# Patient Record
Sex: Male | Born: 1945 | Race: Black or African American | Hispanic: No | Smoking: Former smoker
Health system: Southern US, Community
[De-identification: ages and names within clinical notes are randomized; demographics above are authoritative.]

## PROBLEM LIST (undated history)

## (undated) DIAGNOSIS — K219 Gastro-esophageal reflux disease without esophagitis: Secondary | ICD-10-CM

## (undated) DIAGNOSIS — I10 Essential (primary) hypertension: Secondary | ICD-10-CM

## (undated) DIAGNOSIS — E7800 Pure hypercholesterolemia, unspecified: Secondary | ICD-10-CM

## (undated) DIAGNOSIS — I639 Cerebral infarction, unspecified: Secondary | ICD-10-CM

## (undated) DIAGNOSIS — N400 Enlarged prostate without lower urinary tract symptoms: Secondary | ICD-10-CM

## (undated) HISTORY — PX: NO PAST SURGERIES: SHX2092

---

## 2005-02-18 ENCOUNTER — Encounter: Admission: RE | Admit: 2005-02-18 | Discharge: 2005-02-18 | Payer: Self-pay | Admitting: Internal Medicine

## 2013-09-27 ENCOUNTER — Ambulatory Visit: Payer: Self-pay | Admitting: Gastroenterology

## 2016-06-06 ENCOUNTER — Encounter (HOSPITAL_COMMUNITY): Payer: Self-pay

## 2016-06-06 ENCOUNTER — Emergency Department (HOSPITAL_COMMUNITY)
Admission: EM | Admit: 2016-06-06 | Discharge: 2016-06-06 | Disposition: A | Discharge status: Home / Self Care | Payer: Medicare HMO | Attending: Emergency Medicine | Admitting: Emergency Medicine

## 2016-06-06 DIAGNOSIS — R112 Nausea with vomiting, unspecified: Secondary | ICD-10-CM | POA: Diagnosis present

## 2016-06-06 DIAGNOSIS — Z79899 Other long term (current) drug therapy: Secondary | ICD-10-CM | POA: Diagnosis not present

## 2016-06-06 DIAGNOSIS — I10 Essential (primary) hypertension: Secondary | ICD-10-CM | POA: Insufficient documentation

## 2016-06-06 HISTORY — DX: Essential (primary) hypertension: I10

## 2016-06-06 LAB — CBC
HCT: 45.8 % (ref 39.0–52.0)
Hemoglobin: 14.9 g/dL (ref 13.0–17.0)
MCH: 29.4 pg (ref 26.0–34.0)
MCHC: 32.5 g/dL (ref 30.0–36.0)
MCV: 90.3 fL (ref 78.0–100.0)
Platelets: 292 10*3/uL (ref 150–400)
RBC: 5.07 MIL/uL (ref 4.22–5.81)
RDW: 13.9 % (ref 11.5–15.5)
WBC: 9 10*3/uL (ref 4.0–10.5)

## 2016-06-06 LAB — COMPREHENSIVE METABOLIC PANEL
ALT: 29 U/L (ref 17–63)
AST: 27 U/L (ref 15–41)
Albumin: 3.8 g/dL (ref 3.5–5.0)
Alkaline Phosphatase: 60 U/L (ref 38–126)
Anion gap: 11 (ref 5–15)
BUN: 10 mg/dL (ref 6–20)
CHLORIDE: 104 mmol/L (ref 101–111)
CO2: 25 mmol/L (ref 22–32)
Calcium: 9.8 mg/dL (ref 8.9–10.3)
Creatinine, Ser: 1.27 mg/dL — ABNORMAL HIGH (ref 0.61–1.24)
GFR calc Af Amer: >60 mL/min (ref >60)
GFR, EST NON AFRICAN AMERICAN: 56 mL/min — AB (ref >60)
Glucose, Bld: 112 mg/dL — ABNORMAL HIGH (ref 65–99)
Potassium: 3.9 mmol/L (ref 3.5–5.1)
SODIUM: 140 mmol/L (ref 135–145)
Total Bilirubin: 0.7 mg/dL (ref 0.3–1.2)
Total Protein: 7.5 g/dL (ref 6.5–8.1)

## 2016-06-06 LAB — URINALYSIS, ROUTINE W REFLEX MICROSCOPIC
Bilirubin Urine: NEGATIVE
GLUCOSE, UA: NEGATIVE mg/dL
HGB URINE DIPSTICK: NEGATIVE
KETONES UR: NEGATIVE mg/dL
Nitrite: NEGATIVE
PH: 7 (ref 5.0–8.0)
Protein, ur: NEGATIVE mg/dL
Specific Gravity, Urine: 1.015 (ref 1.005–1.030)

## 2016-06-06 LAB — URINALYSIS, MICROSCOPIC (REFLEX): RBC / HPF: NONE SEEN RBC/hpf (ref 0–5)

## 2016-06-06 LAB — LIPASE, BLOOD: Lipase: 37 U/L (ref 11–51)

## 2016-06-06 MED ORDER — ONDANSETRON 4 MG PO TBDP
ORAL_TABLET | ORAL | Status: AC
  Filled 2016-06-06: qty 1

## 2016-06-06 MED ORDER — ACETAMINOPHEN 325 MG PO TABS
650.0000 mg | ORAL_TABLET | Freq: Once | ORAL | Status: AC
  Administered 2016-06-06: 650 mg via ORAL
  Filled 2016-06-06: qty 2

## 2016-06-06 MED ORDER — ONDANSETRON 4 MG PO TBDP
4.0000 mg | ORAL_TABLET | Freq: Once | ORAL | Status: AC | PRN
  Administered 2016-06-06: 4 mg via ORAL

## 2016-06-06 MED ORDER — ONDANSETRON 4 MG PO TBDP: 4.0000 mg | ORAL_TABLET | Freq: Three times a day (TID) | ORAL | 0 refills | Status: DC | PRN

## 2016-06-06 NOTE — Discharge Instructions (Signed)
Drink plenty of fluids (clear liquids) then start a bland diet later this morning such as toast, crackers, jello, [employer] chicken noodle soup. Use the zofran for nausea or vomiting.  Recheck if you get worse again. ° °

## 2016-06-06 NOTE — ED Provider Notes (Signed)
[facility]-EMERGENCY DEPT Provider Note   CSN: [id number] Arrival date & time: 06/06/16  0011  Time seen 04:20 AM   History   Chief Complaint Chief Complaint  Patient presents with  . Emesis    HPI Jordan [name] is a 71 y.o. male.  HPI  patient states he was fine until about 2 PM. He states he started having nausea and has had about 8-10 episodes of vomiting. He denies abdominal pain, diarrhea, cough, rhinorrhea, sore throat. He denies any change in his vision, numbness or tingling of his extremities. He denies lightheaded, dizzy, weak. He does complain of a headache behind his eyes. Patient was given a Zofran in triage and he states now his nausea is gone. He denies any known sick contacts. He did not eat anything different that could've made him ill.  PCP Dr Ann Lions in [place]  Past Medical History:  Diagnosis Date  . Hypertension     There are no active problems to display for this patient.   History reviewed. No pertinent surgical history.     Home Medications    Lisinopril crestor  Family History History reviewed. No pertinent family history.  Social History Social History  Substance Use Topics  . Smoking status: Never Smoker  . Smokeless tobacco: Never Used  . 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   Review of Systems Review of Systems  All other systems reviewed and are negative.    Physical Exam Updated Vital Signs BP 162/83   Pulse 79   Temp 99 F (37.2 C) (Oral)   Resp 18   SpO2 95%   Vital signs normal except hypertension   Physical Exam  Constitutional: He is oriented to person, place, and time. He appears well-developed and well-nourished.  Non-toxic appearance. He does not appear ill. No distress.  HENT:  Head: Normocephalic and atraumatic.  Right Ear: External ear normal.  Left Ear: External ear normal.  Nose: Nose normal. No mucosal edema or rhinorrhea.    Mouth/Throat: Oropharynx is clear and moist and mucous membranes are normal. No dental abscesses or uvula swelling.  Eyes: Conjunctivae and EOM are normal. Pupils are equal, round, and reactive to light.  Neck: Normal range of motion and full passive range of motion without pain. Neck supple.  Cardiovascular: Normal rate, regular rhythm and normal heart sounds.  Exam reveals no gallop and no friction rub.   No murmur heard. Pulmonary/Chest: Effort normal and breath sounds normal. No respiratory distress. He has no wheezes. He has no rhonchi. He has no rales. He exhibits no tenderness and no crepitus.  Abdominal: Soft. Normal appearance and bowel sounds are normal. He exhibits no distension. There is no tenderness. There is no rebound and no guarding.  Musculoskeletal: Normal range of motion. He exhibits no edema or tenderness.  Moves all extremities well.   Neurological: He is alert and oriented to person, place, and time. He has normal strength. No cranial nerve deficit.  Skin: Skin is warm, dry and intact. No rash noted. No erythema. No pallor.  Psychiatric: He has a normal mood and affect. His speech is normal and behavior is normal. His mood appears not anxious.  Nursing note and vitals reviewed.    ED Treatments / Results  Labs (all labs ordered are listed, but only abnormal results are displayed) Results for orders placed or performed during the hospital encounter of 06/06/16  Lipase, blood  Result Value Ref Range   Lipase 37 11 - 51 U/L  Comprehensive metabolic panel  Result Value Ref Range   Sodium 140 135 - 145 mmol/L   Potassium 3.9 3.5 - 5.1 mmol/L   Chloride 104 101 - 111 mmol/L   CO2 25 22 - 32 mmol/L   Glucose, Bld 112 (H) 65 - 99 mg/dL   BUN 10 6 - 20 mg/dL   Creatinine, Ser 1.91 (H) 0.61 - 1.24 mg/dL   Calcium 9.8 8.9 - 47.8 mg/dL   Total Protein 7.5 6.5 - 8.1 g/dL   Albumin 3.8 3.5 - 5.0 g/dL   AST 27 15 - 41 U/L   ALT 29 17 - 63 U/L   Alkaline Phosphatase 60  38 - 126 U/L   Total Bilirubin 0.7 0.3 - 1.2 mg/dL   GFR calc non Af Amer 56 (L) >60 mL/min   GFR calc Af Amer >60 >60 mL/min   Anion gap 11 5 - 15  CBC  Result Value Ref Range   WBC 9.0 4.0 - 10.5 K/uL   RBC 5.07 4.22 - 5.81 MIL/uL   Hemoglobin 14.9 13.0 - 17.0 g/dL   HCT 29.5 62.1 - 30.8 %   MCV 90.3 78.0 - 100.0 fL   MCH 29.4 26.0 - 34.0 pg   MCHC 32.5 30.0 - 36.0 g/dL   RDW 65.7 84.6 - 96.2 %   Platelets 292 150 - 400 K/uL  Urinalysis, Routine w reflex microscopic  Result Value Ref Range   Color, Urine YELLOW YELLOW   APPearance CLOUDY (A) CLEAR   Specific Gravity, Urine 1.015 1.005 - 1.030   pH 7.0 5.0 - 8.0   Glucose, UA NEGATIVE NEGATIVE mg/dL   Hgb urine dipstick NEGATIVE NEGATIVE   Bilirubin Urine NEGATIVE NEGATIVE   Ketones, ur NEGATIVE NEGATIVE mg/dL   Protein, ur NEGATIVE NEGATIVE mg/dL   Nitrite NEGATIVE NEGATIVE   Leukocytes, UA TRACE (A) NEGATIVE  Urinalysis, Microscopic (reflex)  Result Value Ref Range   RBC / HPF NONE SEEN 0 - 5 RBC/hpf   WBC, UA 0-5 0 - 5 WBC/hpf   Bacteria, UA RARE (A) NONE SEEN   Squamous Epithelial / LPF 0-5 (A) NONE SEEN   Mucous PRESENT    Hyaline Casts, UA PRESENT    Amorphous Crystal PRESENT    Laboratory interpretation all normal     EKG  EKG Interpretation None       Radiology No results found.  Procedures Procedures (including critical care time)  Medications Ordered in ED Medications  ondansetron (ZOFRAN-ODT) disintegrating tablet 4 mg (4 mg Oral Given 06/06/16 0022)  acetaminophen (TYLENOL) tablet 650 mg (650 mg Oral Given 06/06/16 0520)     Initial Impression / Assessment and Plan / ED Course  I have reviewed the triage vital signs and the nursing notes.  Pertinent labs & imaging results that were available during my care of the patient were reviewed by me and considered in my medical decision making (see chart for details).  Patient had been given Zofran in the lobby, by the time he came back to the ED  room he's feeling improved. He felt able to try oral fluids.  At time of discharge patient was able to drink fluids without any more nausea or vomiting. He was discharged home with a prescription for Zofran in case his nausea returns.  Final Clinical Impressions(s) / ED Diagnoses   Final diagnoses:  Non-intractable vomiting with nausea, unspecified vomiting type    New  Prescriptions New Prescriptions   ONDANSETRON (ZOFRAN ODT) 4 MG DISINTEGRATING TABLET    Take 1 tablet (4 mg total) by mouth every 8 (eight) hours as needed for nausea or vomiting.    Plan discharge  Devoria AlbeIva Marjory Meints, MD, Concha PyoFACEP    Sharhonda Atwood, MD 06/06/16 0600

## 2016-06-06 NOTE — ED Notes (Signed)
Pt departed in NAD, refused use of wheelchair.  

## 2016-06-06 NOTE — ED Triage Notes (Signed)
Pt complaining of nausea/vomiting x 3 today. Pt denies any abdominal pain. Pt denies any diarrhea. Pt complaining of general headache at triage. Pt denies any cough.

## 2017-10-23 ENCOUNTER — Encounter (HOSPITAL_COMMUNITY): Payer: Self-pay | Admitting: Emergency Medicine

## 2017-10-23 ENCOUNTER — Other Ambulatory Visit: Payer: Self-pay

## 2017-10-23 ENCOUNTER — Emergency Department (HOSPITAL_COMMUNITY): Payer: Medicare HMO

## 2017-10-23 ENCOUNTER — Inpatient Hospital Stay (HOSPITAL_COMMUNITY): Payer: Medicare HMO

## 2017-10-23 ENCOUNTER — Inpatient Hospital Stay (HOSPITAL_COMMUNITY)
Admission: EM | Admit: 2017-10-23 | Discharge: 2017-10-28 | DRG: 041 | Disposition: A | Discharge status: Home / Self Care | Payer: Medicare HMO | Attending: Internal Medicine | Admitting: Internal Medicine

## 2017-10-23 DIAGNOSIS — E669 Obesity, unspecified: Secondary | ICD-10-CM | POA: Diagnosis present

## 2017-10-23 DIAGNOSIS — R630 Anorexia: Secondary | ICD-10-CM | POA: Diagnosis present

## 2017-10-23 DIAGNOSIS — F339 Major depressive disorder, recurrent, unspecified: Secondary | ICD-10-CM | POA: Diagnosis not present

## 2017-10-23 DIAGNOSIS — Z7902 Long term (current) use of antithrombotics/antiplatelets: Secondary | ICD-10-CM | POA: Diagnosis not present

## 2017-10-23 DIAGNOSIS — I6389 Other cerebral infarction: Secondary | ICD-10-CM | POA: Diagnosis present

## 2017-10-23 DIAGNOSIS — H53462 Homonymous bilateral field defects, left side: Secondary | ICD-10-CM | POA: Diagnosis present

## 2017-10-23 DIAGNOSIS — I6522 Occlusion and stenosis of left carotid artery: Secondary | ICD-10-CM | POA: Diagnosis present

## 2017-10-23 DIAGNOSIS — Z8673 Personal history of transient ischemic attack (TIA), and cerebral infarction without residual deficits: Secondary | ICD-10-CM | POA: Diagnosis not present

## 2017-10-23 DIAGNOSIS — R278 Other lack of coordination: Secondary | ICD-10-CM | POA: Diagnosis present

## 2017-10-23 DIAGNOSIS — Z8249 Family history of ischemic heart disease and other diseases of the circulatory system: Secondary | ICD-10-CM

## 2017-10-23 DIAGNOSIS — I639 Cerebral infarction, unspecified: Secondary | ICD-10-CM | POA: Diagnosis present

## 2017-10-23 DIAGNOSIS — R7303 Prediabetes: Secondary | ICD-10-CM | POA: Diagnosis not present

## 2017-10-23 DIAGNOSIS — Z79899 Other long term (current) drug therapy: Secondary | ICD-10-CM | POA: Diagnosis not present

## 2017-10-23 DIAGNOSIS — I63511 Cerebral infarction due to unspecified occlusion or stenosis of right middle cerebral artery: Secondary | ICD-10-CM | POA: Diagnosis not present

## 2017-10-23 DIAGNOSIS — R270 Ataxia, unspecified: Secondary | ICD-10-CM | POA: Diagnosis present

## 2017-10-23 DIAGNOSIS — I739 Peripheral vascular disease, unspecified: Secondary | ICD-10-CM | POA: Diagnosis present

## 2017-10-23 DIAGNOSIS — Z6826 Body mass index (BMI) 26.0-26.9, adult: Secondary | ICD-10-CM | POA: Diagnosis not present

## 2017-10-23 DIAGNOSIS — R51 Headache: Secondary | ICD-10-CM | POA: Diagnosis present

## 2017-10-23 DIAGNOSIS — E7800 Pure hypercholesterolemia, unspecified: Secondary | ICD-10-CM | POA: Diagnosis present

## 2017-10-23 DIAGNOSIS — N400 Enlarged prostate without lower urinary tract symptoms: Secondary | ICD-10-CM | POA: Diagnosis present

## 2017-10-23 DIAGNOSIS — I63411 Cerebral infarction due to embolism of right middle cerebral artery: Secondary | ICD-10-CM | POA: Diagnosis not present

## 2017-10-23 DIAGNOSIS — I10 Essential (primary) hypertension: Secondary | ICD-10-CM | POA: Diagnosis present

## 2017-10-23 DIAGNOSIS — K219 Gastro-esophageal reflux disease without esophagitis: Secondary | ICD-10-CM | POA: Diagnosis present

## 2017-10-23 DIAGNOSIS — F329 Major depressive disorder, single episode, unspecified: Secondary | ICD-10-CM | POA: Diagnosis present

## 2017-10-23 DIAGNOSIS — I6300 Cerebral infarction due to thrombosis of unspecified precerebral artery: Secondary | ICD-10-CM | POA: Diagnosis not present

## 2017-10-23 DIAGNOSIS — H538 Other visual disturbances: Secondary | ICD-10-CM | POA: Diagnosis present

## 2017-10-23 DIAGNOSIS — I5030 Unspecified diastolic (congestive) heart failure: Secondary | ICD-10-CM | POA: Diagnosis not present

## 2017-10-23 DIAGNOSIS — Z7982 Long term (current) use of aspirin: Secondary | ICD-10-CM | POA: Diagnosis not present

## 2017-10-23 DIAGNOSIS — I700 Atherosclerosis of aorta: Secondary | ICD-10-CM | POA: Diagnosis present

## 2017-10-23 DIAGNOSIS — G4730 Sleep apnea, unspecified: Secondary | ICD-10-CM | POA: Diagnosis present

## 2017-10-23 DIAGNOSIS — I340 Nonrheumatic mitral (valve) insufficiency: Secondary | ICD-10-CM | POA: Diagnosis not present

## 2017-10-23 DIAGNOSIS — F321 Major depressive disorder, single episode, moderate: Secondary | ICD-10-CM

## 2017-10-23 DIAGNOSIS — Z87891 Personal history of nicotine dependence: Secondary | ICD-10-CM

## 2017-10-23 DIAGNOSIS — H5347 Heteronymous bilateral field defects: Secondary | ICD-10-CM | POA: Diagnosis not present

## 2017-10-23 DIAGNOSIS — Z823 Family history of stroke: Secondary | ICD-10-CM

## 2017-10-23 DIAGNOSIS — E785 Hyperlipidemia, unspecified: Secondary | ICD-10-CM | POA: Diagnosis present

## 2017-10-23 DIAGNOSIS — R29702 NIHSS score 2: Secondary | ICD-10-CM | POA: Diagnosis present

## 2017-10-23 DIAGNOSIS — Q2546 Tortuous aortic arch: Secondary | ICD-10-CM | POA: Diagnosis not present

## 2017-10-23 HISTORY — DX: Pure hypercholesterolemia, unspecified: E78.00

## 2017-10-23 HISTORY — DX: Cerebral infarction, unspecified: I63.9

## 2017-10-23 HISTORY — DX: Gastro-esophageal reflux disease without esophagitis: K21.9

## 2017-10-23 LAB — URINALYSIS, ROUTINE W REFLEX MICROSCOPIC
BILIRUBIN URINE: NEGATIVE
GLUCOSE, UA: NEGATIVE mg/dL
HGB URINE DIPSTICK: NEGATIVE
KETONES UR: NEGATIVE mg/dL
Leukocytes, UA: NEGATIVE
Nitrite: NEGATIVE
PROTEIN: NEGATIVE mg/dL
Specific Gravity, Urine: 1.02 (ref 1.005–1.030)
pH: 5 (ref 5.0–8.0)

## 2017-10-23 LAB — COMPREHENSIVE METABOLIC PANEL
ALK PHOS: 58 U/L (ref 38–126)
ALT: 16 U/L (ref 0–44)
AST: 19 U/L (ref 15–41)
Albumin: 3.5 g/dL (ref 3.5–5.0)
Anion gap: 12 (ref 5–15)
BUN: 9 mg/dL (ref 8–23)
CALCIUM: 9.4 mg/dL (ref 8.9–10.3)
CO2: 25 mmol/L (ref 22–32)
CREATININE: 1.26 mg/dL — AB (ref 0.61–1.24)
Chloride: 100 mmol/L (ref 98–111)
GFR, EST NON AFRICAN AMERICAN: 56 mL/min — AB (ref >60)
Glucose, Bld: 127 mg/dL — ABNORMAL HIGH (ref 70–99)
Potassium: 4.2 mmol/L (ref 3.5–5.1)
Sodium: 137 mmol/L (ref 135–145)
TOTAL PROTEIN: 7.2 g/dL (ref 6.5–8.1)
Total Bilirubin: 0.7 mg/dL (ref 0.3–1.2)

## 2017-10-23 LAB — CBC
HCT: 45.7 % (ref 39.0–52.0)
HEMOGLOBIN: 14.5 g/dL (ref 13.0–17.0)
MCH: 28.8 pg (ref 26.0–34.0)
MCHC: 31.7 g/dL (ref 30.0–36.0)
MCV: 90.7 fL (ref 78.0–100.0)
Platelets: ADEQUATE 10*3/uL (ref 150–400)
RBC: 5.04 MIL/uL (ref 4.22–5.81)
RDW: 13.2 % (ref 11.5–15.5)
WBC: 8.1 10*3/uL (ref 4.0–10.5)

## 2017-10-23 LAB — I-STAT CHEM 8, ED
BUN: 11 mg/dL (ref 8–23)
CREATININE: 1.1 mg/dL (ref 0.61–1.24)
Calcium, Ion: 1.15 mmol/L (ref 1.15–1.40)
Chloride: 99 mmol/L (ref 98–111)
GLUCOSE: 123 mg/dL — AB (ref 70–99)
HCT: 47 % (ref 39.0–52.0)
HEMOGLOBIN: 16 g/dL (ref 13.0–17.0)
Potassium: 4.1 mmol/L (ref 3.5–5.1)
Sodium: 135 mmol/L (ref 135–145)
TCO2: 26 mmol/L (ref 22–32)

## 2017-10-23 LAB — CBG MONITORING, ED
GLUCOSE-CAPILLARY: 118 mg/dL — AB (ref 70–99)
Glucose-Capillary: 123 mg/dL — ABNORMAL HIGH (ref 70–99)

## 2017-10-23 LAB — DIFFERENTIAL
Abs Immature Granulocytes: 0 10*3/uL (ref 0.0–0.1)
Basophils Absolute: 0 10*3/uL (ref 0.0–0.1)
Basophils Relative: 0 %
EOS PCT: 0 %
Eosinophils Absolute: 0 10*3/uL (ref 0.0–0.7)
Immature Granulocytes: 0 %
LYMPHS PCT: 20 %
Lymphs Abs: 1.6 10*3/uL (ref 0.7–4.0)
MONO ABS: 0.7 10*3/uL (ref 0.1–1.0)
MONOS PCT: 8 %
NEUTROS ABS: 5.8 10*3/uL (ref 1.7–7.7)
NEUTROS PCT: 72 %

## 2017-10-23 LAB — I-STAT TROPONIN, ED: TROPONIN I, POC: 0.01 ng/mL (ref 0.00–0.08)

## 2017-10-23 LAB — APTT: aPTT: 21 seconds — ABNORMAL LOW (ref 24–36)

## 2017-10-23 LAB — PROTIME-INR
INR: 0.98
Prothrombin Time: 12.9 seconds (ref 11.4–15.2)

## 2017-10-23 MED ORDER — STROKE: EARLY STAGES OF RECOVERY BOOK: Freq: Once | Status: DC

## 2017-10-23 MED ORDER — ACETAMINOPHEN 650 MG RE SUPP: 650.0000 mg | RECTAL | Status: DC | PRN

## 2017-10-23 MED ORDER — ASPIRIN 325 MG PO TABS
325.0000 mg | ORAL_TABLET | Freq: Every day | ORAL | Status: DC
  Administered 2017-10-23: 325 mg via ORAL
  Filled 2017-10-23: qty 1

## 2017-10-23 MED ORDER — SODIUM CHLORIDE 0.9 % IV SOLN
INTRAVENOUS | Status: AC
  Administered 2017-10-23: 21:00:00 via INTRAVENOUS

## 2017-10-23 MED ORDER — ROSUVASTATIN CALCIUM 20 MG PO TABS
20.0000 mg | ORAL_TABLET | Freq: Every day | ORAL | Status: DC
  Administered 2017-10-24 – 2017-10-27 (×4): 20 mg via ORAL
  Filled 2017-10-23 (×4): qty 1

## 2017-10-23 MED ORDER — ACETAMINOPHEN 325 MG PO TABS
650.0000 mg | ORAL_TABLET | ORAL | Status: DC | PRN
  Administered 2017-10-27 – 2017-10-28 (×2): 650 mg via ORAL
  Filled 2017-10-23 (×2): qty 2

## 2017-10-23 MED ORDER — ACETAMINOPHEN 160 MG/5ML PO SOLN: 650.0000 mg | ORAL | Status: DC | PRN

## 2017-10-23 MED ORDER — SENNOSIDES-DOCUSATE SODIUM 8.6-50 MG PO TABS: 1.0000 | ORAL_TABLET | Freq: Every evening | ORAL | Status: DC | PRN

## 2017-10-23 MED ORDER — TAMSULOSIN HCL 0.4 MG PO CAPS
0.4000 mg | ORAL_CAPSULE | Freq: Every day | ORAL | Status: DC
  Administered 2017-10-24 – 2017-10-27 (×4): 0.4 mg via ORAL
  Filled 2017-10-23 (×4): qty 1

## 2017-10-23 MED ORDER — ENOXAPARIN SODIUM 40 MG/0.4ML ~~LOC~~ SOLN
40.0000 mg | SUBCUTANEOUS | Status: DC
  Administered 2017-10-24 – 2017-10-26 (×3): 40 mg via SUBCUTANEOUS
  Filled 2017-10-23 (×3): qty 0.4
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
## 2017-10-23 NOTE — ED Notes (Signed)
Attempted to call report
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
## 2017-10-23 NOTE — ED Triage Notes (Signed)
Pt here for confusion. Pt complains of feeling dizzy and headache, starting Monday. Pt alert and oriented x3. Pt intialy answered 2018 for the year and the corrected himself. Pt's niece reports he showed up to work 2 hours early without knowing it. Pt is apparently dressed neat, pt's shirt buttons were uneven.

## 2017-10-23 NOTE — ED Notes (Signed)
CBG 118 
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
## 2017-10-23 NOTE — Progress Notes (Signed)
Pt arrived on the unit safely.

## 2017-10-24 ENCOUNTER — Inpatient Hospital Stay (HOSPITAL_COMMUNITY): Payer: Medicare HMO

## 2017-10-24 DIAGNOSIS — I5030 Unspecified diastolic (congestive) heart failure: Secondary | ICD-10-CM

## 2017-10-24 DIAGNOSIS — E785 Hyperlipidemia, unspecified: Secondary | ICD-10-CM

## 2017-10-24 DIAGNOSIS — Z7982 Long term (current) use of aspirin: Secondary | ICD-10-CM

## 2017-10-24 DIAGNOSIS — I63511 Cerebral infarction due to unspecified occlusion or stenosis of right middle cerebral artery: Secondary | ICD-10-CM

## 2017-10-24 DIAGNOSIS — I10 Essential (primary) hypertension: Secondary | ICD-10-CM

## 2017-10-24 DIAGNOSIS — Z7902 Long term (current) use of antithrombotics/antiplatelets: Secondary | ICD-10-CM

## 2017-10-24 DIAGNOSIS — Z79899 Other long term (current) drug therapy: Secondary | ICD-10-CM

## 2017-10-24 DIAGNOSIS — I639 Cerebral infarction, unspecified: Secondary | ICD-10-CM

## 2017-10-24 DIAGNOSIS — R7303 Prediabetes: Secondary | ICD-10-CM

## 2017-10-24 LAB — LIPID PANEL
Cholesterol: 123 mg/dL (ref 0–200)
HDL: 32 mg/dL — ABNORMAL LOW (ref >40)
LDL Cholesterol: 75 mg/dL (ref 0–99)
Total CHOL/HDL Ratio: 3.8 RATIO
Triglycerides: 80 mg/dL (ref <150)
VLDL: 16 mg/dL (ref 0–40)

## 2017-10-24 LAB — HEMOGLOBIN A1C
HEMOGLOBIN A1C: 6.4 % — AB (ref 4.8–5.6)
MEAN PLASMA GLUCOSE: 136.98 mg/dL

## 2017-10-24 LAB — BASIC METABOLIC PANEL
Anion gap: 6 (ref 5–15)
BUN: 9 mg/dL (ref 8–23)
CO2: 29 mmol/L (ref 22–32)
CREATININE: 1.18 mg/dL (ref 0.61–1.24)
Calcium: 9 mg/dL (ref 8.9–10.3)
Chloride: 101 mmol/L (ref 98–111)
GFR calc Af Amer: >60 mL/min (ref >60)
Glucose, Bld: 127 mg/dL — ABNORMAL HIGH (ref 70–99)
Potassium: 4.2 mmol/L (ref 3.5–5.1)
Sodium: 136 mmol/L (ref 135–145)

## 2017-10-24 LAB — RAPID URINE DRUG SCREEN, HOSP PERFORMED
AMPHETAMINES: NOT DETECTED
BENZODIAZEPINES: NOT DETECTED
Cocaine: NOT DETECTED
OPIATES: NOT DETECTED
TETRAHYDROCANNABINOL: NOT DETECTED

## 2017-10-24 LAB — ECHOCARDIOGRAM COMPLETE

## 2017-10-24 MED ORDER — ASPIRIN EC 81 MG PO TBEC
81.0000 mg | DELAYED_RELEASE_TABLET | Freq: Every day | ORAL | Status: DC
  Administered 2017-10-24 – 2017-10-28 (×5): 81 mg via ORAL
  Filled 2017-10-24 (×5): qty 1

## 2017-10-24 MED ORDER — IOPAMIDOL (ISOVUE-370) INJECTION 76%
50.0000 mL | Freq: Once | INTRAVENOUS | Status: AC | PRN
  Administered 2017-10-24: 50 mL via INTRAVENOUS

## 2017-10-24 MED ORDER — CLOPIDOGREL BISULFATE 75 MG PO TABS
75.0000 mg | ORAL_TABLET | Freq: Every day | ORAL | Status: DC
  Administered 2017-10-24 – 2017-10-28 (×5): 75 mg via ORAL
  Filled 2017-10-24 (×5): qty 1

## 2017-10-24 MED ORDER — PERFLUTREN LIPID MICROSPHERE
1.0000 mL | INTRAVENOUS | Status: AC | PRN
  Administered 2017-10-24: 2 mL via INTRAVENOUS
  Filled 2017-10-24: qty 10

## 2017-10-24 NOTE — Progress Notes (Signed)
    [facility] has been requested to perform a transesophageal echocardiogram on 10/27/17 for the evaluation of an embolic stroke.  After careful review of history and examination, the risks and benefits of transesophageal echocardiogram have been explained including risks of esophageal damage, perforation (1:10,000 risk), bleeding, pharyngeal hematoma as well as other potential complications associated with conscious sedation including aspiration, arrhythmia, respiratory failure and death. Alternatives to treatment were discussed, questions were answered. Patient is willing to proceed.   TEE scheduled for 10/27/17 at 8:00am with Dr. Leitha Schulleross Krista Kroeger, PA-C 10/24/2017 4:05 PM

## 2017-10-24 NOTE — Progress Notes (Signed)
Paged by nurse for hypertension, BP 188/171, MAP 178. On evaluation patient denied any chest pain, SOB, confusion, or vision changes. He did endorse a headache but states that this is not new or worsening. His lungs were CTA with normal effort. Given his recent CVA will allow for permissive hypertension and continue to monitor vitals.   Claudean SeveranceMarissa M Krienke, M.D. PGY1 Pager [phone] 10/24/2017 2:54 AM
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
## 2017-10-24 NOTE — Evaluation (Signed)
Physical Therapy Evaluation Patient Details Name: Jordan [name] MRN: [id number] DOB: 1945/05/05 Today's Date: 10/24/2017   History of Present Illness  Jordan [name] is an 72 y.o. male who presented to the ED with a 4 day history of headache, confusion, and feeling dizzy. MRI/CT head revealed a subacute right parietal lobe ischemic infarction. PMHx of HTN, HLD, and BPH.  Clinical Impression  Jordan [name] is a very pleasant 72 y.o male admitted with the above listed diagnosis. Prior to admission, patient reports he lived alone and worked full time as a Electrical engineer. Patient today requiring supervision to min guard for all mobility for general safety and stability. Did demonstrate 2 instances of instability, but did not require physical assist for steadying. When asking to weave through objects, difficulty navigating towards L side requiring verbal cueing and demonstration to perform correctly. PT recommending outpatient neuro PT at this time to progress functional mobility and balance.      Follow Up Recommendations Outpatient PT;Supervision - Intermittent(Neuro PT)    Equipment Recommendations  None recommended by PT    Recommendations for Other Services       Precautions / Restrictions Precautions Precautions: Fall Restrictions Weight Bearing Restrictions: No      Mobility  Bed Mobility Overal bed mobility: Modified Independent                Transfers Overall transfer level: Needs assistance Equipment used: None Transfers: Sit to/from Stand Sit to Stand: Min guard;Supervision         General transfer comment: for general safety and immediate standing balance  Ambulation/Gait Ambulation/Gait assistance: Min guard;Supervision Gait Distance (Feet): 150 Feet Assistive device: None Gait Pattern/deviations: Step-through pattern;Decreased stride length;Narrow base of support Gait velocity: decreased   General Gait Details: slow pace of movement, prefers a downward gaze;  MIn guard to supervision for safety, no LOB, but slightly unsteady  Stairs Stairs: Yes Stairs assistance: Min guard;Supervision Stair Management: Two rails;Alternating pattern;Forwards Number of Stairs: 2    Wheelchair Mobility    Modified Rankin (Stroke Patients Only) Modified Rankin (Stroke Patients Only) Pre-Morbid Rankin Score: No symptoms Modified Rankin: Moderately severe disability     Balance Overall balance assessment: Needs assistance Sitting-balance support: No upper extremity supported;Feet supported Sitting balance-Leahy Scale: Good     Standing balance support: No upper extremity supported;During functional activity Standing balance-Leahy Scale: Fair                   Standardized Balance Assessment Standardized Balance Assessment : Dynamic Gait Index   Dynamic Gait Index Level Surface: Mild Impairment Change in Gait Speed: Mild Impairment Gait with Horizontal Head Turns: Mild Impairment Gait with Vertical Head Turns: Mild Impairment Gait and Pivot Turn: Mild Impairment Step Over Obstacle: Normal Step Around Obstacles: Mild Impairment Steps: Mild Impairment Total Score: 17       Pertinent Vitals/Pain Pain Assessment: No/denies pain    Home Living Family/patient expects to be discharged to:: Private residence Living Arrangements: Alone   Type of Home: Apartment Home Access: Level entry     Home Layout: One level Home Equipment: None      Prior Function Level of Independence: Independent         Comments: drives; 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)     Higher education careers adviser   Dominant Hand: Right    Extremity/Trunk Assessment   Upper Extremity Assessment Upper Extremity Assessment: Defer to OT evaluation    Lower Extremity Assessment Lower Extremity Assessment: Generalized weakness  Cervical / Trunk Assessment Cervical / Trunk Assessment: Normal  Communication   Communication: No difficulties  Cognition  Arousal/Alertness: Awake/alert Behavior During Therapy: WFL for tasks assessed/performed Overall Cognitive Status: Within Functional Limits for tasks assessed                                        General Comments      Exercises     Assessment/Plan    PT Assessment Patient needs continued PT services  PT Problem List Decreased strength;Decreased activity tolerance;Decreased balance;Decreased mobility;Decreased coordination;Decreased cognition;Decreased knowledge of use of DME;Decreased safety awareness;Decreased knowledge of precautions       PT Treatment Interventions DME instruction;Stair training;Gait training;Functional mobility training;Therapeutic activities;Therapeutic exercise;Balance training;Cognitive remediation;Neuromuscular re-education;Patient/family education    PT Goals (Current goals can be found in the Care Plan section)  Acute Rehab PT Goals Patient Stated Goal: none stated PT Goal Formulation: With patient Time For Goal Achievement: 11/07/17 Potential to Achieve Goals: Good    Frequency Min 4X/week   Barriers to discharge        Co-evaluation               AM-PAC PT "6 Clicks" Daily Activity  Outcome Measure Difficulty turning over in bed (including adjusting bedclothes, sheets and blankets)?: A Little Difficulty moving from lying on back to sitting on the side of the bed? : A Little Difficulty sitting down on and standing up from a chair with arms (e.g., wheelchair, bedside commode, etc,.)?: Unable Help needed moving to and from a bed to chair (including a wheelchair)?: A Little Help needed walking in hospital room?: A Little Help needed climbing 3-5 steps with a railing? : A Little 6 Click Score: 16    End of Session Equipment Utilized During Treatment: Gait belt Activity Tolerance: Patient tolerated treatment well Patient left: in bed;with call bell/phone within reach Nurse Communication: Mobility status PT Visit  Diagnosis: Unsteadiness on feet (R26.81);Other abnormalities of gait and mobility (R26.89);Muscle weakness (generalized) (M62.81)    Time: [id number] PT Time Calculation (min) (ACUTE ONLY): 14 min   Charges:   PT Evaluation $PT Eval Moderate Complexity: 1 Mod     PT G Codes:        Kipp LaurenceStephanie R Aaron, PT, DPT 10/24/17 11:18 AM

## 2017-10-24 NOTE — Progress Notes (Signed)
Preliminary results by tech- Bilateral venous duplex completed. No evidence of DVT. Blanch MediaMegan Norelle Runnion 10/24/2017 9:45 AM

## 2017-10-24 NOTE — Progress Notes (Addendum)
OT Cancellation Note  Patient Details Name: Edmonia LynchJerry L Abed MRN: [id number] DOB: [birth year]   Cancelled Treatment:    Reason Eval/Treat Not Completed: Patient at procedure or test/ unavailable(Echo)  Evern BioLaura J Keirsten Matuska 10/24/2017, 9:41 AM  Sherryl MangesLaura Itati Brocksmith OTR/L [phone]  Attempted again 2:12 pm and Pt was at CT for imaging. Will continue to attempt evaluation.   Sherryl MangesLaura Tasheka Houseman OTR/L [phone]

## 2017-10-24 NOTE — Progress Notes (Signed)
   Subjective: Patient seen and evaluated at bedside. He states he is feeling better today. His headache has resolved and he is feeling less foggy headed. He passed his swallow screening and was sitting up eating breakfast when we saw him. Denies blurry vision, chest pain, palpitations, SOB, extremity weakness or changes in bowel habits.  Objective:  Vital signs in last 24 hours: Vitals:   10/24/17 0123 10/24/17 0330 10/24/17 0520 10/24/17 0808  BP: (!) 188/171 (!) 165/79 (!) 168/80 (!) 154/80  Pulse: 80 67 65 66  Resp: 18 18  20   Temp: 98.6 F (37 C) (!) 97.5 F (36.4 C) 98.3 F (36.8 C)   TempSrc: Axillary Axillary Axillary   SpO2: 96% 99% 100% 99%   Physical Exam General: A&O x3, pleasant male sitting up in bed eating breakfast in no acute distress. Cardiovascular: RRR without murmurs, gallops or rubs.  Respiratory: normal chest excursion. Lungs CTA in all fields.  Abdominal: bowel sounds +. Abdomen is soft, non-tender.  Ext: no bilateral lower extremity edema Neuro: CN II-XII grossly intact. Demonstrates 5/5 motor strength in bilateral upper and lower extremities. FNF on the left seems improved.   Lipid panel: HDL 32, LDL 75 A1C: 6.4  MRI: R parietal acute/early subacute infarct. Chronic lacunar and right paramedian frontal lobe infarcts.    Assessment/Plan:  Active Problems:   CVA (cerebral vascular accident) (HCC)  1. Right ischemic parietal stroke confirmed on MRI:  - telemetry demonstrates no evidence of arrhythmia on tele thus far - echo pending - carotid dopplers pending - A1C 6.4; will need repeat before classified as pre-diabetic - patient passed bedside swallow is now on cardiac diet - PT eval recommended outpatient PT; upon reading report, patient demonstrates left sided hemianopsia.  - Patient was on ASA at home so now on dual antiplatelet with ASA and plavix.   2. HTN - holding losartan 100 mg to allow permissive HTN. Patient's blood pressure has ranged  150s-180s systolic and 80-100 diastolic. There was one reading in patient's chart of 188/171 overnight which is likely a misread.   3. Hyperlipidemia  - well controlled on Atorvastatin.   Dispo: Anticipated discharge in approximately 1-2 day(s) pending CVA work-up.   Lenward ChancellorBloomfield, Carley D, DO 10/24/2017, 10:58 AM Pager: [phone]

## 2017-10-24 NOTE — Progress Notes (Signed)
PT Cancellation Note  Patient Details Name: Edmonia LynchJerry L Salameh MRN: [id number] DOB: 08-May-1945   Cancelled Treatment:    Reason Eval/Treat Not Completed: Patient at procedure or test/unavailable  At Echo. Will follow.  Kipp LaurenceStephanie R Aaron, PT, DPT 10/24/17 9:31 AM

## 2017-10-24 NOTE — Progress Notes (Signed)
BP = 188/171, MAP = 178. No PRN meds. [facility] on call notified.

## 2017-10-24 NOTE — Progress Notes (Signed)
  Date: 10/24/2017  Patient name: Jordan LynchJerry L Jordan [name]  Medical record number: [id number]  Date of birth: 1945/05/01   I have seen and evaluated Jordan Jordan [name] and discussed their care with the Residency Team. Jordan Jordan [name] is Jordan [name] 72 yo independent, working, community dwelling man with HTN and HLD. He developed sxs on 7/1 consisiting of HA, foggy headedness, not being able to complete work tasks, difficulty driving, bl;urry vision and anorexia.   PMHx, Fam Hx, and/or Soc Hx : Works as Jordan [name] Electrical engineersecurity guard. Drives. Lives independently.   Vitals:   10/24/17 0520 10/24/17 0808  BP: (!) 168/80 (!) 154/80  Pulse: 65 66  Resp:  20  Temp: 98.3 F (36.8 C)   SpO2: 100% 99%  RR 20 Gen eating breakfast indep HRRR no MRG ABD + BS, soft Skin warm and dry, no rashes Ext no edema Neuro per MS 3 Garba no deficits   HDL 32 LDL 75 A1C 6.4  MRI R parietal acute/early subacute infarct without hemorrhage. Chronic infarcts in R paramedian frontal lobe and lacunar infarcts in basal ganglia.   I personally viewed the EKG and confirmed my reading with the official read. Sinus, nl axis, LAE, no ischemic changes   Assessment and Plan: I have seen and evaluated the patient as outlined above. I agree with the formulated Assessment and Plan as detailed in the residents' note, with the following changes: Jordan Jordan [name] is Jordan [name] 72 yo independent, working, community dwelling man with HTN and HLD. He presented with Jordan [name] symptomatic R ischemic parietal stroke. He is receiving typical W/U and neuro has added LE dopplers. He had been on ASA at home so now on dual anti-plt with ASA and plavix. PT and OT pending to help anticipate D/C needs although I suspect physically, he will be safe to return home. Will need to ensure that mental status changes he noted since 7/1 have fully resolved.  1. ASA & plavix 2. Complete CVA W/U 3. Await PT / OT  Jordan Jordan [name], Jordan A, MD 7/5/201910:45 AM

## 2017-10-24 NOTE — Progress Notes (Signed)
Subjective: Overnight, patient has remained afebrile with a singular episode of moderately severe hypertension (188/171). Patient reports headache, lightheadedness and confusion have resolved. He has not had any recurrence of blurry vision, and patient has now initiated cardiac diet after passing swallowing trial. He reports appetite is excellent and denies any n/v/d. Patient also denies chest pain, palpitations, SOB, extremity weakness or changes in bowel habits. No other acute changes overnight.   Objective: Vital signs in last 24 hours: Vitals:   10/24/17 0123 10/24/17 0330 10/24/17 0520 10/24/17 0808  BP: (!) 188/171 (!) 165/79 (!) 168/80 (!) 154/80  Pulse: 80 67 65 66  Resp: 18 18  20   Temp: 98.6 F (37 C) (!) 97.5 F (36.4 C) 98.3 F (36.8 C)   TempSrc: Axillary Axillary Axillary   SpO2: 96% 99% 100% 99%   Weight change:   Intake/Output Summary (Last 24 hours) at 10/24/2017 0957 Last data filed at 10/23/2017 2048 Gross per 24 hour  Intake 240 ml  Output -  Net 240 ml   Physical Exam  Constitutional: He is oriented to person, place, and time. He appears well-developed and well-nourished. No distress.  Eyes: Pupils are equal, round, and reactive to light. Conjunctivae and EOM are normal.  Neck: Normal range of motion. Neck supple. No JVD present. No tracheal deviation present. No thyromegaly present.  Cardiovascular: Normal rate, regular rhythm, normal heart sounds and intact distal pulses. Exam reveals no gallop and no friction rub.  No murmur heard. Pulmonary/Chest: Effort normal and breath sounds normal. No stridor. No respiratory distress. He has no wheezes. He has no rales. He exhibits no tenderness.  Abdominal: Soft. Bowel sounds are normal. He exhibits no distension and no mass. There is no tenderness. There is no guarding.  Musculoskeletal: Normal range of motion. He exhibits no edema, tenderness or deformity.  Lymphadenopathy:    He has no cervical adenopathy.    Neurological: He is alert and oriented to person, place, and time. He displays normal reflexes. No cranial nerve deficit or sensory deficit. He exhibits normal muscle tone. Coordination normal.  Skin: Skin is warm and dry. Capillary refill takes less than 2 seconds. No rash noted. He is not diaphoretic. No erythema.  Psychiatric: He has a normal mood and affect. His behavior is normal. Judgment and thought content normal.   Lab Results:  BMP Latest Ref Rng & Units 10/24/2017 10/23/2017 10/23/2017  Glucose 70 - 99 mg/dL 478(G) 956(O) 130(Q)  BUN 8 - 23 mg/dL 9 11 9   Creatinine 0.61 - 1.24 mg/dL 6.57 8.46 9.62(X)  Sodium 135 - 145 mmol/L 136 135 137  Potassium 3.5 - 5.1 mmol/L 4.2 4.1 4.2  Chloride 98 - 111 mmol/L 101 99 100  CO2 22 - 32 mmol/L 29 - 25  Calcium 8.9 - 10.3 mg/dL 9.0 - 9.4   Urinalysis    Component Value Date/Time   COLORURINE YELLOW 10/23/2017 1649   APPEARANCEUR HAZY (A) 10/23/2017 1649   LABSPEC 1.020 10/23/2017 1649   PHURINE 5.0 10/23/2017 1649   GLUCOSEU NEGATIVE 10/23/2017 1649   HGBUR NEGATIVE 10/23/2017 1649   BILIRUBINUR NEGATIVE 10/23/2017 1649   KETONESUR NEGATIVE 10/23/2017 1649   PROTEINUR NEGATIVE 10/23/2017 1649   NITRITE NEGATIVE 10/23/2017 1649   LEUKOCYTESUR NEGATIVE 10/23/2017 1649    CBC    Component Value Date/Time   WBC 8.1 10/23/2017 1641   RBC 5.04 10/23/2017 1641   HGB 16.0 10/23/2017 1656   HCT 47.0 10/23/2017 1656   PLT  10/23/2017 1641  PLATELET CLUMPS NOTED ON SMEAR, COUNT APPEARS ADEQUATE   MCV 90.7 10/23/2017 1641   MCH 28.8 10/23/2017 1641   MCHC 31.7 10/23/2017 1641   RDW 13.2 10/23/2017 1641   LYMPHSABS 1.6 10/23/2017 1641   MONOABS 0.7 10/23/2017 1641   EOSABS 0.0 10/23/2017 1641   BASOSABS 0.0 10/23/2017 1641      Micro Results: No results found for this or any previous visit (from the past 240 hour(s)). Studies/Results: Ct Head Wo Contrast  Result Date: 10/23/2017 CLINICAL DATA:  Confusion. Dizziness and  headache starting 3 days ago. EXAM: CT HEAD WITHOUT CONTRAST TECHNIQUE: Contiguous axial images were obtained from the base of the skull through the vertex without intravenous contrast. COMPARISON:  02/18/2005 FINDINGS: Brain: There is a wedge-shaped area of hypoattenuation in the right parietal lobe, involving the corticomedullary junction. This is consistent with a recent infarct, likely subacute. No other evidence of a recent infarction. There is encephalomalacia along the medial right frontal lobe consistent with an old infarct. There is an old lacune infarct in the anterior left basal ganglia. A small area of old infarction is noted within the left posterior occipital lobe. The ventricles are normal in size, for this patient's age, and normal in configuration. There are patchy areas of white matter hypoattenuation bilaterally consistent with moderate chronic microvascular ischemic change. There are no parenchymal masses. No significant mass effect or midline shift. There are no extra-axial masses. No intracranial hemorrhage. Vascular: No hyperdense vessel or unexpected calcification. Skull: Normal. Negative for fracture or focal lesion. Sinuses/Orbits: Globes and orbits are unremarkable. Sinuses are clear. Other: None. IMPRESSION: 1. Subacute right parietal lobe infarct. 2. No other recent abnormality. 3. Old infarcts and moderate chronic microvascular ischemic change. 4. No intracranial hemorrhage. Electronically Signed   By: Amie Portland M.D.   On: 10/23/2017 17:18   Mr Brain Wo Contrast  Result Date: 10/24/2017 CLINICAL DATA:  72 y/o M; headache and confusion. Evaluation for stroke. EXAM: MRI HEAD WITHOUT CONTRAST MRA HEAD WITHOUT CONTRAST TECHNIQUE: Multiplanar, multiecho pulse sequences of the brain and surrounding structures were obtained without intravenous contrast. Angiographic images of the head were obtained using MRA technique without contrast. COMPARISON:  10/23/2017 CT head. FINDINGS: MRI HEAD  FINDINGS Brain: Right parietal lobe focus of reduced diffusion measuring 2.8 x 4.4 x 2.3 cm (volume = 15 cm^3)(AP x ML x CC series 5, image 66 and series 7, image 46) compatible with acute/early subacute infarction. The area of diffusion signal abnormality is stable in distribution in comparison with prior CT of head given differences in technique. No associated hemorrhage or significant mass effect. Right paramedian frontal lobe chronic infarction. Chronic lacunar infarcts are present within the left thalamus and left anterior caudate head/putamen. Patchy confluent nonspecific foci of T2 FLAIR hyperintense signal abnormality in subcortical and periventricular white matter are compatible with moderate chronic microvascular ischemic changes for age. Moderate brain parenchymal volume loss. Numerous punctate foci of susceptibility hypointensity are present scattered throughout the brain in both central and peripheral distribution compatible with hemosiderin deposition of chronic microhemorrhage. Vascular: As below. Skull and upper cervical spine: Normal marrow signal. Sinuses/Orbits: Negative. Other: None. MRA HEAD FINDINGS Internal carotid arteries: Patent. Irregularity of carotid siphons without significant stenosis compatible with atherosclerotic disease. Anterior cerebral arteries:  Patent. Middle cerebral arteries: Patent. Anterior communicating artery: Patent. Posterior communicating arteries:  Patent. Posterior cerebral arteries:  Patent.  Mild left P2 stenosis. Basilar artery:  Patent.  Mild mid basilar stenosis. Vertebral arteries: Patent. Right dominant. Mild stenosis  in distal V4 segments. No evidence of high-grade stenosis, large vessel occlusion, or aneurysm identified. IMPRESSION: MRI head: 1. Right parietal acute/early subacute infarction measuring up to 4.4 cm, 15 cc. No associated hemorrhage or mass effect. 2. Moderate chronic microvascular ischemic changes and parenchymal volume loss of the brain. 3.  Small chronic infarct in the right paramedian frontal lobe. Several small chronic lacunar infarcts in basal ganglia. 4. Numerous nonspecific foci of chronic microhemorrhage diffusely throughout the brain probably related to chronic hypertension, less likely amyloid vasculopathy. Additionally, sequelae of traumatic brain injury, granulomatous disease, or vasculitis/vasculopathy could have this appearance. MRA head: Patent anterior and posterior intracranial circulation. No large vessel occlusion, high-grade stenosis, or aneurysm. Atherosclerosis with multiple segments of mild stenosis. Electronically Signed   By: Mitzi HansenLance  Furusawa-Stratton M.D.   On: 10/24/2017 00:19   Mr Shirlee LatchMra Head ZOWo Contrast  Result Date: 10/24/2017 CLINICAL DATA:  72 y/o M; headache and confusion. Evaluation for stroke. EXAM: MRI HEAD WITHOUT CONTRAST MRA HEAD WITHOUT CONTRAST TECHNIQUE: Multiplanar, multiecho pulse sequences of the brain and surrounding structures were obtained without intravenous contrast. Angiographic images of the head were obtained using MRA technique without contrast. COMPARISON:  10/23/2017 CT head. FINDINGS: MRI HEAD FINDINGS Brain: Right parietal lobe focus of reduced diffusion measuring 2.8 x 4.4 x 2.3 cm (volume = 15 cm^3)(AP x ML x CC series 5, image 66 and series 7, image 46) compatible with acute/early subacute infarction. The area of diffusion signal abnormality is stable in distribution in comparison with prior CT of head given differences in technique. No associated hemorrhage or significant mass effect. Right paramedian frontal lobe chronic infarction. Chronic lacunar infarcts are present within the left thalamus and left anterior caudate head/putamen. Patchy confluent nonspecific foci of T2 FLAIR hyperintense signal abnormality in subcortical and periventricular white matter are compatible with moderate chronic microvascular ischemic changes for age. Moderate brain parenchymal volume loss. Numerous punctate foci  of susceptibility hypointensity are present scattered throughout the brain in both central and peripheral distribution compatible with hemosiderin deposition of chronic microhemorrhage. Vascular: As below. Skull and upper cervical spine: Normal marrow signal. Sinuses/Orbits: Negative. Other: None. MRA HEAD FINDINGS Internal carotid arteries: Patent. Irregularity of carotid siphons without significant stenosis compatible with atherosclerotic disease. Anterior cerebral arteries:  Patent. Middle cerebral arteries: Patent. Anterior communicating artery: Patent. Posterior communicating arteries:  Patent. Posterior cerebral arteries:  Patent.  Mild left P2 stenosis. Basilar artery:  Patent.  Mild mid basilar stenosis. Vertebral arteries: Patent. Right dominant. Mild stenosis in distal V4 segments. No evidence of high-grade stenosis, large vessel occlusion, or aneurysm identified. IMPRESSION: MRI head: 1. Right parietal acute/early subacute infarction measuring up to 4.4 cm, 15 cc. No associated hemorrhage or mass effect. 2. Moderate chronic microvascular ischemic changes and parenchymal volume loss of the brain. 3. Small chronic infarct in the right paramedian frontal lobe. Several small chronic lacunar infarcts in basal ganglia. 4. Numerous nonspecific foci of chronic microhemorrhage diffusely throughout the brain probably related to chronic hypertension, less likely amyloid vasculopathy. Additionally, sequelae of traumatic brain injury, granulomatous disease, or vasculitis/vasculopathy could have this appearance. MRA head: Patent anterior and posterior intracranial circulation. No large vessel occlusion, high-grade stenosis, or aneurysm. Atherosclerosis with multiple segments of mild stenosis. Electronically Signed   By: Mitzi HansenLance  Furusawa-Stratton M.D.   On: 10/24/2017 00:19   Medications: I have reviewed the patient's current medications. Scheduled Meds: .  stroke: mapping our early stages of recovery book   Does not  apply Once  . aspirin EC  81 mg Oral Daily  . clopidogrel  75 mg Oral Daily  . enoxaparin (LOVENOX) injection  40 mg Subcutaneous Q24H  . rosuvastatin  20 mg Oral q1800  . tamsulosin  0.4 mg Oral Q supper   Continuous Infusions: PRN Meds:.acetaminophen **OR** acetaminophen (TYLENOL) oral liquid 160 mg/5 mL **OR** acetaminophen, senna-docusate Assessment/Plan: Active Problems:   CVA (cerebral vascular accident) (HCC)  1. CVA - CT confirms subacute R parietal infarct.  -MRI head w/o contrast - R parietal subacute infarction w chronic microvascular ischemic changes // small chronic infarct in right paramedian frontal lobe -CT angio neck ordered, results pending - on continuous telemetry to monitor for arrhythmia  - 2D echo performed to assess for thrombotic etiology - Bilateral venous duplex completed - no evidence of DVT - Lipid profile unremarkable. A1C of 6.4, possible pre-Diabetes - PT/OT eval pending - Cardiac diet initiated   2. HTN - hold losartan 100 mg  3. HLD - continue Crestor 20 mg  4. BPH - continue Flomax 0.4 mg  NGE:XBMWUXL diet intitated  VTE KGM:WNUUVOZ Code Status: FULL   This is a Psychologist, occupational Note.  The care of the patient was discussed with Dr. Rogelia Boga and the assessment and plan formulated with their assistance.  Please see their attached note for official documentation of the daily encounter.   LOS: 1 day   Margret Chance, Medical Student 10/24/2017, 9:57 AM

## 2017-10-24 NOTE — Evaluation (Signed)
Occupational Therapy Evaluation Patient Details Name: Jordan [name] MRN: [id number] DOB: 01/18/1946 Today's Date: 10/24/2017    History of Present Illness Jordan [name] is an 72 y.o. [name] who presented to the ED with a 4 day history of headache, confusion, and feeling dizzy. MRI/CT head revealed a subacute right parietal lobe ischemic infarction. PMHx of HTN, HLD, and BPH.   Clinical Impression   PTA Pt independent in ADL and mobility, worked full time in Office managersecurity, drives. Pt is currently supervision for mobility in room, min guard for ADL. Pt with decreased fine motor coordination and minor ataxia in LUE when put in reaching positions. Pt with left field deficit, especially noted in peripheral vision. (see vision section below) Pt with decreased awareness of deficits and requiring increased cues for problem solving this session. OT will continue to follow acutely with focus on fine motor HEP for LUE and visual compensatory strategies. Recommend Neuro OPOT follow up.     Follow Up Recommendations  Outpatient OT (neuro);Supervision - Intermittent    Equipment Recommendations  Tub/shower seat    Recommendations for Other Services       Precautions / Restrictions Precautions Precautions: Fall Restrictions Weight Bearing Restrictions: No      Mobility Bed Mobility Overal bed mobility: Modified Independent                Transfers Overall transfer level: Needs assistance Equipment used: None Transfers: Sit to/from Stand Sit to Stand: Supervision         General transfer comment: supervision for safety    Balance Overall balance assessment: Needs assistance Sitting-balance support: No upper extremity supported;Feet supported Sitting balance-Leahy Scale: Good     Standing balance support: No upper extremity supported;During functional activity Standing balance-Leahy Scale: Fair                             ADL either performed or assessed with clinical  judgement   ADL Overall ADL's : Needs assistance/impaired Eating/Feeding: Independent   Grooming: Wash/dry hands;Oral care;Min guard;Standing Grooming Details (indicate cue type and reason): sink level, vc for finding items on counter (in tray) Upper Body Bathing: Supervision/ safety;Sitting   Lower Body Bathing: Supervison/ safety;Sit to/from stand   Upper Body Dressing : Modified independent   Lower Body Dressing: Min guard;Sit to/from stand Lower Body Dressing Details (indicate cue type and reason): able to don socks sitting EOB Toilet Transfer: Supervision/safety;Ambulation;Comfort height toilet Toilet Transfer Details (indicate cue type and reason): in bathroom Toileting- Clothing Manipulation and Hygiene: Supervision/safety;Sit to/from stand       Functional mobility during ADLs: Min guard       Vision Patient Visual Report: Other (comment)(return to normal) Vision Assessment?: Yes Eye Alignment: Within Functional Limits Ocular Range of Motion: Within Functional Limits Alignment/Gaze Preference: Within Defined Limits Tracking/Visual Pursuits: Decreased smoothness of horizontal tracking Convergence: Within functional limits Visual Fields: Left visual field deficit;Impaired-to be further tested in functional context Diplopia Assessment: (denies) Additional Comments: notes from Neurology state: L Crescent[place] Visual field cut; peripheral vision on the left is impaired and in the superior quadrant     Perception     Praxis      Pertinent Vitals/Pain Pain Assessment: No/denies pain     Hand Dominance Right   Extremity/Trunk Assessment Upper Extremity Assessment Upper Extremity Assessment: LUE deficits/detail LUE Deficits / Details: grasp 5/5, full ROM, ataxic in distal positions, unable to perform finger to thumb due to coordination LUE  Sensation: WNL LUE Coordination: decreased fine motor   Lower Extremity Assessment Lower Extremity Assessment: Defer to PT  evaluation   Cervical / Trunk Assessment Cervical / Trunk Assessment: Normal   Communication Communication Communication: No difficulties   Cognition Arousal/Alertness: Awake/alert Behavior During Therapy: WFL for tasks assessed/performed Overall Cognitive Status: Impaired/Different from baseline Area of Impairment: Safety/judgement;Problem solving                         Safety/Judgement: Decreased awareness of deficits;Decreased awareness of safety   Problem Solving: Slow processing;Difficulty sequencing;Requires verbal cues;Requires tactile cues     General Comments  Sister and Brother present during session    Exercises     Shoulder Instructions      Home Living Family/patient expects to be discharged to:: Private residence Living Arrangements: Alone Available Help at Discharge: Family;Available PRN/intermittently Type of Home: Apartment Home Access: Level entry     Home Layout: One level     Bathroom Shower/Tub: Chief Strategy Officer: Standard     Home Equipment: None      Lives With: Alone    Prior Functioning/Environment Level of Independence: Independent        Comments: drives; 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)        OT Problem List: Impaired balance (sitting and/or standing);Impaired vision/perception;Decreased coordination;Decreased cognition;Decreased safety awareness;Impaired UE functional use      OT Treatment/Interventions: Self-care/ADL training;Neuromuscular education;DME and/or AE instruction;Therapeutic activities;Cognitive remediation/compensation;Visual/perceptual remediation/compensation;Patient/family education;Balance training    OT Goals(Current goals can be found in the care plan section) Acute Rehab OT Goals Patient Stated Goal: to get back to work OT Goal Formulation: With patient Time For Goal Achievement: 11/07/17 Potential to Achieve Goals: Good ADL Goals Pt Will Perform Grooming: with  modified independence;standing Pt Will Perform Lower Body Dressing: with modified independence;sit to/from stand Pt Will Transfer to Toilet: with modified independence;ambulating Pt Will Perform Toileting - Clothing Manipulation and hygiene: with modified independence;sit to/from stand Additional ADL Goal #1: Pt will recall 3 visual strategies for deficits at indpendent level Additional ADL Goal #2: Pt will perform fine motor coordination HEP for Lhand at independent level  OT Frequency: Min 2X/week   Barriers to D/C:    Pt lives alone       Co-evaluation              AM-PAC PT "6 Clicks" Daily Activity     Outcome Measure Help from another person eating meals?: None Help from another person taking care of personal grooming?: A Little Help from another person toileting, which includes using toliet, bedpan, or urinal?: A Little Help from another person bathing (including washing, rinsing, drying)?: A Little Help from another person to put on and taking off regular upper body clothing?: None Help from another person to put on and taking off regular lower body clothing?: A Little 6 Click Score: 20   End of Session Equipment Utilized During Treatment: Gait belt Nurse Communication: Mobility status  Activity Tolerance: Patient tolerated treatment well Patient left: in bed;with call bell/phone within reach;with family/visitor present;Other (comment)(Cardiology PA in room)  OT Visit Diagnosis: Unsteadiness on feet (R26.81);Low vision, both eyes (H54.2);Hemiplegia and hemiparesis Hemiplegia - Right/Left: Left Hemiplegia - dominant/non-dominant: Non-Dominant Hemiplegia - caused by: Cerebral infarction                Time: 1546-1610 OT Time Calculation (min): 24 min Charges:  OT General Charges $OT Visit: 1 Visit OT Evaluation $  OT Eval Moderate Complexity: 1 Mod OT Treatments $Self Care/Home Management : 8-22 mins G-Codes:     Sherryl Manges OTR/L [phone]  Evern Bio  Verle Brillhart 10/24/2017, 4:30 PM

## 2017-10-24 NOTE — Progress Notes (Signed)
  Echocardiogram 2D Echocardiogram has been performed.  Tye SavoyCasey N Otha Rickles 10/24/2017, 9:49 AM

## 2017-10-24 NOTE — Evaluation (Signed)
Speech Language Pathology Evaluation Patient Details Name: Jordan [name] MRN: [id number] DOB: 02/16/1946 Today's Date: 10/24/2017 Time: [id number] SLP Time Calculation (min) (ACUTE ONLY): 38 min  Problem List:  Patient Active Problem List   Diagnosis Date Noted  . CVA (cerebral vascular accident) (HCC) 10/23/2017   Past Medical History:  Past Medical History:  Diagnosis Date  . CVA (cerebral vascular accident) (HCC) 10/23/2017   CT confirms subacute R parietal infarct  . GERD (gastroesophageal reflux disease)   . High cholesterol   . Hypertension   . Sleep apnea    "wouldn't let them test me" (10/23/2017)   Past Surgical History:  Past Surgical History:  Procedure Laterality Date  . NO PAST SURGERIES     HPI:  Jordan [name] is an 72 y.o. male who presented to the ED with a 4 day history of headache, confusion, and feeling dizzy. MRI/CT head revealed a subacute right parietal lobe ischemic infarction. PMHx of HTN, HLD, and BPH.   Assessment / Plan / Recommendation Clinical Impression   Pt presents with grossly intact cognitive-linguistic function.  Pt's speech is fluent and free from dysarthria or word finding deficits.  CN exam is unremarkable for areas of focal weakness or decreased sensation.  Pt's cognition is at baseline per pt and family members.  SLP discussed pt's risk factors for stroke recurrence and provided pt with skilled education regarding BE FAST acronym to more quickly identify stroke symptoms given that pt waited several days to come to the hospital.  All questions were answered to pt's and family's satisfaction at this time.  No further ST needs indicated.     SLP Assessment  SLP Recommendation/Assessment: Patient does not need any further Speech Lanaguage Pathology Services    Follow Up Recommendations  None          SLP Evaluation Cognition  Overall Cognitive Status: Within Functional Limits for tasks assessed Orientation Level: Oriented X4        Comprehension  Auditory Comprehension Overall Auditory Comprehension: Appears within functional limits for tasks assessed    Expression Expression Primary Mode of Expression: Verbal Verbal Expression Overall Verbal Expression: Appears within functional limits for tasks assessed   Oral / Motor  Oral Motor/Sensory Function Overall Oral Motor/Sensory Function: Within functional limits Motor Speech Overall Motor Speech: Appears within functional limits for tasks assessed   GO                    Persais Ethridge, Melanee SpryNicole L 10/24/2017, 3:06 PM

## 2017-10-25 MED ORDER — LOSARTAN POTASSIUM 50 MG PO TABS
100.0000 mg | ORAL_TABLET | Freq: Every day | ORAL | Status: DC
  Administered 2017-10-26 – 2017-10-28 (×3): 100 mg via ORAL
  Filled 2017-10-25 (×3): qty 2

## 2017-10-25 NOTE — Progress Notes (Signed)
Physical Therapy Treatment Patient Details Name: Jordan [name] Ebner MRN: [id number] DOB: 07/03/1945 Today's Date: 10/25/2017    History of Present Illness Jordan [name] Fyfe is a 72 y.o. male who presented to the ED with a 4 day history of headache, confusion, and feeling dizzy. MRI/CT head revealed a subacute right parietal lobe ischemic infarction. PMHx of HTN, HLD, and BPH.    PT Comments    Pt is progressing well towards goals. Today's skilled session focused on sit<>stand for neuromuscular re-education and gait while scanning environment and calling out room numbers. Patient demonstrated some instability and [name] visual deficits. He would benefit from continued PT to maximize safety with mobility. Current plan remains appropriate.    Follow Up Recommendations  Outpatient PT;Supervision - Intermittent(Neuro PT)     Equipment Recommendations  None recommended by PT    Recommendations for Other Services       Precautions / Restrictions Precautions Precautions: Fall Restrictions Weight Bearing Restrictions: No    Mobility  Bed Mobility Overal bed mobility: Modified Independent Bed Mobility: Supine to Sit     Supine to sit: Supervision        Transfers Overall transfer level: Needs assistance Equipment used: None Transfers: Sit to/from Stand Sit to Stand: Supervision;Min assist         General transfer comment: Sit<>stand performed 10x for neuromuscular re-ed. First 5 trials pt utalized hands for lift, last 5 trials pt transfered w/o UE assist. Pt required min A to steady w/o use of UEs.  Ambulation/Gait Ambulation/Gait assistance: Min guard Gait Distance (Feet): 350 Feet Assistive device: None Gait Pattern/deviations: Step-through pattern;Decreased stride length;Narrow base of support     General Gait Details: pt performed gait while scanning enviroment and reading off room numbers from signs. Pt required occasional cueing to scan [name] side and occasional stumble noted when  looking [name], however no assist required to steady.   Stairs             Wheelchair Mobility    Modified Rankin (Stroke Patients Only) Modified Rankin (Stroke Patients Only) Pre-Morbid Rankin Score: No symptoms Modified Rankin: Moderately severe disability     Balance Overall balance assessment: Needs assistance Sitting-balance support: No upper extremity supported;Feet supported Sitting balance-Leahy Scale: Good     Standing balance support: No upper extremity supported;During functional activity Standing balance-Leahy Scale: Fair                              Cognition Arousal/Alertness: Awake/alert Behavior During Therapy: WFL for tasks assessed/performed Overall Cognitive Status: No family/caregiver present to determine baseline cognitive functioning Area of Impairment: Problem solving;Memory                     Memory: Decreased short-term memory       Problem Solving: Slow processing General Comments: pt attempted to go in bathroom to spit out toothpaste versus at sink. Took a little while to locate items.      Exercises Other Exercises Other Exercises: performed fine motor exercises with Lt hand.    General Comments        Pertinent Vitals/Pain Pain Assessment: No/denies pain    Home Living                      Prior Function            PT Goals (current goals can now be found in the care plan section)  Acute Rehab PT Goals Patient Stated Goal: not stated PT Goal Formulation: With patient Time For Goal Achievement: 11/07/17 Potential to Achieve Goals: Good Progress towards PT goals: Progressing toward goals    Frequency    Min 4X/week      PT Plan Current plan remains appropriate    Co-evaluation              AM-PAC PT "6 Clicks" Daily Activity  Outcome Measure  Difficulty turning over in bed (including adjusting bedclothes, sheets and blankets)?: None Difficulty moving from lying on back to  sitting on the side of the bed? : None Difficulty sitting down on and standing up from a chair with arms (e.g., wheelchair, bedside commode, etc,.)?: Unable Help needed moving to and from a bed to chair (including a wheelchair)?: A Little Help needed walking in hospital room?: A Little Help needed climbing 3-5 steps with a railing? : A Little 6 Click Score: 18    End of Session Equipment Utilized During Treatment: Gait belt Activity Tolerance: Patient tolerated treatment well Patient left: in bed;with call bell/phone within reach Nurse Communication: Mobility status PT Visit Diagnosis: Unsteadiness on feet (R26.81);Other abnormalities of gait and mobility (R26.89);Muscle weakness (generalized) (M62.81)     Time: 1350-1402 PT Time Calculation (min) (ACUTE ONLY): 12 min  Charges:  $Neuromuscular Re-education: 8-22 mins                    G Codes:       Kallie Locks, [place] Pager [phone] Acute Rehab   Sheral Apley 10/25/2017, 2:23 PM
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
## 2017-10-25 NOTE — Progress Notes (Signed)
Subjective: Overnight, patient remained afebrile and hemodynamically stable. Patient endorses complete resolution of symptoms. Appetite excellent and patient denies n/v/d. Patient also denies chest pain, dizziness, lightheadedness, SOB, extremity weakness or change in bowel habits. No other acute changes overnight.   Objective: Vital signs in last 24 hours: Vitals:   10/24/17 2020 10/24/17 2344 10/25/17 0416 10/25/17 0719  BP: (!) 162/85 (!) 160/66 (!) 167/72 (!) 149/65  Pulse: 64 62 63 62  Resp: 16 16 16 16   Temp: 98.5 F (36.9 C) 98.3 F (36.8 C) 98 F (36.7 C) 98 F (36.7 C)  TempSrc: Oral Oral Oral Oral  SpO2: 99% 100% 99% 99%   Weight change:  No intake or output data in the 24 hours ending 10/25/17 0951 Physical Exam  Constitutional: He is oriented to person, place, and time and well-developed, well-nourished, and in no distress. No distress.  Eyes: Pupils are equal, round, and reactive to light. Conjunctivae and EOM are normal.  Neck: Normal range of motion. Neck supple. No JVD present. No tracheal deviation present. No thyromegaly present.  Cardiovascular: Normal rate, regular rhythm and intact distal pulses. Exam reveals no gallop and no friction rub.  No murmur heard. Pulmonary/Chest: Effort normal and breath sounds normal. No stridor. No respiratory distress. He has no wheezes. He has no rales. He exhibits no tenderness.  Abdominal: Soft. Bowel sounds are normal. He exhibits no distension. There is no tenderness. There is no rebound and no guarding.  Musculoskeletal: Normal range of motion. He exhibits no edema, tenderness or deformity.  Lymphadenopathy:    He has no cervical adenopathy.  Neurological: He is alert and oriented to person, place, and time. He has normal reflexes. He displays normal reflexes. No cranial nerve deficit. He exhibits normal muscle tone. Gait normal. Coordination normal.  There is mild right hemianopsia elicited on visual field examination    Skin: Skin is warm and dry. No rash noted. He is not diaphoretic. No erythema. No pallor.    Lab Results:  Lipid Panel     Component Value Date/Time   CHOL 123 10/24/2017 0357   TRIG 80 10/24/2017 0357   HDL 32 (L) 10/24/2017 0357   CHOLHDL 3.8 10/24/2017 0357   VLDL 16 10/24/2017 0357   LDLCALC 75 10/24/2017 0357   BMP BMP Latest Ref Rng & Units 10/24/2017 10/23/2017 10/23/2017  Glucose 70 - 99 mg/dL 161(W) 960(A) 540(J)  BUN 8 - 23 mg/dL 9 11 9   Creatinine 0.61 - 1.24 mg/dL 8.11 9.14 7.82(N)  Sodium 135 - 145 mmol/L 136 135 137  Potassium 3.5 - 5.1 mmol/L 4.2 4.1 4.2  Chloride 98 - 111 mmol/L 101 99 100  CO2 22 - 32 mmol/L 29 - 25  Calcium 8.9 - 10.3 mg/dL 9.0 - 9.4    Micro Results: No results found for this or any previous visit (from the past 240 hour(s)). Studies/Results: Ct Head Wo Contrast  Result Date: 10/23/2017 CLINICAL DATA:  Confusion. Dizziness and headache starting 3 days ago. EXAM: CT HEAD WITHOUT CONTRAST TECHNIQUE: Contiguous axial images were obtained from the base of the skull through the vertex without intravenous contrast. COMPARISON:  02/18/2005 FINDINGS: Brain: There is a wedge-shaped area of hypoattenuation in the right parietal lobe, involving the corticomedullary junction. This is consistent with a recent infarct, likely subacute. No other evidence of a recent infarction. There is encephalomalacia along the medial right frontal lobe consistent with an old infarct. There is an old lacune infarct in the anterior left basal ganglia. A  small area of old infarction is noted within the left posterior occipital lobe. The ventricles are normal in size, for this patient's age, and normal in configuration. There are patchy areas of white matter hypoattenuation bilaterally consistent with moderate chronic microvascular ischemic change. There are no parenchymal masses. No significant mass effect or midline shift. There are no extra-axial masses. No intracranial hemorrhage.  Vascular: No hyperdense vessel or unexpected calcification. Skull: Normal. Negative for fracture or focal lesion. Sinuses/Orbits: Globes and orbits are unremarkable. Sinuses are clear. Other: None. IMPRESSION: 1. Subacute right parietal lobe infarct. 2. No other recent abnormality. 3. Old infarcts and moderate chronic microvascular ischemic change. 4. No intracranial hemorrhage. Electronically Signed   By: Amie Portland M.D.   On: 10/23/2017 17:18   Ct Angio Neck W Or Wo Contrast  Result Date: 10/24/2017 CLINICAL DATA:  Dizziness and headache beginning 4 days ago. Right parietal stroke. EXAM: CT ANGIOGRAPHY NECK TECHNIQUE: Multidetector CT imaging of the neck was performed using the standard protocol during bolus administration of intravenous contrast. Multiplanar CT image reconstructions and MIPs were obtained to evaluate the vascular anatomy. Carotid stenosis measurements (when applicable) are obtained utilizing NASCET criteria, using the distal internal carotid diameter as the denominator. CONTRAST:  50mL ISOVUE-370 IOPAMIDOL (ISOVUE-370) INJECTION 76% COMPARISON:  CT and MRI done yesterday. FINDINGS: Aortic arch: The arch shows tortuosity with mild atherosclerotic change but no focal aneurysm or dissection. Findings are consistent with the history of hypertension. Branching pattern of the brachiocephalic vessels is normal. No origin stenosis. Right carotid system: Common carotid artery widely patent to the bifurcation. The carotid bifurcation is widely patent. There is atherosclerotic plaque affecting the ICA bulb, with minimal diameter of 4.5 mm. Compared to a more distal cervical ICA diameter of 5 mm, this indicates a 10% stenosis. Left carotid system: Common carotid artery widely patent to the bifurcation. Carotid bifurcation is widely patent. Mild non stenotic plaque of the proximal external carotid artery. ICA bulb appears normal. No stenosis. Cervical ICA widely patent. Vertebral arteries: Both  vertebral artery origins are widely patent. The right vertebral artery is larger than the left. No vertebral stenosis. Both vessels are patent to the basilar. Skeleton: Ordinary mid cervical spondylosis. C2-3 fusion. C4-5 fusion. Advanced dental and periodontal disease. Other neck: No mass or lymphadenopathy. Upper chest: Normal IMPRESSION: Aortic atherosclerosis and tortuosity consistent with the history of hypertension. No aneurysm or dissection. Atherosclerotic plaque at the ICA bulb on the left, but with stenosis of only 10%. No stenosis at the left carotid bifurcation. No posterior circulation stenosis. Electronically Signed   By: Paulina Fusi M.D.   On: 10/24/2017 14:27   Mr Brain Wo Contrast  Result Date: 10/24/2017 CLINICAL DATA:  72 y/o M; headache and confusion. Evaluation for stroke. EXAM: MRI HEAD WITHOUT CONTRAST MRA HEAD WITHOUT CONTRAST TECHNIQUE: Multiplanar, multiecho pulse sequences of the brain and surrounding structures were obtained without intravenous contrast. Angiographic images of the head were obtained using MRA technique without contrast. COMPARISON:  10/23/2017 CT head. FINDINGS: MRI HEAD FINDINGS Brain: Right parietal lobe focus of reduced diffusion measuring 2.8 x 4.4 x 2.3 cm (volume = 15 cm^3)(AP x ML x CC series 5, image 66 and series 7, image 46) compatible with acute/early subacute infarction. The area of diffusion signal abnormality is stable in distribution in comparison with prior CT of head given differences in technique. No associated hemorrhage or significant mass effect. Right paramedian frontal lobe chronic infarction. Chronic lacunar infarcts are present within the left thalamus and  left anterior caudate head/putamen. Patchy confluent nonspecific foci of T2 FLAIR hyperintense signal abnormality in subcortical and periventricular white matter are compatible with moderate chronic microvascular ischemic changes for age. Moderate brain parenchymal volume loss. Numerous  punctate foci of susceptibility hypointensity are present scattered throughout the brain in both central and peripheral distribution compatible with hemosiderin deposition of chronic microhemorrhage. Vascular: As below. Skull and upper cervical spine: Normal marrow signal. Sinuses/Orbits: Negative. Other: None. MRA HEAD FINDINGS Internal carotid arteries: Patent. Irregularity of carotid siphons without significant stenosis compatible with atherosclerotic disease. Anterior cerebral arteries:  Patent. Middle cerebral arteries: Patent. Anterior communicating artery: Patent. Posterior communicating arteries:  Patent. Posterior cerebral arteries:  Patent.  Mild left P2 stenosis. Basilar artery:  Patent.  Mild mid basilar stenosis. Vertebral arteries: Patent. Right dominant. Mild stenosis in distal V4 segments. No evidence of high-grade stenosis, large vessel occlusion, or aneurysm identified. IMPRESSION: MRI head: 1. Right parietal acute/early subacute infarction measuring up to 4.4 cm, 15 cc. No associated hemorrhage or mass effect. 2. Moderate chronic microvascular ischemic changes and parenchymal volume loss of the brain. 3. Small chronic infarct in the right paramedian frontal lobe. Several small chronic lacunar infarcts in basal ganglia. 4. Numerous nonspecific foci of chronic microhemorrhage diffusely throughout the brain probably related to chronic hypertension, less likely amyloid vasculopathy. Additionally, sequelae of traumatic brain injury, granulomatous disease, or vasculitis/vasculopathy could have this appearance. MRA head: Patent anterior and posterior intracranial circulation. No large vessel occlusion, high-grade stenosis, or aneurysm. Atherosclerosis with multiple segments of mild stenosis. Electronically Signed   By: Mitzi Hansen M.D.   On: 10/24/2017 00:19   Mr Shirlee Latch WR Contrast  Result Date: 10/24/2017 CLINICAL DATA:  72 y/o M; headache and confusion. Evaluation for stroke. EXAM:  MRI HEAD WITHOUT CONTRAST MRA HEAD WITHOUT CONTRAST TECHNIQUE: Multiplanar, multiecho pulse sequences of the brain and surrounding structures were obtained without intravenous contrast. Angiographic images of the head were obtained using MRA technique without contrast. COMPARISON:  10/23/2017 CT head. FINDINGS: MRI HEAD FINDINGS Brain: Right parietal lobe focus of reduced diffusion measuring 2.8 x 4.4 x 2.3 cm (volume = 15 cm^3)(AP x ML x CC series 5, image 66 and series 7, image 46) compatible with acute/early subacute infarction. The area of diffusion signal abnormality is stable in distribution in comparison with prior CT of head given differences in technique. No associated hemorrhage or significant mass effect. Right paramedian frontal lobe chronic infarction. Chronic lacunar infarcts are present within the left thalamus and left anterior caudate head/putamen. Patchy confluent nonspecific foci of T2 FLAIR hyperintense signal abnormality in subcortical and periventricular white matter are compatible with moderate chronic microvascular ischemic changes for age. Moderate brain parenchymal volume loss. Numerous punctate foci of susceptibility hypointensity are present scattered throughout the brain in both central and peripheral distribution compatible with hemosiderin deposition of chronic microhemorrhage. Vascular: As below. Skull and upper cervical spine: Normal marrow signal. Sinuses/Orbits: Negative. Other: None. MRA HEAD FINDINGS Internal carotid arteries: Patent. Irregularity of carotid siphons without significant stenosis compatible with atherosclerotic disease. Anterior cerebral arteries:  Patent. Middle cerebral arteries: Patent. Anterior communicating artery: Patent. Posterior communicating arteries:  Patent. Posterior cerebral arteries:  Patent.  Mild left P2 stenosis. Basilar artery:  Patent.  Mild mid basilar stenosis. Vertebral arteries: Patent. Right dominant. Mild stenosis in distal V4 segments. No  evidence of high-grade stenosis, large vessel occlusion, or aneurysm identified. IMPRESSION: MRI head: 1. Right parietal acute/early subacute infarction measuring up to 4.4 cm, 15 cc. No associated hemorrhage  or mass effect. 2. Moderate chronic microvascular ischemic changes and parenchymal volume loss of the brain. 3. Small chronic infarct in the right paramedian frontal lobe. Several small chronic lacunar infarcts in basal ganglia. 4. Numerous nonspecific foci of chronic microhemorrhage diffusely throughout the brain probably related to chronic hypertension, less likely amyloid vasculopathy. Additionally, sequelae of traumatic brain injury, granulomatous disease, or vasculitis/vasculopathy could have this appearance. MRA head: Patent anterior and posterior intracranial circulation. No large vessel occlusion, high-grade stenosis, or aneurysm. Atherosclerosis with multiple segments of mild stenosis. Electronically Signed   By: Mitzi HansenLance  Furusawa-Stratton M.D.   On: 10/24/2017 00:19   Medications: I have reviewed the patient's current medications. Scheduled Meds: .  stroke: mapping our early stages of recovery book   Does not apply Once  . aspirin EC  81 mg Oral Daily  . clopidogrel  75 mg Oral Daily  . enoxaparin (LOVENOX) injection  40 mg Subcutaneous Q24H  . rosuvastatin  20 mg Oral q1800  . tamsulosin  0.4 mg Oral Q supper   Continuous Infusions: PRN Meds:.acetaminophen **OR** acetaminophen (TYLENOL) oral liquid 160 mg/5 mL **OR** acetaminophen, senna-docusate Assessment/Plan: Active Problems:   CVA (cerebral vascular accident) (HCC)   Hyperlipidemia   Essential hypertension   1. CVA -CT and MRI Head demonstrate subacute R parietal infarction w chronic microvascular ischemic changes -CT Angio Neck - Tortuosity of aortic arch and mild atherosclerotic changes, No focal aneurysm or dissection.  -On continuous telemetry to monitor for arrhythmia -2D Trans thoracic echocardiogram demonstrates mild  LVH (EF 55-60%) -TEE pending 10/27/17 per Neuro -PT/OT recommend outpatient follow up, pt demonstrates mild left sided hemianopsia -SLP does not suggest further Speech Language Pathology assistance -Dual anti platelet therapy: Aspirin 81 mg Qdaily // Plavix 75 mg PO QDaily  2. HTN -Losartan 100 mg held (permissive HTN) -BP stable   3. HLD -Rosuvastatin 20 mg PO Qdaily  4. VTE Prophylaxis -Lovenox injection 40 mg Qdaily  5. FEN/GI -Normal cardiac diet   Dispo: Anticipated discharge approximately 10/27/17 or 10/28/17 pending CVA work up  This is a Psychologist, occupationalMedical Student Note.  The care of the patient was discussed with Dr. Josem KaufmannKlima and the assessment and plan formulated with their assistance.  Please see their attached note for official documentation of the daily encounter.   LOS: 2 days   Margret ChanceGarba, Bryttani Blew L, Medical Student 10/25/2017, 9:51 AM

## 2017-10-25 NOTE — Progress Notes (Addendum)
Occupational Therapy Treatment Patient Details Name: Jordan [name] MRN: [id number] DOB: 1946/04/15 Today's Date: 10/25/2017    History of present illness Jordan [name] is a 72 y.o. male who presented to the ED with a 4 day history of headache, confusion, and feeling dizzy. MRI/CT head revealed a subacute right parietal lobe ischemic infarction. PMHx of HTN, HLD, and BPH.   OT comments  Pt progressing. Education provided in session. Feel pt will benefit from acute OT to increase independence prior to d/c. Continue to recommend Outpatient OT.  Follow Up Recommendations  Outpatient OT;Supervision - Intermittent    Equipment Recommendations  Tub/shower seat    Recommendations for Other Services      Precautions / Restrictions Precautions Precautions: Fall Restrictions Weight Bearing Restrictions: No       Mobility Bed Mobility Overal bed mobility: Needs Assistance Bed Mobility: Supine to Sit     Supine to sit: Supervision        Transfers Overall transfer level: Needs assistance   Transfers: Sit to/from Stand Sit to Stand: Min guard              Balance      Min guard-supervision for ambulation.                                   ADL either performed or assessed with clinical judgement   ADL Overall ADL's : Needs assistance/impaired     Grooming: Wash/dry face;Applying deodorant;Oral care;Set up;Supervision/safety;Standing   Upper Body Bathing: Standing;Set up;Supervision/ safety Upper Body Bathing Details (indicate cue type and reason): washed and dried armpits         Lower Body Dressing: Min guard;Sit to/from stand Lower Body Dressing Details (indicate cue type and reason): pt gathered pants out of closet and donned them. Toilet Transfer: Min guard;Ambulation(sit to stand from bed; ambulated in session Min guard-S)           Functional mobility during ADLs: Min guard(Min guard-supervision for ambulation) General ADL Comments: Had  pt try to remember three words and tell me later in session-did well with this. Pt forgot one thing OT asked him to do at sink.      Vision   Additional Comments: pt having difficulty with Rt visual field when tested (seemed to miss more on Rt side). Had pt also try to locate items in room and hallway in peripheral vision which pt did ok with.    Perception     Praxis      Cognition Arousal/Alertness: Awake/alert Behavior During Therapy: WFL for tasks assessed/performed Overall Cognitive Status: No family/caregiver present to determine baseline cognitive functioning Area of Impairment: Problem solving;Memory                     Memory: Decreased short-term memory       Problem Solving: Slow processing General Comments: pt attempted to go in bathroom to spit out toothpaste versus at sink. Took a little while to locate items.        Exercises Exercises: Other exercises Other Exercises Other Exercises: performed fine motor exercises with Lt hand.   Shoulder Instructions       General Comments      Pertinent Vitals/ Pain       Pain Assessment: No/denies pain  Home Living  Prior Functioning/Environment              Frequency  Min 2X/week        Progress Toward Goals  OT Goals(current goals can now be found in the care plan section)  Progress towards OT goals: Progressing toward goals  Acute Rehab OT Goals Patient Stated Goal: not stated OT Goal Formulation: With patient Time For Goal Achievement: 11/07/17 Potential to Achieve Goals: Good ADL Goals Pt Will Perform Grooming: with modified independence;standing Pt Will Perform Lower Body Dressing: with modified independence;sit to/from stand Pt Will Transfer to Toilet: with modified independence;ambulating Pt Will Perform Toileting - Clothing Manipulation and hygiene: with modified independence;sit to/from stand Additional ADL Goal #1: Pt  will recall 3 visual strategies for deficits at indpendent level Additional ADL Goal #2: Pt will perform fine motor coordination HEP for Lt hand at independent level  Plan Discharge plan remains appropriate    Co-evaluation                 AM-PAC PT "6 Clicks" Daily Activity     Outcome Measure   Help from another person eating meals?: None Help from another person taking care of personal grooming?: A Little Help from another person toileting, which includes using toliet, bedpan, or urinal?: A Little Help from another person bathing (including washing, rinsing, drying)?: A Little Help from another person to put on and taking off regular upper body clothing?: A Little Help from another person to put on and taking off regular lower body clothing?: A Little 6 Click Score: 19    End of Session    OT Visit Diagnosis: Other (comment)(decreased cognition and coordination)   Activity Tolerance Patient tolerated treatment well   Patient Left in chair;with call bell/phone within reach   Nurse Communication Other (comment);Mobility status(cognition off; not on chair alarm but nurse ok with this)        Time: 1008-1030 OT Time Calculation (min): 22 min  Charges: OT General Charges $OT Visit: 1 Visit OT Treatments $Self Care/Home Management : 8-22 mins     [name] OTR/L 10/25/2017, 10:57 AM

## 2017-10-25 NOTE — Progress Notes (Signed)
   Subjective:  Patient states he continues to feel improvement. He is no longer feeling "foggy" in his head. He denies new incoordination, weakness, vision changes. He understands and is in agreement with workup that has already been done and the plan for TEE/Loop insertion on Monday. We discussed what outpatient PT/OT entails and he is in agreement with proceeding.   Objective:  Vital signs in last 24 hours: Vitals:   10/24/17 2020 10/24/17 2344 10/25/17 0416 10/25/17 0719  BP: (!) 162/85 (!) 160/66 (!) 167/72 (!) 149/65  Pulse: 64 62 63 62  Resp: 16 16 16 16   Temp: 98.5 F (36.9 C) 98.3 F (36.8 C) 98 F (36.7 C) 98 F (36.7 C)  TempSrc: Oral Oral Oral Oral  SpO2: 99% 100% 99% 99%   Constitutional: NAD, pleasant CV: RRR, no m/r/g Resp: CTAB on anterior lung fields, no increased work of breathing Abd: soft, NDNT Neuro: Left hemianopsia, otherwise CN 2-12 intact, strength and sensation intact throughout, and no dysmetria present today  Assessment/Plan:  Active Problems:   CVA (cerebral vascular accident) (HCC)   Hyperlipidemia   Essential hypertension  Acute/Subacute R parietal CVA: Work up unrevealing so far; echo without thrombus or significant structural abnormality; CTA neck w/o significant carotid stenosis; MRA w/o significant large vessel stenosis; A1c 6.4; hyperlipidemia well controlled with home crestor. Tele personally reviewed showing SR.  --Outpt PT/OT --ASA+plavix x3 wks then one alone (would favor plavix as patient was on daily asa 81mg  prior to hospitalization).  --continue crestor 20mg  daily --TEE with loop recorder insertion on Monday  HTN: On losartan 100mg  daily at home. We have been allowing permissive hypertension. Suspect stroke occurred July 1st based on symptoms. Will restart losartan for tomorrow.  Dispo: Anticipated discharge in approximately 2-3 day(s).   Nyra MarketSvalina, Shelita Steptoe, MD 10/25/2017, 11:23 AM Pager [phone]

## 2017-10-26 ENCOUNTER — Emergency Department (HOSPITAL_COMMUNITY): Admit: 2017-10-26 | Discharge: 2017-10-26 | Discharge status: Absent without leave | Payer: Medicare HMO

## 2017-10-26 DIAGNOSIS — I6300 Cerebral infarction due to thrombosis of unspecified precerebral artery: Secondary | ICD-10-CM

## 2017-10-26 DIAGNOSIS — I639 Cerebral infarction, unspecified: Secondary | ICD-10-CM

## 2017-10-26 DIAGNOSIS — H5347 Heteronymous bilateral field defects: Secondary | ICD-10-CM
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
## 2017-10-26 NOTE — Progress Notes (Signed)
Paged at 1am about pt's past medical history of suicidal thoughts. RN states he had suicidal ideation in the past w/o plan. Currently denies SI,HI per RN. Attempted to see pt but he is asleep.

## 2017-10-26 NOTE — Progress Notes (Signed)
Rn filling patient admission documentation.  Pt. Admitted to having suicidal thoughts prior to his current stroke.  Will alert MD.
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
## 2017-10-26 NOTE — Progress Notes (Signed)
Internal Medicine Attending  Date: 10/26/2017  Patient name: Jordan [name] Medical record number: [id number] Date of birth: Oct 05, 1945 Age: 72 y.o. Gender: male  I saw and evaluated the patient. I reviewed the resident's note by Dr. Samuella CotaSvalina and I agree with the resident's findings and plans as documented in her progress note.  When seen on rounds this morning [name] was without complaints. Physical exam was notable for intact strength throughout. Finger to nose to finger was performed without any dysmetria bilaterally. He continues to have some mild right hemianopsia. The plan is for an implantable loop recorder as well as TEE tomorrow. He should be ready for discharge after these procedures are completed.
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
## 2017-10-26 NOTE — Plan of Care (Signed)
Patient stable, discussed POC with patient, agreeable with plan, denies question/concerns at this time.  

## 2017-10-27 ENCOUNTER — Inpatient Hospital Stay (HOSPITAL_COMMUNITY): Payer: Medicare HMO

## 2017-10-27 ENCOUNTER — Encounter (HOSPITAL_COMMUNITY)
Admission: EM | Disposition: A | Discharge status: Home / Self Care | Payer: Self-pay | Source: Home / Self Care | Attending: Internal Medicine

## 2017-10-27 ENCOUNTER — Encounter (HOSPITAL_COMMUNITY): Payer: Self-pay | Admitting: *Deleted

## 2017-10-27 DIAGNOSIS — F339 Major depressive disorder, recurrent, unspecified: Secondary | ICD-10-CM

## 2017-10-27 DIAGNOSIS — I340 Nonrheumatic mitral (valve) insufficiency: Secondary | ICD-10-CM

## 2017-10-27 DIAGNOSIS — I639 Cerebral infarction, unspecified: Secondary | ICD-10-CM

## 2017-10-27 DIAGNOSIS — I6389 Other cerebral infarction: Principal | ICD-10-CM

## 2017-10-27 HISTORY — PX: TEE WITHOUT CARDIOVERSION: SHX5443

## 2017-10-27 HISTORY — PX: LOOP RECORDER INSERTION: EP1214

## 2017-10-27 SURGERY — ECHOCARDIOGRAM, TRANSESOPHAGEAL
Anesthesia: Moderate Sedation

## 2017-10-27 SURGERY — LOOP RECORDER INSERTION

## 2017-10-27 MED ORDER — HEPARIN SODIUM (PORCINE) 5000 UNIT/ML IJ SOLN
5000.0000 [IU] | Freq: Three times a day (TID) | INTRAMUSCULAR | Status: DC
  Administered 2017-10-27 – 2017-10-28 (×2): 5000 [IU] via SUBCUTANEOUS
  Filled 2017-10-27 (×2): qty 1

## 2017-10-27 MED ORDER — SODIUM CHLORIDE 0.9 % IV SOLN: INTRAVENOUS | Status: DC

## 2017-10-27 MED ORDER — FENTANYL CITRATE (PF) 100 MCG/2ML IJ SOLN
INTRAMUSCULAR | Status: AC
  Filled 2017-10-27: qty 2

## 2017-10-27 MED ORDER — MIDAZOLAM HCL 5 MG/5ML IJ SOLN
INTRAMUSCULAR | Status: DC | PRN
  Administered 2017-10-27 (×2): 2 mg via INTRAVENOUS

## 2017-10-27 MED ORDER — BUPIVACAINE HCL (PF) 0.25 % IJ SOLN
INTRAMUSCULAR | Status: DC | PRN
  Administered 2017-10-27: 20 mL

## 2017-10-27 MED ORDER — ONDANSETRON HCL 4 MG/2ML IJ SOLN: 4.0000 mg | Freq: Four times a day (QID) | INTRAMUSCULAR | Status: DC | PRN

## 2017-10-27 MED ORDER — LIDOCAINE VISCOUS HCL 2 % MT SOLN
OROMUCOSAL | Status: AC
  Filled 2017-10-27: qty 15

## 2017-10-27 MED ORDER — MIDAZOLAM HCL 5 MG/ML IJ SOLN
INTRAMUSCULAR | Status: AC
  Filled 2017-10-27: qty 2

## 2017-10-27 MED ORDER — LIDOCAINE VISCOUS HCL 2 % MT SOLN
OROMUCOSAL | Status: DC | PRN
  Administered 2017-10-27: 20 mL via OROMUCOSAL

## 2017-10-27 MED ORDER — ACETAMINOPHEN 325 MG PO TABS: 325.0000 mg | ORAL_TABLET | ORAL | Status: DC | PRN

## 2017-10-27 MED ORDER — FENTANYL CITRATE (PF) 100 MCG/2ML IJ SOLN
INTRAMUSCULAR | Status: DC | PRN
  Administered 2017-10-27 (×2): 25 ug via INTRAVENOUS

## 2017-10-27 MED ORDER — LIDOCAINE-EPINEPHRINE 1 %-1:100000 IJ SOLN
INTRAMUSCULAR | Status: AC
  Filled 2017-10-27: qty 1

## 2017-10-27 SURGICAL SUPPLY — 2 items
LOOP REVEAL LINQSYS (Prosthesis & Implant Heart) ×2 IMPLANT
PACK LOOP INSERTION (CUSTOM PROCEDURE TRAY) ×3 IMPLANT

## 2017-10-27 NOTE — Discharge Summary (Signed)
Name: Jordan [name] MRN: [id number] DOB: [birth year] 72 y.o. PCP: Sherrie Mustache, MD  Date of Admission: 10/23/2017  4:18 PM Date of Discharge: 10/28/2017 Attending Physician: Burns Spain, MD Discharge Diagnosis: 1. Acute/Subacute R parietal CVA 2. Essential HTN 3. Hyperlipidemia 4. Depression   Discharge Medications: Allergies as of 10/28/2017   No Known Allergies     Medication List    TAKE these medications   aspirin EC 81 MG tablet Take 1 tablet (81 mg total) by mouth daily. Stop after 3 weeks and just take Plavix. What changed:  additional instructions   clopidogrel 75 MG tablet Commonly known as:  PLAVIX Take 1 tablet (75 mg total) by mouth daily.   losartan 100 MG tablet Commonly known as:  COZAAR Take 100 mg by mouth daily.   rosuvastatin 20 MG tablet Commonly known as:  CRESTOR Take 20 mg by mouth daily.   sertraline 50 MG tablet Commonly known as:  ZOLOFT Take 1 tablet (50 mg total) by mouth daily.   tamsulosin 0.4 MG Caps capsule Commonly known as:  FLOMAX Take 0.4 mg by mouth daily with supper.       Disposition and follow-up:   JordanJordan [name] was discharged from [facility] in Stable condition.  At the hospital follow up visit please address:  1. Jordan [name] was admitted for acute/subacute R parietal cryptogenic stroke. Work-up for embolic versus ischemic source in the hospital was unrevealing. A loop recorder was placed on 7/8 to evaluate for possible afib. He was discharged home on dual antiplatelet therapy with ASA and plavix x3 weeks (completion 7/26). He was advised not to drive until outpatient PCP and neurology follow-up. He also admitted to struggling with depression for over 10 years with intermittent passive SI that he wished to have addressed professionally. We started him on Zoloft 50 mg to start working towards therapeutic benefit; PCP can adjust as they see fit.   2.  Labs / imaging needed at time of follow-up:  none  3.  Pending labs/ test needing follow-up: none Follow-up Appointments: Follow-up Information    [facility] Follow up.   Specialty:  Rehabilitation Why:  They will contact you for the first appointment. Contact information: [address] [phone] mc [place] [address] [phone]       [facility] Follow up in 4 week(s).   Specialty:  Neurology Why:  stroke clinic. office will call with appt date and time. Contact information: [address] [place] [address] [phone]          Hospital Course by problem list: Jordan [name] is a 72 y.o. Male with PMHx significant for Hyperlipidemia, Essential Hypertension and BPH who presents with new onset ischemic right parietal CVA. Hospital course is by problem list below:   Hospital Course by problem list:  1. Acute/Subacute R Parietal CVA  Jordan [name] was admitted on 7/4 with a 4 day history of headache, blurry vision, and confusion described as "foggy headed" and difficulty performing daily tasks such as driving and dressing himself. Head CT confirmed subacute R parietal infarct. Neuro was consulted. Initial work-up was unrevealing. Cardiac echo showed no evidence of thrombus or significant structural abnormality. There was no evidence of arrhythmia on telemetry. CTA neck revealed no significant carotid stenosis. MRA without significant large vessel stenosis. A1C was 6.4; hyperlipidemia was well controlled on home crestor. After viewing the MRI which showed evidence of old infarcts, Neuro decided  on further work-up for possible embolic source. TEE showed no evidence of thrombus or PFO. LE dopplers were negative. It was then decided to have cardiology place loop recorder to monitor for paroxysmal afib.  PT/OT worked with him throughout his admission and determined that he was safe for discharge home with outpatient PT.  Mr.  Shiela [name]'s symptoms improved throughout his admission, and he was no longer complaining of headache, confusion or vision changes at time of discharge. He was discharged home on ASA+plavix x3 weeks. He will likely benefit from Plavix alone since he was already on ASA 81 mg prior to hospitalization.     2. Essential Hypertension  His home dose of Losartan was held initially to allow permissive HTN in the setting of acute CVA. It was restarted on 10/26/17.   3. Hyperlipidemia  Lipid panel on admission showed LDL of 75, HDL 32. He was continued on home dose of Crestor 20 mg.    4. Depression  During his admission, Jordan [name] admitted to struggling with depression for over 10 years. He notes some intermittent passive SI in the past, but has not ever made a plan or attempts for religious reasons. He has talked about his depression with his nephews who are pastors, but would like some professional help with this outpatient. He denied any current SI/HI.  He was agreeable to starting an SSRI after discussing the benefits and possible side effects. We started him on Zoloft 50 mg and told him to follow up with his PCP who can adjust as needed.   Discharge Vitals:   BP (!) 147/67 (BP Location: Left Arm)   Pulse 73   Temp 98.3 F (36.8 C) (Oral)   Resp 16   Ht 5\' 11"  (1.803 m)   Wt 174 lb 6.1 oz (79.1 kg)   SpO2 100%   BMI 24.32 kg/m   Pertinent Labs, Studies, and Procedures:  CT head: IMPRESSION: 1. Subacute right parietal lobe infarct. 2. No other recent abnormality. 3. Old infarcts and moderate chronic microvascular ischemic change. 4. No intracranial hemorrhage.  CTA neck: IMPRESSION: Aortic atherosclerosis and tortuosity consistent with the history of hypertension. No aneurysm or dissection. Atherosclerotic plaque at the ICA bulb on the left, but with stenosis of only 10%. No stenosis at the left carotid bifurcation. No posterior circulation stenosis.  MRI head:  1. Right parietal  acute/early subacute infarction measuring up to 4.4 cm, 15 cc. No associated hemorrhage or mass effect. 2. Moderate chronic microvascular ischemic changes and parenchymal volume loss of the brain. 3. Small chronic infarct in the right paramedian frontal lobe. Several small chronic lacunar infarcts in basal ganglia. 4. Numerous nonspecific foci of chronic microhemorrhage diffusely throughout the brain probably related to chronic hypertension, less likely amyloid vasculopathy. Additionally, sequelae of traumatic brain injury, granulomatous disease, or vasculitis/vasculopathy could have this appearance.  MRA head:  Patent anterior and posterior intracranial circulation. No large vessel occlusion, high-grade stenosis, or aneurysm. Atherosclerosis with multiple segments of mild stenosis.   Discharge Instructions: Discharge Instructions    Ambulatory referral to Behavioral Health   Complete by:  As directed    Ambulatory referral to Neurology   Complete by:  As directed    Follow up with stroke clinic NP (Jessica Vanschaick or Darrol Angelarolyn Martin, if both not available, consider Dr. Delia HeadyPramod Sethi, Dr. Jamelle RushingVikram Penumali, or Dr. Naomie DeanAntonia Ahern) [facility] in about 4 weeks.   Ambulatory referral to Occupational Therapy   Complete by:  As directed  S/p stroke   Ambulatory referral to Occupational Therapy   Complete by:  As directed    Ambulatory referral to Physical Therapy   Complete by:  As directed    S/p stroke   Ambulatory referral to Physical Therapy   Complete by:  As directed    Call MD for:  difficulty breathing, headache or visual disturbances   Complete by:  As directed    Call MD for:  difficulty breathing, headache or visual disturbances   Complete by:  As directed    Call MD for:  extreme fatigue   Complete by:  As directed    Call MD for:  extreme fatigue   Complete by:  As directed    Call MD for:  hives   Complete by:  As directed    Call MD for:   hives   Complete by:  As directed    Call MD for:  persistant dizziness or light-headedness   Complete by:  As directed    Call MD for:  persistant dizziness or light-headedness   Complete by:  As directed    Call MD for:  persistant nausea and vomiting   Complete by:  As directed    Call MD for:  persistant nausea and vomiting   Complete by:  As directed    Call MD for:  redness, tenderness, or signs of infection (pain, swelling, redness, odor or green/yellow discharge around incision site)   Complete by:  As directed    Call MD for:  redness, tenderness, or signs of infection (pain, swelling, redness, odor or green/yellow discharge around incision site)   Complete by:  As directed    Call MD for:  severe uncontrolled pain   Complete by:  As directed    Call MD for:  severe uncontrolled pain   Complete by:  As directed    Call MD for:  temperature >100.4   Complete by:  As directed    Call MD for:  temperature >100.4   Complete by:  As directed    Diet - low sodium heart healthy   Complete by:  As directed    Diet - low sodium heart healthy   Complete by:  As directed    Discharge instructions   Complete by:  As directed    You were in the hospital for a stroke. The neurologist recommend that you take a baby aspirin (81mg ) and plavix 75mg  daily for 3 week then just the plavix alone from that point on.   You had a loop recorder placed to monitor your heart rhythm long term in case you are having irregular heart rhythms that could be associated with causing a stroke.   Referrals have been placed for physical and occupational therapy; you should be hearing about an appointment within 1 week.  Please follow up with neurology and with your primary care doctor.   Discharge instructions   Complete by:  As directed    Please take the prescribed Plavix and aspirin for 3 weeks; after 3 weeks, start taking Plavix only. We have started you on Zoloft as we discussed to start treating you  for depression. Your primary care doctor can follow-up and adjust if necessary.  Please remember no driving until you are cleared by neurology or your primary care doctor.   Driving Restrictions   Complete by:  As directed    Please do not drive until you are cleared by your PCP and/or neurology.   Increase activity  slowly   Complete by:  As directed    Increase activity slowly   Complete by:  As directed       Signed: Bridget Hartshorn, DO 10/30/2017, 2:29 PM   Pager: [phone]

## 2017-10-27 NOTE — Care Management Note (Signed)
Case Management Note  Patient Details  Name: OSCEOLA DEPAZ MRN: [id number] Date of Birth: 05-20-1945  Subjective/Objective:                    Action/Plan: Pt will d/c home with outpatient therapy. CM met with patient and he would like to attend [facility]. Orders in Epic and information on the AVS. Pt needing seat for shower. Pt interested in 3 in 1. James with [facility] DME notified and will deliver to the room.  Pt states his sister will provide transportation home and can provide intermittent supervision at home.   Expected Discharge Date:                  Expected Discharge Plan:  OP Rehab  In-House Referral:     Discharge planning Services  CM Consult  Post Acute Care Choice:  Durable Medical Equipment Choice offered to:  Patient  DME Arranged:  3-N-1 DME Agency:  [place]:    [place] Agency:     Status of Service:  Completed, signed off  If discussed at [place] of Stay Meetings, dates discussed:    Additional Comments:  Pollie Friar, RN 10/27/2017, 10:30 AM

## 2017-10-27 NOTE — Progress Notes (Signed)
  Date: 10/27/2017  Patient name: Jordan [name]  Medical record number: [id number]  Date of birth: 1945-06-20   I have seen and evaluated this patient and I have discussed the plan of care with the house staff. Please see their note for complete details. I concur with their findings with the following additions/corrections: Mr [name] was seen on AM rounds with team. His depression started at retirement, about 10 yrs ago. It is not affecting his current job. He has family support and has been discussing with two nephews who are preachers. He desires professional help at D/C. Will defer SSRI to his PCP since sxs ongoing for 10 yrs and he wants counseling. For CVA, TEE negative, loop recorder being implanted. To cont ASA, plavix, statin. We advised no driving until seen by PCP and neuro. He had some mental status changes on admit - he states the fogginess has cleared. Had 2/3 recall oat 5 minutes. Not as neat and tidy as usual (per notes) so will need family collaberation that back at baseline. Stable for D/C home  Burns SpainButcher, Elizabeth A, MD 10/27/2017, 1:36 PM

## 2017-10-27 NOTE — Consult Note (Addendum)
ELECTROPHYSIOLOGY CONSULT NOTE  Patient ID: DREVON PLOG MRN: [id number], DOB/AGE: 72-Dec-1947   Admit date: 10/23/2017 Date of Consult: 10/27/2017  Primary Physician: Sherrie Mustache, MD Primary Cardiologist: new to [facility] Reason for Consultation: Cryptogenic stroke; recommendations regarding Implantable Loop Recorder  History of Present Illness EP has been asked to evaluate Edmonia Lynch for placement of an implantable loop recorder to monitor for atrial fibrillation by Dr Pearlean Brownie.  The patient was admitted on 10/23/2017 with confusion, dizziness, and headache. Imaging demonstrated right parietal infarct in the setting of old embolic infarcts.  He has undergone workup for stroke including echocardiogram and carotid dopplers.  The patient has been monitored on telemetry which has demonstrated sinus rhythm with no arrhythmias.  Inpatient stroke work-up is to be completed with a TEE.   Echocardiogram this admission demonstrated EF 55-60%, mild LVH, no RWMA, LA 27.  Lab work is reviewed.  Prior to admission, the patient denies chest pain, shortness of breath, dizziness, palpitations, or syncope.  They are recovering from their stroke with plans to return home at discharge.   Past Medical History:  Diagnosis Date  . CVA (cerebral vascular accident) (HCC) 10/23/2017   CT confirms subacute R parietal infarct  . GERD (gastroesophageal reflux disease)   . High cholesterol   . Hypertension   . Sleep apnea    "wouldn't let them test me" (10/23/2017)     Surgical History:  Past Surgical History:  Procedure Laterality Date  . NO PAST SURGERIES       Medications Prior to Admission  Medication Sig Dispense Refill Last Dose  . aspirin EC 81 MG tablet Take 81 mg by mouth daily.   Past Week at Unknown time  . losartan (COZAAR) 100 MG tablet Take 100 mg by mouth daily.   Past Week at Unknown time  . rosuvastatin (CRESTOR) 20 MG tablet Take 20 mg by mouth daily.   Past Week at Unknown time  .  tamsulosin (FLOMAX) 0.4 MG CAPS capsule Take 0.4 mg by mouth daily with supper.   Past Week at Unknown time    Inpatient Medications:  .  stroke: mapping our early stages of recovery book   Does not apply Once  . aspirin EC  81 mg Oral Daily  . clopidogrel  75 mg Oral Daily  . enoxaparin (LOVENOX) injection  40 mg Subcutaneous Q24H  . losartan  100 mg Oral Daily  . rosuvastatin  20 mg Oral q1800  . tamsulosin  0.4 mg Oral Q supper    Allergies: No Known Allergies  Social History   Socioeconomic History  . Marital status: Divorced    Spouse name: Not on file  . Number of children: Not on file  . Years of education: Not on file  . Highest education level: Not on file  Occupational History  . Not on file  Social Needs  . Financial resource strain: Not on file  . Food insecurity:    Worry: Not on file    Inability: Not on file  . Transportation needs:    Medical: Not on file    Non-medical: Not on file  Tobacco Use  . Smoking status: Former Smoker    Packs/day: 0.12    Years: 10.00    Pack years: 1.20    Types: Cigarettes, Pipe    Last attempt to quit: 1990    Years since quitting: 29.5  . Smokeless tobacco: Never Used  Substance and Sexual Activity  . Alcohol use:  Never    Frequency: Never  . Drug use: Never  . Sexual activity: Not on file  Lifestyle  . Physical activity:    Days per week: Not on file    Minutes per session: Not on file  . Stress: Not on file  Relationships  . Social connections:    Talks on phone: Not on file    Gets together: Not on file    Attends religious service: Not on file    Active member of club or organization: Not on file    Attends meetings of clubs or organizations: Not on file    Relationship status: Not on file  . Intimate partner violence:    Fear of current or ex partner: Not on file    Emotionally abused: Not on file    Physically abused: Not on file    Forced sexual activity: Not on file  Other Topics Concern  . Not  on file  Social History Narrative  . Not on file     Family History  Problem Relation Age of Onset  . CVA Mother 84  . Heart attack Father        71  . CVA Father       Review of Systems: All other systems reviewed and are otherwise negative except as noted above.  Physical Exam: Vitals:   10/26/17 1231 10/26/17 1547 10/26/17 2347 10/27/17 0525  BP: 133/62 (!) 153/83 (!) 144/79 (!) 153/73  Pulse: 75 77 76 63  Resp: 20 16 16 16   Temp: 97.8 F (36.6 C) 98 F (36.7 C) 98.3 F (36.8 C) 98 F (36.7 C)  TempSrc: Oral Oral Oral Oral  SpO2: 98% 99% 98% 100%  Weight:      Height:        GEN- The patient is well appearing, alert and oriented x 3 today.   Head- normocephalic, atraumatic Eyes-  Sclera clear, conjunctiva pink Ears- hearing intact Oropharynx- clear Neck- supple Lungs- Clear to ausculation bilaterally, normal work of breathing Heart- Regular rate and rhythm, no murmurs, rubs or gallops  GI- soft, NT, ND, + BS Extremities- no clubbing, cyanosis, or edema MS- no significant deformity or atrophy Skin- no rash or lesion Psych- euthymic mood, full affect   Labs:   Lab Results  Component Value Date   WBC 8.1 10/23/2017   HGB 16.0 10/23/2017   HCT 47.0 10/23/2017   MCV 90.7 10/23/2017   PLT  10/23/2017    PLATELET CLUMPS NOTED ON SMEAR, COUNT APPEARS ADEQUATE    Recent Labs  Lab 10/23/17 1641  10/24/17 0357  NA 137   < > 136  K 4.2   < > 4.2  CL 100   < > 101  CO2 25  --  29  BUN 9   < > 9  CREATININE 1.26*   < > 1.18  CALCIUM 9.4  --  9.0  PROT 7.2  --   --   BILITOT 0.7  --   --   ALKPHOS 58  --   --   ALT 16  --   --   AST 19  --   --   GLUCOSE 127*   < > 127*   < > = values in this interval not displayed.     Radiology/Studies: Ct Head Wo Contrast  Result Date: 10/23/2017 CLINICAL DATA:  Confusion. Dizziness and headache starting 3 days ago. EXAM: CT HEAD WITHOUT CONTRAST TECHNIQUE: Contiguous axial images were obtained from the  base of  the skull through the vertex without intravenous contrast. COMPARISON:  02/18/2005 FINDINGS: Brain: There is a wedge-shaped area of hypoattenuation in the right parietal lobe, involving the corticomedullary junction. This is consistent with a recent infarct, likely subacute. No other evidence of a recent infarction. There is encephalomalacia along the medial right frontal lobe consistent with an old infarct. There is an old lacune infarct in the anterior left basal ganglia. A small area of old infarction is noted within the left posterior occipital lobe. The ventricles are normal in size, for this patient's age, and normal in configuration. There are patchy areas of white matter hypoattenuation bilaterally consistent with moderate chronic microvascular ischemic change. There are no parenchymal masses. No significant mass effect or midline shift. There are no extra-axial masses. No intracranial hemorrhage. Vascular: No hyperdense vessel or unexpected calcification. Skull: Normal. Negative for fracture or focal lesion. Sinuses/Orbits: Globes and orbits are unremarkable. Sinuses are clear. Other: None. IMPRESSION: 1. Subacute right parietal lobe infarct. 2. No other recent abnormality. 3. Old infarcts and moderate chronic microvascular ischemic change. 4. No intracranial hemorrhage. Electronically Signed   By: Amie Portland M.D.   On: 10/23/2017 17:18   Ct Angio Neck W Or Wo Contrast  Result Date: 10/24/2017 CLINICAL DATA:  Dizziness and headache beginning 4 days ago. Right parietal stroke. EXAM: CT ANGIOGRAPHY NECK TECHNIQUE: Multidetector CT imaging of the neck was performed using the standard protocol during bolus administration of intravenous contrast. Multiplanar CT image reconstructions and MIPs were obtained to evaluate the vascular anatomy. Carotid stenosis measurements (when applicable) are obtained utilizing NASCET criteria, using the distal internal carotid diameter as the denominator. CONTRAST:  50mL  ISOVUE-370 IOPAMIDOL (ISOVUE-370) INJECTION 76% COMPARISON:  CT and MRI done yesterday. FINDINGS: Aortic arch: The arch shows tortuosity with mild atherosclerotic change but no focal aneurysm or dissection. Findings are consistent with the history of hypertension. Branching pattern of the brachiocephalic vessels is normal. No origin stenosis. Right carotid system: Common carotid artery widely patent to the bifurcation. The carotid bifurcation is widely patent. There is atherosclerotic plaque affecting the ICA bulb, with minimal diameter of 4.5 mm. Compared to a more distal cervical ICA diameter of 5 mm, this indicates a 10% stenosis. Left carotid system: Common carotid artery widely patent to the bifurcation. Carotid bifurcation is widely patent. Mild non stenotic plaque of the proximal external carotid artery. ICA bulb appears normal. No stenosis. Cervical ICA widely patent. Vertebral arteries: Both vertebral artery origins are widely patent. The right vertebral artery is larger than the left. No vertebral stenosis. Both vessels are patent to the basilar. Skeleton: Ordinary mid cervical spondylosis. C2-3 fusion. C4-5 fusion. Advanced dental and periodontal disease. Other neck: No mass or lymphadenopathy. Upper chest: Normal IMPRESSION: Aortic atherosclerosis and tortuosity consistent with the history of hypertension. No aneurysm or dissection. Atherosclerotic plaque at the ICA bulb on the left, but with stenosis of only 10%. No stenosis at the left carotid bifurcation. No posterior circulation stenosis. Electronically Signed   By: Paulina Fusi M.D.   On: 10/24/2017 14:27   Mr Brain Wo Contrast  Result Date: 10/24/2017 CLINICAL DATA:  72 y/o M; headache and confusion. Evaluation for stroke. EXAM: MRI HEAD WITHOUT CONTRAST MRA HEAD WITHOUT CONTRAST TECHNIQUE: Multiplanar, multiecho pulse sequences of the brain and surrounding structures were obtained without intravenous contrast. Angiographic images of the head  were obtained using MRA technique without contrast. COMPARISON:  10/23/2017 CT head. FINDINGS: MRI HEAD FINDINGS Brain: Right parietal lobe focus of  reduced diffusion measuring 2.8 x 4.4 x 2.3 cm (volume = 15 cm^3)(AP x ML x CC series 5, image 66 and series 7, image 46) compatible with acute/early subacute infarction. The area of diffusion signal abnormality is stable in distribution in comparison with prior CT of head given differences in technique. No associated hemorrhage or significant mass effect. Right paramedian frontal lobe chronic infarction. Chronic lacunar infarcts are present within the left thalamus and left anterior caudate head/putamen. Patchy confluent nonspecific foci of T2 FLAIR hyperintense signal abnormality in subcortical and periventricular white matter are compatible with moderate chronic microvascular ischemic changes for age. Moderate brain parenchymal volume loss. Numerous punctate foci of susceptibility hypointensity are present scattered throughout the brain in both central and peripheral distribution compatible with hemosiderin deposition of chronic microhemorrhage. Vascular: As below. Skull and upper cervical spine: Normal marrow signal. Sinuses/Orbits: Negative. Other: None. MRA HEAD FINDINGS Internal carotid arteries: Patent. Irregularity of carotid siphons without significant stenosis compatible with atherosclerotic disease. Anterior cerebral arteries:  Patent. Middle cerebral arteries: Patent. Anterior communicating artery: Patent. Posterior communicating arteries:  Patent. Posterior cerebral arteries:  Patent.  Mild left P2 stenosis. Basilar artery:  Patent.  Mild mid basilar stenosis. Vertebral arteries: Patent. Right dominant. Mild stenosis in distal V4 segments. No evidence of high-grade stenosis, large vessel occlusion, or aneurysm identified. IMPRESSION: MRI head: 1. Right parietal acute/early subacute infarction measuring up to 4.4 cm, 15 cc. No associated hemorrhage or mass  effect. 2. Moderate chronic microvascular ischemic changes and parenchymal volume loss of the brain. 3. Small chronic infarct in the right paramedian frontal lobe. Several small chronic lacunar infarcts in basal ganglia. 4. Numerous nonspecific foci of chronic microhemorrhage diffusely throughout the brain probably related to chronic hypertension, less likely amyloid vasculopathy. Additionally, sequelae of traumatic brain injury, granulomatous disease, or vasculitis/vasculopathy could have this appearance. MRA head: Patent anterior and posterior intracranial circulation. No large vessel occlusion, high-grade stenosis, or aneurysm. Atherosclerosis with multiple segments of mild stenosis. Electronically Signed   By: Mitzi Hansen M.D.   On: 10/24/2017 00:19   Mr Shirlee Latch KV Contrast  Result Date: 10/24/2017 CLINICAL DATA:  72 y/o M; headache and confusion. Evaluation for stroke. EXAM: MRI HEAD WITHOUT CONTRAST MRA HEAD WITHOUT CONTRAST TECHNIQUE: Multiplanar, multiecho pulse sequences of the brain and surrounding structures were obtained without intravenous contrast. Angiographic images of the head were obtained using MRA technique without contrast. COMPARISON:  10/23/2017 CT head. FINDINGS: MRI HEAD FINDINGS Brain: Right parietal lobe focus of reduced diffusion measuring 2.8 x 4.4 x 2.3 cm (volume = 15 cm^3)(AP x ML x CC series 5, image 66 and series 7, image 46) compatible with acute/early subacute infarction. The area of diffusion signal abnormality is stable in distribution in comparison with prior CT of head given differences in technique. No associated hemorrhage or significant mass effect. Right paramedian frontal lobe chronic infarction. Chronic lacunar infarcts are present within the left thalamus and left anterior caudate head/putamen. Patchy confluent nonspecific foci of T2 FLAIR hyperintense signal abnormality in subcortical and periventricular white matter are compatible with moderate  chronic microvascular ischemic changes for age. Moderate brain parenchymal volume loss. Numerous punctate foci of susceptibility hypointensity are present scattered throughout the brain in both central and peripheral distribution compatible with hemosiderin deposition of chronic microhemorrhage. Vascular: As below. Skull and upper cervical spine: Normal marrow signal. Sinuses/Orbits: Negative. Other: None. MRA HEAD FINDINGS Internal carotid arteries: Patent. Irregularity of carotid siphons without significant stenosis compatible with atherosclerotic disease. Anterior cerebral arteries:  Patent. Middle cerebral arteries: Patent. Anterior communicating artery: Patent. Posterior communicating arteries:  Patent. Posterior cerebral arteries:  Patent.  Mild left P2 stenosis. Basilar artery:  Patent.  Mild mid basilar stenosis. Vertebral arteries: Patent. Right dominant. Mild stenosis in distal V4 segments. No evidence of high-grade stenosis, large vessel occlusion, or aneurysm identified. IMPRESSION: MRI head: 1. Right parietal acute/early subacute infarction measuring up to 4.4 cm, 15 cc. No associated hemorrhage or mass effect. 2. Moderate chronic microvascular ischemic changes and parenchymal volume loss of the brain. 3. Small chronic infarct in the right paramedian frontal lobe. Several small chronic lacunar infarcts in basal ganglia. 4. Numerous nonspecific foci of chronic microhemorrhage diffusely throughout the brain probably related to chronic hypertension, less likely amyloid vasculopathy. Additionally, sequelae of traumatic brain injury, granulomatous disease, or vasculitis/vasculopathy could have this appearance. MRA head: Patent anterior and posterior intracranial circulation. No large vessel occlusion, high-grade stenosis, or aneurysm. Atherosclerosis with multiple segments of mild stenosis. Electronically Signed   By: Mitzi HansenLance  Furusawa-Stratton M.D.   On: 10/24/2017 00:19    12-lead ECG SR, rate 75  (personally reviewed) All prior EKG's in EPIC reviewed with no documented atrial fibrillation  Telemetry SR (personally reviewed)  Assessment and Plan:  1. Cryptogenic stroke The patient presents with cryptogenic stroke.  The patient has a TEE planned for this AM.  I spoke at length with the patient about monitoring for afib with an implantable loop recorder.  Risks, benefits, and alteratives to implantable loop recorder were discussed with the patient today.   At this time, the patient is very clear in their decision to proceed with implantable loop recorder.   Wound care was reviewed with the patient (keep incision clean and dry for 3 days).  Wound check scheduled and entered in AVS.  Please call with questions.   Gypsy BalsamAmber Seiler, NP 10/27/2017 7:41 AM   EP Attending  Patient seen and examined. Agree with the findings as noted above. The patient presents with a cryptogenic stroke. TEE does not explain the etiology of his stroke. I would suggest proceeding with insertion of an ILR. I have reviewed the indications/risks/benefits/and he wishes to proceed.  Leonia ReevesGregg Toy Samarin,M.D.

## 2017-10-27 NOTE — Care Management Important Message (Signed)
Important Message  Patient Details  Name: Jordan [name] MRN: [id number] Date of Birth: 1945/05/28   Medicare Important Message Given:  Yes    [name] 10/27/2017, 3:19 PM

## 2017-10-27 NOTE — Progress Notes (Signed)
   Subjective: Jordan [name] was seen and evaluated at beside this morning. He states his confusion and foggy-headed feeling are improved. Also notes his headache is resolved. He denies any chest pain, palpitations, lightheadedness or weakness. He has been able to get up and go to the bathroom on his own without any difficulty. There was a note over the weekend about possible SI. When we discussed this with him today, he admitted to struggling with depression for over 10 years and endorses occasional SI. This is something he has discussed with his nephews who are pastors, but would like professional help with this upon discharge. No active SI at this time.  Objective:  Vital signs in last 24 hours: Vitals:   10/27/17 1155 10/27/17 1210 10/27/17 1215 10/27/17 1220  BP: (!) 199/80 (!) 185/86 (!) 164/89 (!) 183/74  Pulse: 64 69 83 62  Resp: 19 20 18 13   Temp:      TempSrc:      SpO2: 97% 100% 100% 100%  Weight:      Height:       Physical Exam General: A&Ox3, lying in bed in NAD.  Cardiovascular: Normal rate, regular rhythm. No murmurs, gallops or rubs.  Pulmonary: No increased work of breathing. Lungs clear to auscultation throughout.  Abdominal: Bowel sounds +. Soft, non-distended, non-tender.  Neuro: A&Ox3. Able to recall 2/3 objects for short-term memory testing.    Assessment/Plan:  Active Problems:   CVA (cerebral vascular accident) (HCC)   Hyperlipidemia   Essential hypertension   Ischemic stroke (HCC)  1. Acute/Subacute R parietal CVA: Work up unrevealing so far; echo without thrombus or significant structural abnormality; CTA neck w/o significant carotid stenosis; MRA w/o significant large vessel stenosis; A1c 6.4; hyperlipidemia well controlled with home crestor. Neuro has been following. Feel there is concern for embolic source based on imaging showing evidence of old embolic infarcts. LE dopplers negative. He is undergoing TEE to check for embolic source; if negative will have  loop recorder placed to evaluate for afib. Will likely be discharged home after this. Patient was advised not to drive until outpatient follow-up with PCP and neurology.   -- Outpt PT/OT --ASA+plavix x3 wks then one alone (would favor plavix as patient was on daily asa 81mg  prior to hospitalization).  --continue crestor 20mg  daily   2. HTN: We were allowing permissive HTN, but suspect CVA occurred on Monday 7/1 based on patient history so resumed home losartan 100 mg yesterday.   3. HLD:  - Continue home Crestor 20 mg.   4. Depression with history of SI - Patient admits to struggling with depression for >10 years. He notes some SI in the past, but has not ever pursued anything for religious reasons. He has talked about his depression with his nephews who are pastors, but would like some professional help with this upon discharge. Denies any active SI.    Dispo: Anticipated discharge this afternoon after TEE, loop recorder placement.    Lenward ChancellorBloomfield, Muskan Bolla D, DO 10/27/2017, 12:23 PM Pager: [phone]
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
## 2017-10-27 NOTE — Interval H&P Note (Signed)
History and Physical Interval Note:  10/27/2017 11:36 AM  Jordan LynchJerry L Bergum  has presented today for surgery, with the diagnosis of EMBOLIC STROKE  The various methods of treatment have been discussed with the patient and family. After consideration of risks, benefits and other options for treatment, the patient has consented to  Procedure(s): TRANSESOPHAGEAL ECHOCARDIOGRAM (TEE) (N/A) as a surgical intervention .  The patient's history has been reviewed, patient examined, no change in status, stable for surgery.  I have reviewed the patient's chart and labs.  Questions were answered to the patient's satisfaction.     Dietrich PatesPaula Ross

## 2017-10-27 NOTE — Progress Notes (Signed)
Subjective: Overnight patient has done well. He has remained afebrile and hemodynamically stable. Patient continues to endorse complete resolution of symptoms. Denies chest pain, SOB, lightheadedness, dizziness or extremity weakness.   Of note, patient does endorse occasional SI but reports he has had recurrent SI since his retirement in 2008. Also discussed patient is to abstain from driving for one week, until after his follow up appointment. No other acute changes overnight.  Objective: Vital signs in last 24 hours: Vitals:   10/26/17 2347 10/27/17 0525 10/27/17 0818 10/27/17 1115  BP: (!) 144/79 (!) 153/73 (!) 176/72 (!) 182/68  Pulse: 76 63 64 64  Resp: 16 16 16 17   Temp: 98.3 F (36.8 C) 98 F (36.7 C) 98 F (36.7 C) 98.2 F (36.8 C)  TempSrc: Oral Oral Oral Oral  SpO2: 98% 100% 99% 100%  Weight:      Height:       Weight change:   Intake/Output Summary (Last 24 hours) at 10/27/2017 1140 Last data filed at 10/26/2017 1800 Gross per 24 hour  Intake 490 ml  Output -  Net 490 ml   Physical Exam  Constitutional: He is oriented to person, place, and time. He appears well-developed and well-nourished. No distress.  Cardiovascular: Normal rate, regular rhythm, normal heart sounds and intact distal pulses. Exam reveals no gallop and no friction rub.  No murmur heard. Pulmonary/Chest: Effort normal and breath sounds normal. No stridor. No respiratory distress. He has no wheezes. He has no rales. He exhibits no tenderness.  Abdominal: Soft. Bowel sounds are normal. He exhibits no distension. There is no tenderness.  Neurological: He is alert and oriented to person, place, and time.  Skin: Skin is warm and dry. No rash noted. No erythema.    Medications:  Prior to Admission:  Medications Prior to Admission  Medication Sig Dispense Refill Last Dose  . aspirin EC 81 MG tablet Take 81 mg by mouth daily.   Past Week at Unknown time  . losartan (COZAAR) 100 MG tablet Take 100 mg by  mouth daily.   Past Week at Unknown time  . rosuvastatin (CRESTOR) 20 MG tablet Take 20 mg by mouth daily.   Past Week at Unknown time  . tamsulosin (FLOMAX) 0.4 MG CAPS capsule Take 0.4 mg by mouth daily with supper.   Past Week at Unknown time   Scheduled Meds: . [MAR Hold]  stroke: mapping our early stages of recovery book   Does not apply Once  . [MAR Hold] aspirin EC  81 mg Oral Daily  . [MAR Hold] clopidogrel  75 mg Oral Daily  . [MAR Hold] enoxaparin (LOVENOX) injection  40 mg Subcutaneous Q24H  . [MAR Hold] losartan  100 mg Oral Daily  . [MAR Hold] rosuvastatin  20 mg Oral q1800  . [MAR Hold] tamsulosin  0.4 mg Oral Q supper   Continuous Infusions: . sodium chloride     PRN Meds:.[MAR Hold] acetaminophen **OR** [MAR Hold] acetaminophen (TYLENOL) oral liquid 160 mg/5 mL **OR** [MAR Hold] acetaminophen, [MAR Hold] senna-docusate   Assessment/Plan: Active Problems:   CVA (cerebral vascular accident) (HCC)   Hyperlipidemia   Essential hypertension   Ischemic stroke (HCC)  Jordan [name] is a 72 y.o. Male with PMHx significant for Hyperlipidemia and Essential Hypertension who presents with new onset ischemic right parietal CVA. Work up to date has been unremarkable. TTE revealed no thrombus or structural abnormality. Plan is to get TEE per Neuro for monitoring of atrial fibrillation and continue to  monitor for clinical improvement.  1. Acute/Subacute R Parietal CVA -TEE with loop recorder insertion today 10/27/17, possible discharge post-procedure -Outpatient PT/OT  -ASA+Plavix x3 weeks -Continue home Crestor 20 mg Qdaily  2. HTN: - Continue home Losartan 100 mg Qdaily   This is a Psychologist, occupational Note.  The care of the patient was discussed with Dr. Rogelia Boga and the assessment and plan formulated with their assistance.  Please see their attached note for official documentation of the daily encounter.   LOS: 4 days   Margret Chance, Medical Student 10/27/2017, 11:40 AM

## 2017-10-27 NOTE — Interval H&P Note (Signed)
History and Physical Interval Note:  10/27/2017 10:36 AM  Jordan LynchJerry L Jerde  has presented today for surgery, with the diagnosis of EMBOLIC STROKE  The various methods of treatment have been discussed with the patient and family. After consideration of risks, benefits and other options for treatment, the patient has consented to  Procedure(s): TRANSESOPHAGEAL ECHOCARDIOGRAM (TEE) (N/A) as a surgical intervention .  The patient's history has been reviewed, patient examined, no change in status, stable for surgery.  I have reviewed the patient's chart and labs.  Questions were answered to the patient's satisfaction.     Dietrich PatesPaula Bryahna Lesko

## 2017-10-27 NOTE — Progress Notes (Signed)
Physical Therapy Treatment Patient Details Name: Jordan [name] MRN: [id number] DOB: 01-12-1946 Today's Date: 10/27/2017    History of Present Illness Jordan [name] is a 72 y.o. male who presented to the ED with a 4 day history of headache, confusion, and feeling dizzy. MRI/CT head revealed a subacute right parietal lobe ischemic infarction. PMHx of HTN, HLD, and BPH.    PT Comments    Jordan [name] doing well today - feels as if his vision has improved, however, does continue to present with deficits while scanning to L side and performing activities that require obstacle navigation to L side. Able to perform safe stair navigation, but does require use of B handrails for steadying. Min guard to supervision with all mobility for general safety. Making good progress towards goals. PT recommendations remain appropriate.     Follow Up Recommendations  Outpatient PT;Supervision - Intermittent(neuro PT)     Equipment Recommendations  None recommended by PT    Recommendations for Other Services       Precautions / Restrictions Precautions Precautions: Fall Restrictions Weight Bearing Restrictions: No    Mobility  Bed Mobility Overal bed mobility: Modified Independent                Transfers Overall transfer level: Needs assistance Equipment used: None Transfers: Sit to/from Stand Sit to Stand: Supervision         General transfer comment: for general safety  Ambulation/Gait Ambulation/Gait assistance: Min guard;Supervision Gait Distance (Feet): 400 Feet Assistive device: None Gait Pattern/deviations: Step-through pattern;Decreased stride length;Narrow base of support Gait velocity: decreased   General Gait Details: continues to prefer a downward gaze; gait with scanning environment to left with 2 episodes of LOB requiring Min guard; noted visual deficts with weaving around objects with patient unable to do without prior demonstration from PT   Stairs Stairs:  Yes Stairs assistance: Min guard;Supervision Stair Management: Two rails;Alternating pattern;Forwards Number of Stairs: 10 General stair comments: good stability, but with use of B handrails   Wheelchair Mobility    Modified Rankin (Stroke Patients Only) Modified Rankin (Stroke Patients Only) Pre-Morbid Rankin Score: No symptoms Modified Rankin: Moderately severe disability     Balance Overall balance assessment: Needs assistance Sitting-balance support: No upper extremity supported;Feet supported Sitting balance-Leahy Scale: Good     Standing balance support: No upper extremity supported;During functional activity Standing balance-Leahy Scale: Fair                              Cognition Arousal/Alertness: Awake/alert Behavior During Therapy: WFL for tasks assessed/performed Overall Cognitive Status: No family/caregiver present to determine baseline cognitive functioning                                 General Comments: participating in normal conversation and aware of date/time of upcoming procedures      Exercises      General Comments        Pertinent Vitals/Pain Pain Assessment: No/denies pain    Home Living                      Prior Function            PT Goals (current goals can now be found in the care plan section) Acute Rehab PT Goals Patient Stated Goal: not stated PT Goal Formulation: With patient Time For Goal Achievement:  11/07/17 Potential to Achieve Goals: Good Progress towards PT goals: Progressing toward goals    Frequency    Min 4X/week      PT Plan Current plan remains appropriate    Co-evaluation              AM-PAC PT "6 Clicks" Daily Activity  Outcome Measure  Difficulty turning over in bed (including adjusting bedclothes, sheets and blankets)?: None Difficulty moving from lying on back to sitting on the side of the bed? : None Difficulty sitting down on and standing up from a  chair with arms (e.g., wheelchair, bedside commode, etc,.)?: Unable Help needed moving to and from a bed to chair (including a wheelchair)?: A Little Help needed walking in hospital room?: A Little Help needed climbing 3-5 steps with a railing? : A Little 6 Click Score: 18    End of Session Equipment Utilized During Treatment: Gait belt Activity Tolerance: Patient tolerated treatment well Patient left: in bed;with call bell/phone within reach Nurse Communication: Mobility status PT Visit Diagnosis: Unsteadiness on feet (R26.81);Other abnormalities of gait and mobility (R26.89);Muscle weakness (generalized) (M62.81)     Time: 1610-9604 PT Time Calculation (min) (ACUTE ONLY): 11 min  Charges:  $Gait Training: 8-22 mins                    G Codes:       Kipp Laurence, PT, DPT 10/27/17 9:19 AM

## 2017-10-27 NOTE — Progress Notes (Signed)
Pt c/o pain around loop recorder site, transparent dressing saturated with blood, blood leaking around edges of dressing, steri-strips saturated. Notified Dr. Gwyneth RevelsKrienke, came and evaluated, dressing was changed at bedside by MDs, no further bleeding noted.

## 2017-10-27 NOTE — Progress Notes (Signed)
  Echocardiogram Echocardiogram Transesophageal has been performed.  Jordan [name] G [name] 10/27/2017, 12:53 PM

## 2017-10-27 NOTE — Plan of Care (Signed)
Patient stable, discussed POC with patient, stroke education provided via stroke handbook, denies question/concerns at this time.

## 2017-10-27 NOTE — Progress Notes (Signed)
Occupational Therapy Treatment Patient Details Name: Jordan [name] MRN: [id number] DOB: 1946/03/04 Today's Date: 10/27/2017    History of present illness FIDENCIO DUDDY is a 72 y.o. male who presented to the ED with a 4 day history of headache, confusion, and feeling dizzy. MRI/CT head revealed a subacute right parietal lobe ischemic infarction. PMHx of HTN, HLD, and BPH.   OT comments  Pt progressing towards OT goals, presents sitting up in recliner pleasant and willing to partiicpate in session. Pt demonstrating room and hallway level functional mobility without AD and overall supervision. Pt continues to demonstrate visual deficits, appearing moreso to demonstrate R peripheral deficits vs L this session. Issued pt fine motor HEP and reviewed during session as well. POC remains appropriate at this time. Will continue to follow acutely to progress pt towards established OT goals.   Follow Up Recommendations  Outpatient OT;Supervision - Intermittent    Equipment Recommendations  Tub/shower seat          Precautions / Restrictions Precautions Precautions: Fall Restrictions Weight Bearing Restrictions: No       Mobility Bed Mobility               General bed mobility comments: OOB in recliner   Transfers Overall transfer level: Needs assistance Equipment used: None Transfers: Sit to/from Stand Sit to Stand: Supervision         General transfer comment: for general safety    Balance Overall balance assessment: Needs assistance Sitting-balance support: No upper extremity supported;Feet supported Sitting balance-Leahy Scale: Good     Standing balance support: No upper extremity supported;During functional activity Standing balance-Leahy Scale: Fair                             ADL either performed or assessed with clinical judgement   ADL Overall ADL's : Needs assistance/impaired Eating/Feeding: Independent                                    Functional mobility during ADLs: Supervision/safety General ADL Comments: issued Pt fine motor HEP and reviewed; pt completing room and hallway level functional mobility with focus on visual compensatory strategies using head turns, pt completing with min cues and supervision for dynamic balance      Vision   Additional Comments: pt appears to have increased difficulty locating items in R periphery vs L, practiced use of visual compensatory strategies during hallway mobility, completing with min cues               Cognition Arousal/Alertness: Awake/alert Behavior During Therapy: WFL for tasks assessed/performed Overall Cognitive Status: No family/caregiver present to determine baseline cognitive functioning                                 General Comments: participating in normal conversation and aware of date/time of upcoming procedures; overall appears [facility] this session         Exercises Other Exercises Other Exercises: performed fine motor exercises with Lt and R hand with fine motor HEP issued, including finger opposition    Shoulder Instructions       General Comments      Pertinent Vitals/ Pain       Pain Assessment: No/denies pain  Home Living  Prior Functioning/Environment              Frequency  Min 2X/week        Progress Toward Goals  OT Goals(current goals can now be found in the care plan section)  Progress towards OT goals: Progressing toward goals  Acute Rehab OT Goals Patient Stated Goal: not stated OT Goal Formulation: With patient Time For Goal Achievement: 11/07/17 Potential to Achieve Goals: Good  Plan Discharge plan remains appropriate    Co-evaluation                 AM-PAC PT "6 Clicks" Daily Activity     Outcome Measure   Help from another person eating meals?: None Help from another person taking care of personal grooming?: A Little Help  from another person toileting, which includes using toliet, bedpan, or urinal?: A Little Help from another person bathing (including washing, rinsing, drying)?: A Little Help from another person to put on and taking off regular upper body clothing?: A Little Help from another person to put on and taking off regular lower body clothing?: A Little 6 Click Score: 19    End of Session    OT Visit Diagnosis: Other (comment)(decreased cognition and coordination ) Hemiplegia - Right/Left: Left Hemiplegia - dominant/non-dominant: Non-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

## 2017-10-27 NOTE — CV Procedure (Signed)
TEE  LA, LA appendage,without masses No PFO by color doppler or with injection of agitated saline AV normal  Trivial AI TV normal  Trivial TR PV normal   Mild PI (eccentric) MV normal  Mild MR LVEF and RVEF normal Mild fixed plaquing on thoracic aorta.    Full report to follow    Jordan PatesPaula Hilbert [name]

## 2017-10-27 NOTE — Progress Notes (Signed)
OT Cancellation Note  Patient Details Name: Edmonia LynchJerry L Clayborn MRN: [id number] DOB: 1946/03/11   Cancelled Treatment:    Reason Eval/Treat Not Completed: Patient at procedure or test/ unavailable; will follow up as schedule permits.   Marcy SirenBreanna Rajan Burgard, OT Pager [phone] 10/27/2017   Orlando PennerBreanna L Colsen Modi 10/27/2017, 1:52 PM

## 2017-10-28 ENCOUNTER — Encounter (HOSPITAL_COMMUNITY): Payer: Self-pay | Admitting: Internal Medicine

## 2017-10-28 MED ORDER — ASPIRIN EC 81 MG PO TBEC: 81.0000 mg | DELAYED_RELEASE_TABLET | Freq: Every day | ORAL | 0 refills | Status: DC

## 2017-10-28 MED ORDER — CLOPIDOGREL BISULFATE 75 MG PO TABS: 75.0000 mg | ORAL_TABLET | Freq: Every day | ORAL | 3 refills | Status: DC

## 2017-10-28 MED ORDER — SERTRALINE HCL 50 MG PO TABS: 50.0000 mg | ORAL_TABLET | Freq: Every day | ORAL | 1 refills | Status: DC

## 2017-10-28 NOTE — Progress Notes (Signed)
Subjective: Overnight, patient has done well. He has remained afebrile and hemodynamically stable. He continues to endorse complete resolution of symptoms. Blood pressure is stable after restarting home Losartan. Patient tolerated TEE procedure well, though there does remain some mild bleeding around edges of dressing. Denies chest pain, SOB, lightheadedness, palpitations or n/v/d. No other acute changes overnight.   Patient consents to treatment of longstanding depressive symptoms with SSRI. Also discussed no driving or return to work until follow up appointment with Neurology.   Objective: Vital signs in last 24 hours: Vitals:   10/27/17 2002 10/28/17 0012 10/28/17 0417 10/28/17 0845  BP: (!) 143/69 (!) 98/52 (!) 162/79 (!) 148/82  Pulse: 80 65 66 90  Resp: 18 18 18 20   Temp: 98.7 F (37.1 C) 98.1 F (36.7 C) 98.2 F (36.8 C) 98.3 F (36.8 C)  TempSrc: Oral Oral Oral Oral  SpO2: 100% 99% 100% 100%  Weight:      Height:       Weight change:   Intake/Output Summary (Last 24 hours) at 10/28/2017 1154 Last data filed at 10/28/2017 1000 Gross per 24 hour  Intake 120 ml  Output -  Net 120 ml   Physical Exam  Constitutional: He is oriented to person, place, and time. He appears well-developed and well-nourished.  Cardiovascular: Normal rate, regular rhythm, normal heart sounds and intact distal pulses. Exam reveals no gallop and no friction rub.  No murmur heard. Pulmonary/Chest: Effort normal and breath sounds normal. No respiratory distress. He has no wheezes. He exhibits no tenderness.  Abdominal: Soft. Bowel sounds are normal. He exhibits no distension. There is no tenderness.  Neurological: He is alert and oriented to person, place, and time.  Skin: Skin is warm and dry.    Lab Results: No new labs to report.   Studies/Results: No results found. Medications:  Prior to Admission:  Medications Prior to Admission  Medication Sig Dispense Refill Last Dose  . aspirin EC 81  MG tablet Take 81 mg by mouth daily.   Past Week at Unknown time  . losartan (COZAAR) 100 MG tablet Take 100 mg by mouth daily.   Past Week at Unknown time  . rosuvastatin (CRESTOR) 20 MG tablet Take 20 mg by mouth daily.   Past Week at Unknown time  . tamsulosin (FLOMAX) 0.4 MG CAPS capsule Take 0.4 mg by mouth daily with supper.   Past Week at Unknown time   Scheduled Meds: .  stroke: mapping our early stages of recovery book   Does not apply Once  . aspirin EC  81 mg Oral Daily  . clopidogrel  75 mg Oral Daily  . heparin injection (subcutaneous)  5,000 Units Subcutaneous Q8H  . losartan  100 mg Oral Daily  . rosuvastatin  20 mg Oral q1800  . tamsulosin  0.4 mg Oral Q supper   Continuous Infusions: PRN Meds:.acetaminophen **OR** acetaminophen (TYLENOL) oral liquid 160 mg/5 mL **OR** acetaminophen, ondansetron (ZOFRAN) IV, senna-docusate Assessment/Plan: Active Problems:   CVA (cerebral vascular accident) (HCC)   Hyperlipidemia   Essential hypertension   Ischemic stroke (HCC)  Jordan [name] is a 72 y.o. Male with PMHx significant for Hyperlipidemia and Essential Hypertension who presents with new onset ischemic right parietal CVA. Work up to date has been unremarkable. Previous transesophageal echocardiogram demonstrated no structural abnormalities. CTA neck showed no aortic stenosis; MRA demonstrated no significant large vessel stenosis, and LE dopplers were negative. TTE revealed no thrombus or structural abnormality. Patient has now had complete CVA  work up and is s/p TEE with loop insertion to assess for a thrombotic etiology of his stroke.   1. Acute/Subacute R parietal CVA:  Loop recorder was placed yesterday for assessment of atrial fibrillation.  -Outpatient PT/OT -ASA+Plavix x3 weeks then one alone  2. HTN: -Continue home Losartan 20 mg Qdaily  3. HLD: -Continue home Crestor 20 mg Qdaily.  4. Depression w/ history of SI: -Patient has expressed depressive symptoms for >10  years. Endorses history of SI. Patient consents to treatment with SSRI. Will evaluate best pharmacological therapy given patient's history and risk factors. Will recommend follow up of mood symptoms with PCP.     This is a Psychologist, occupational Note.  The care of the patient was discussed with Dr. Rogelia Boga and the assessment and plan formulated with their assistance.  Please see their attached note for official documentation of the daily encounter.   LOS: 5 days   Margret Chance, Medical Student 10/28/2017, 11:54 AM

## 2017-10-28 NOTE — Progress Notes (Signed)
Physical Therapy Treatment Patient Details Name: Jordan [name] MRN: [id number] DOB: 02-08-1946 Today's Date: 10/28/2017    History of Present Illness Jordan [name] is a 72 y.o. male who presented to the ED with a 4 day history of headache, confusion, and feeling dizzy. MRI/CT head revealed a subacute right parietal lobe ischemic infarction. PMHx of HTN, HLD, and BPH.    PT Comments    Mr. Rieman doing well today. Reports some "blurriness" of R eye, but not impacting gait or stair navigation. Improved safety awareness and awareness of surroundings today without needing verbal cueing for obstacles in path. Improved stair navigation today with only requiring single handrail - patient reports improved confidence with this activity. Making good progress towards goals.     Follow Up Recommendations  Outpatient PT;Supervision - Intermittent(neuro PT)     Equipment Recommendations  None recommended by PT    Recommendations for Other Services       Precautions / Restrictions Precautions Precautions: Fall Restrictions Weight Bearing Restrictions: No    Mobility  Bed Mobility               General bed mobility comments: OOB in recliner   Transfers Overall transfer level: Needs assistance Equipment used: None Transfers: Sit to/from Stand Sit to Stand: Supervision         General transfer comment: for safety - no LOB  Ambulation/Gait Ambulation/Gait assistance: Supervision Gait Distance (Feet): 500 Feet Assistive device: None Gait Pattern/deviations: Step-through pattern;Decreased stride length;Narrow base of support Gait velocity: decreased   General Gait Details: downward gaze; improved obstacle navigation today; reports R eye blurriness   Stairs Stairs: Yes Stairs assistance: Supervision Stair Management: One rail Right;Alternating pattern;Forwards Number of Stairs: 18 General stair comments: improved navigation today requiring only a single rail; reports improved  confidence with stairs   Wheelchair Mobility    Modified Rankin (Stroke Patients Only) Modified Rankin (Stroke Patients Only) Pre-Morbid Rankin Score: No symptoms Modified Rankin: Moderate disability     Balance Overall balance assessment: Needs assistance Sitting-balance support: No upper extremity supported;Feet supported Sitting balance-Leahy Scale: Good     Standing balance support: No upper extremity supported;During functional activity Standing balance-Leahy Scale: Good                              Cognition Arousal/Alertness: Awake/alert Behavior During Therapy: WFL for tasks assessed/performed Overall Cognitive Status: Within Functional Limits for tasks assessed                                        Exercises      General Comments        Pertinent Vitals/Pain Pain Assessment: No/denies pain    Home Living                      Prior Function            PT Goals (current goals can now be found in the care plan section) Acute Rehab PT Goals Patient Stated Goal: return home PT Goal Formulation: With patient Time For Goal Achievement: 11/07/17 Potential to Achieve Goals: Good Progress towards PT goals: Progressing toward goals    Frequency    Min 4X/week      PT Plan Current plan remains appropriate    Co-evaluation  AM-PAC PT "6 Clicks" Daily Activity  Outcome Measure  Difficulty turning over in bed (including adjusting bedclothes, sheets and blankets)?: None Difficulty moving from lying on back to sitting on the side of the bed? : None Difficulty sitting down on and standing up from a chair with arms (e.g., wheelchair, bedside commode, etc,.)?: A Little Help needed moving to and from a bed to chair (including a wheelchair)?: A Little Help needed walking in hospital room?: A Little Help needed climbing 3-5 steps with a railing? : A Little 6 Click Score: 20    End of Session  Equipment Utilized During Treatment: Gait belt Activity Tolerance: Patient tolerated treatment well Patient left: in chair;with call bell/phone within reach Nurse Communication: Mobility status PT Visit Diagnosis: Unsteadiness on feet (R26.81);Other abnormalities of gait and mobility (R26.89);Muscle weakness (generalized) (M62.81)     Time: [id number] PT Time Calculation (min) (ACUTE ONLY): 16 min  Charges:  $Gait Training: 8-22 mins                    G Codes:       Kipp LaurenceStephanie R Aaron, PT, DPT 10/28/17 9:39 AM Pager: [phone]

## 2017-10-28 NOTE — Care Management Note (Signed)
Case Management Note  Patient Details  Name: Jordan [name] MRN: [id number] Date of Birth: 05/13/1945  Subjective/Objective:                    Action/Plan: CM met with the patient to discuss disability. Pt already receiving a check from [employer]. Pt does still work in the evenings and feels he can return to this job next week. Pt encouraged to talk to DSS for any questions regarding disability or his SS.   Expected Discharge Date:  10/28/17               Expected Discharge Plan:  OP Rehab  In-House Referral:     Discharge planning Services  CM Consult  Post Acute Care Choice:  Durable Medical Equipment Choice offered to:  Patient  DME Arranged:  3-N-1 DME Agency:  [place]:    [facility] Agency:     Status of Service:  Completed, signed off  If discussed at [place] of Stay Meetings, dates discussed:    Additional Comments:  Pollie Friar, RN 10/28/2017, 1:47 PM

## 2017-10-28 NOTE — Progress Notes (Signed)
  Date: 10/28/2017  Patient name: Jordan [name]  Medical record number: [id number]  Date of birth: Jan 16, 1946   I have seen and evaluated this patient and I have discussed the plan of care with the house staff. Please see their note for complete details. I concur with their findings with the following additions/corrections: Jordan [name] was seen on AM rounds with team. He is agreeable to start Zoloft and F/U with his PCP. No driving or working until sees PCP. Stable for d/C home.  Burns SpainButcher, Elizabeth A, MD 10/28/2017, 2:55 PM

## 2017-10-28 NOTE — Progress Notes (Signed)
Patient discharged home. Discharge instructions were reviewed with the patient. Pt verbalized understanding.

## 2017-10-28 NOTE — Progress Notes (Signed)
   Subjective: Patient was seen and evaluated at bedside. No acute events overnight. He had his loop recorder placed yesterday. He states he had a little soreness at the incision site, but that has resolved. Denies lightheadedness, feeling of "foggy headed" that he came in with, chest pain, shortness of breath, palpitations, or n/v/d.  We discussed the possibility of starting medical therapy for his longstanding treatment. We explained the success of SSRIs for his symptoms, as well as the potential side effects. He was in agreement to starting one on discharge and will follow up with his primary care doctor.   Objective:  Vital signs in last 24 hours: Vitals:   10/28/17 0012 10/28/17 0417 10/28/17 0845 10/28/17 1331  BP: (!) 98/52 (!) 162/79 (!) 148/82 (!) 147/67  Pulse: 65 66 90 73  Resp: 18 18 20 16   Temp: 98.1 F (36.7 C) 98.2 F (36.8 C) 98.3 F (36.8 C) 98.3 F (36.8 C)  TempSrc: Oral Oral Oral Oral  SpO2: 99% 100% 100% 100%  Weight:      Height:       Physical exam General: A&Ox3. He is sitting up in bed in NAD.  Cardiovascular: normal rate, regular rhythm. No murmurs, gallops, or rubs.  Respiratory: no increased work of breathing. Lungs clear to auscultation in all fields bilaterally. Abdominal: bowel sounds +. Abdomen is soft, non-distended, non-tender Ext: no bilateral lower extremity edema  Assessment/Plan:  Active Problems:   CVA (cerebral vascular accident) (HCC)   Hyperlipidemia   Essential hypertension   Ischemic stroke (HCC)  1. Acute/Subacute R parietal CVA: Work up inpatient unrevealing; echo without thrombus or significant structural abnormality; CTA neck w/o significant carotid stenosis; MRA w/o significant large vessel stenosis; A1c 6.4; hyperlipidemia well controlled with home crestor. Neuro has been following. Feel there is concern for embolic source based on imaging showing evidence of old embolic infarcts. LE dopplers negative.TEE showed no evidence of  thrombus or PFO. Cardiology placed him on loop recorder to monitor for paroxysmal afib and will follow-up outpatient. Patient was advised not to drive until outpatient follow-up with PCP and neurology.  -- Outpt PT/OT --ASA+plavix x3 wks then one alone (would favor plavix as patient was on daily asa 81mg  prior to hospitalization).  --continue crestor 20mg  daily   2. HTN: Continue home losartan 100 mg yesterday.   3. HLD:  - Continue home Crestor 20 mg.   4. Depression with history of SI - Patient admits to struggling with depression for >10 years. He notes some SI in the past, but has not ever pursued anything for religious reasons. He has talked about his depression with his nephews who are pastors, but would like some professional help with this upon discharge. Denies any active SI.  We have started him on Zoloft 50 mg and will have him follow-up with his primary care doctor to adjust as needed.      Dispo: Patient will be discharged home today.   Lenward ChancellorBloomfield, Joyce Leckey D, DO 10/28/2017, 1:49 PM Pager: [phone]

## 2017-11-07 ENCOUNTER — Ambulatory Visit: Payer: Medicare HMO | Admitting: Psychology

## 2017-11-07 DIAGNOSIS — F321 Major depressive disorder, single episode, moderate: Secondary | ICD-10-CM

## 2017-12-01 ENCOUNTER — Ambulatory Visit (INDEPENDENT_AMBULATORY_CARE_PROVIDER_SITE_OTHER): Payer: Medicare HMO | Admitting: *Deleted

## 2017-12-01 DIAGNOSIS — I639 Cerebral infarction, unspecified: Secondary | ICD-10-CM

## 2017-12-01 NOTE — Progress Notes (Signed)
[facility] Summary Report / Loop Recorder 

## 2017-12-04 ENCOUNTER — Ambulatory Visit: Payer: Medicare HMO | Admitting: Adult Health

## 2017-12-15 ENCOUNTER — Ambulatory Visit: Payer: Medicare HMO | Admitting: Adult Health

## 2017-12-23 NOTE — Progress Notes (Signed)
[facility] [address] [place]. [place] [address] [phone]       OFFICE FOLLOW UP NOTE  Jordan. Jordan [name] Date of Birth:  Apr 26, 1945 Medical Record Number:  [id number]   Reason for Referral:  hospital stroke follow up  CHIEF COMPLAINT:  Chief Complaint  Patient presents with  . 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 History obtained from patient and chart review. Reviewed all radiology images and labs personally.  Jordan. Jordan [name] is a 72 y.o. male with history of HTN, HLD, BPH presenting with confusion, dizziness, left hemianopia, and HA without head trauma.  CT head reviewed and showed subacute right parietal infarct along with old infarct and small vessel disease.  MRI head reviewed and showed right parietal infarct along with old right paramedial frontal infarct, bilateral BG lacunes and numerous microhemorrhages throughout.  MRA was negative for emergent LVO but did show arthrosclerosis with multiple mild stenosis.  CTA neck showed arthrosclerotic plaque in the ICA bulb on the left with only 10% stenosis and mild aortic arch arthrosclerosis.  2D echo showed an EF of 55 to 60%.  TEE was negative for atrial thrombus therefore loop recorder was placed to rule out atrial fibrillation.  LDL 75 and recommended continuation of Crestor 20 mg.  A1c 6.4.  HTN stable during admission and recommended long-term BP goal normotensive range.  Patient was not on antithrombotic PTA and recommended DAPT with aspirin 81 mg and Plavix 75 mg for 3 weeks then Plavix alone.  Patient was discharged home in stable condition.  Patient is being seen today for hospital follow-up.  He states he has been doing well without residual deficits or recurrence of symptoms.  He has returned back to all prior activities.  He has continued to  take both aspirin 81 mg and Plavix as he was unaware that this should have only been for 3 weeks but denies bleeding or bruising.  Continues to take Crestor 20mg  without side effects of myalgias.  Blood pressure at today's appointment 141/81 but patient states he has not taken any morning medications at this time as he has not had breakfast yet.  Spoke to patient regarding Jordan [name] trial and he is interested in participating.  Denies new or worsening stroke/TIA symptoms.   ROS:   14 system review of systems performed and negative with exception of insomnia, urine decrease and muscle cramps  PMH:  Past Medical History:  Diagnosis Date  . CVA (cerebral vascular accident) (HCC) 10/23/2017   CT confirms subacute R parietal infarct  . GERD (gastroesophageal reflux disease)   . High cholesterol   . Hypertension     PSH:  Past Surgical History:  Procedure Laterality Date  . LOOP RECORDER INSERTION N/A 10/27/2017   Procedure: LOOP RECORDER INSERTION;  Surgeon: Marinus Maw, MD;  Location: [facility] INVASIVE CV LAB;  Service: Cardiovascular;  Laterality: N/A;  . NO PAST SURGERIES    . TEE WITHOUT CARDIOVERSION N/A 10/27/2017   Procedure: TRANSESOPHAGEAL ECHOCARDIOGRAM (TEE);  Surgeon: Pricilla Riffle, MD;  Location: [facility] ENDOSCOPY;  Service: Cardiovascular;  Laterality: N/A;    Social History:  Social History   Socioeconomic History  . Marital status: Divorced    Spouse name: Not on file  . Number of children: Not on file  .  Years of education: Not on file  . Highest education level: Not on file  Occupational History  . Not on file  Social Needs  . Financial resource strain: Not on file  . Food insecurity:    Worry: Not on file    Inability: Not on file  . Transportation needs:    Medical: Not on file    Non-medical: Not on file  Tobacco Use  . Smoking status: Former Smoker    Packs/day: 0.12    Years: 10.00    Pack years: 1.20    Types: Cigarettes, Pipe    Last attempt to quit: 1990     Years since quitting: 29.6  . Smokeless tobacco: Never Used  Substance and Sexual Activity  . Alcohol use: Never    Frequency: Never  . Drug use: Never  . Sexual activity: Not on file  Lifestyle  . Physical activity:    Days per week: Not on file    Minutes per session: Not on file  . Stress: Not on file  Relationships  . Social connections:    Talks on phone: Not on file    Gets together: Not on file    Attends religious service: Not on file    Active member of club or organization: Not on file    Attends meetings of clubs or organizations: Not on file    Relationship status: Not on file  . Intimate partner violence:    Fear of current or ex partner: Not on file    Emotionally abused: Not on file    Physically abused: Not on file    Forced sexual activity: Not on file  Other Topics Concern  . Not on file  Social History Narrative  . Not on file    Family History:  Family History  Problem Relation Age of Onset  . CVA Mother 10  . Stroke Mother   . Heart attack Father        39  . CVA Father   . Stroke Father     Medications:   Current Outpatient Medications on File Prior to Visit  Medication Sig Dispense Refill  . clopidogrel (PLAVIX) 75 MG tablet Take 1 tablet (75 mg total) by mouth daily. 90 tablet 3  . losartan (COZAAR) 100 MG tablet Take 100 mg by mouth daily.    . Omega-3 Fatty Acids (FISH OIL PO) Take by mouth.    . rosuvastatin (CRESTOR) 20 MG tablet Take 20 mg by mouth daily.    . sertraline (ZOLOFT) 50 MG tablet Take 1 tablet (50 mg total) by mouth daily. 30 tablet 1  . tamsulosin (FLOMAX) 0.4 MG CAPS capsule Take 0.4 mg by mouth daily with supper.     No current facility-administered medications on file prior to visit.     Allergies:  No Known Allergies   Physical Exam  Vitals:   12/24/17 0842  BP: (!) 141/81  Pulse: 73  Weight: 169 lb 12.8 oz (77 kg)  Height: 5\' 11"  (1.803 m)   Body mass index is 23.68 kg/m. No exam data  present  General: well developed, well nourished, pleasant elderly African-American male, seated, in no evident distress Head: head normocephalic and atraumatic.   Neck: supple with no carotid or supraclavicular bruits Cardiovascular: regular rate and rhythm, no murmurs Musculoskeletal: no deformity Skin:  no rash/petichiae Vascular:  Normal pulses all extremities  Neurologic Exam Mental Status: Awake and fully alert. Oriented to place and time. Recent and remote memory  intact. Attention span, concentration and fund of knowledge appropriate. Mood and affect appropriate.  Cranial Nerves: Fundoscopic exam reveals sharp disc margins. Pupils equal, briskly reactive to light. Extraocular movements full without nystagmus. Visual fields full to confrontation. Hearing intact. Facial sensation intact. Face, tongue, palate moves normally and symmetrically.  Motor: Normal bulk and tone. Normal strength in all tested extremity muscles. Sensory.: intact to touch , pinprick , position and vibratory sensation.  Coordination: Rapid alternating movements normal in all extremities. Finger-to-nose and heel-to-shin performed accurately bilaterally. Gait and Station: Arises from chair without difficulty. Stance is normal. Gait demonstrates normal stride length and balance . Able to heel, toe and tandem walk without difficulty.  Reflexes: 1+ and symmetric. Toes downgoing.    NIHSS  0 Modified Rankin  0   Diagnostic Data (Labs, Imaging, Testing)  Ct Head Wo Contrast Result Date: 10/23/2017 IMPRESSION:  1. Subacute right parietal lobe infarct.  2. No other recent abnormality.  3. Old infarcts and moderate chronic microvascular ischemic change.  4. No intracranial hemorrhage.   Ct Angio Neck W Or Wo Contrast 10/24/2017 IMPRESSION:  Aortic atherosclerosis and tortuosity consistent with the history of hypertension. No aneurysm or dissection. Atherosclerotic plaque at the ICA bulb on the left, but with  stenosis of only 10%. No stenosis at the left carotid bifurcation. No posterior circulation stenosis.   Jordan [name] Head Wo Contrast Result Date: 10/24/2017 MRI head:  1. Right parietal acute/early subacute infarction measuring up to 4.4 cm, 15 cc. No associated hemorrhage or mass effect.  2. Moderate chronic microvascular ischemic changes and parenchymal volume loss of the brain.  3. Small chronic infarct in the right paramedian frontal lobe. Several small chronic lacunar infarcts in basal ganglia.  4. Numerous nonspecific foci of chronic microhemorrhage diffusely throughout the brain probably related to chronic hypertension, less likely amyloid vasculopathy. Additionally, sequelae of traumatic brain injury, granulomatous disease, or vasculitis/vasculopathy could have this appearance.  MRA head:  Patent anterior and posterior intracranial circulation. No large vessel occlusion, high-grade stenosis, or aneurysm. Atherosclerosis with multiple segments of mild stenosis.   LE venous Dopplers   10/24/2017 No evidence of DVT.  TTE 10/24/2017 Study Conclusions - Left ventricle: The cavity size was normal. Wall thickness was   increased in a pattern of mild LVH. There was mild focal basal   hypertrophy of the septum. Systolic function was normal. The   estimated ejection fraction was in the range of 55% to 60%. Wall   motion was normal; there were no regional wall motion   abnormalities. Doppler parameters are consistent with abnormal   left ventricular relaxation (grade 1 diastolic dysfunction). - Aortic valve: There was trivial regurgitation.  TEE 10/27/2017 Study Conclusions - Left atrium: No evidence of thrombus in the atrial cavity or   appendage. No evidence of thrombus in the atrial cavity or   appendage.  Loop recorder 10/27/2017    ASSESSMENT: Jordan [name] is a 72 y.o. year old male here with right parietal infarct on 10/23/2017 secondary to old embolic infarcts with unknown source.  Vascular risk factors include HTN and HLD.  Loop recorder placed on 10/27/2017 to rule out atrial fibrillation as cause of stroke.  Patient is being seen today for hospital follow-up and overall doing well without residual deficits.    PLAN: -Continue clopidogrel 75 mg daily  and Crestor 20mg   for secondary stroke prevention -Stop aspirin at this time as >3 weeks DAPT -F/u with PCP regarding your HLD and HTN management -Patient  was provided with information regarding participation in [place] trial by research team -continue to monitor BP at home -continue to monitor loop recorder for atrial fibrillation -advised to continue to stay active and maintain a healthy diet -Maintain strict control of hypertension with blood pressure goal below 130/90, diabetes with hemoglobin A1c goal below 6.5% and cholesterol with LDL cholesterol (bad cholesterol) goal below 70 mg/dL. I also advised the patient to eat a healthy diet with plenty of whole grains, cereals, fruits and vegetables, exercise regularly and maintain ideal body weight.  Follow up in 6 months or call earlier if needed   Greater than 50% of time during this 25 minute visit was spent on counseling,explanation of diagnosis of right parietal infarct, reviewing risk factor management of HLD and HTN, planning of further management, discussion with patient and family and coordination of care    George Hugh, AGNP-BC  [facility] [address] [place], [place] [address]  Phone [phone] Fax [phone] Note: This document was prepared with digital dictation and possible smart phrase technology. Any transcriptional errors that result from this process are unintentional.

## 2017-12-24 ENCOUNTER — Ambulatory Visit: Payer: Medicare HMO | Admitting: Adult Health

## 2017-12-24 ENCOUNTER — Encounter: Payer: Self-pay | Admitting: Adult Health

## 2017-12-24 VITALS — BP 141/81 | HR 73 | Ht 71.0 in | Wt 169.8 lb

## 2017-12-24 DIAGNOSIS — E785 Hyperlipidemia, unspecified: Secondary | ICD-10-CM

## 2017-12-24 DIAGNOSIS — I10 Essential (primary) hypertension: Secondary | ICD-10-CM | POA: Diagnosis not present

## 2017-12-24 DIAGNOSIS — I6389 Other cerebral infarction: Secondary | ICD-10-CM | POA: Diagnosis not present

## 2017-12-24 DIAGNOSIS — Z95818 Presence of other cardiac implants and grafts: Secondary | ICD-10-CM | POA: Diagnosis not present

## 2017-12-24 NOTE — Patient Instructions (Addendum)
Continue clopidogrel 75 mg daily  and Crestor 20mg   for secondary stroke prevention  Stop aspirin at this time and continue plavix only  Continue to follow up with PCP regarding cholesterol and blood pressure management   We will continue to monitor loop recorder for potential atrial fibrillation  Continue to stay active and maintain a healthy diet  Maintain strict control of hypertension with blood pressure goal below 130/90, diabetes with hemoglobin A1c goal below 6.5% and cholesterol with LDL cholesterol (bad cholesterol) goal below 70 mg/dL. I also advised the patient to eat a healthy diet with plenty of whole grains, cereals, fruits and vegetables, exercise regularly and maintain ideal body weight.  Followup in the future with me in 6 months or call earlier if needed       Thank you for coming to see [place] at [facility]. I hope we have been able to provide you high quality care today.  You may receive a patient satisfaction survey over the next few weeks. We would appreciate your feedback and comments so that we may continue to improve ourselves and the health of our patients.    Stroke Prevention Some health problems and behaviors may make it more likely for you to have a stroke. Below are ways to lessen your risk of having a stroke.  Be active for at least 30 minutes on most or all days.  Do not smoke. Try not to be around others who smoke.  Do not drink too much alcohol. ? Do not have more than 2 drinks a day if you are a man. ? Do not have more than 1 drink a day if you are a woman and are not pregnant.  Eat healthy foods, such as fruits and vegetables. If you were put on a specific diet, follow the diet as told.  Keep your cholesterol levels under control through diet and medicines. Look for foods that are low in saturated fat, trans fat, cholesterol, and are high in fiber.  If you have diabetes, follow all diet plans and take your medicine as  told.  Ask your doctor if you need treatment to lower your blood pressure. If you have high blood pressure (hypertension), follow all diet plans and take your medicine as told by your doctor.  If you are 14-66 years old, have your blood pressure checked every 3-5 years. If you are age 98 or older, have your blood pressure checked every year.  Keep a healthy weight. Eat foods that are low in calories, salt, saturated fat, trans fat, and cholesterol.  Do not take drugs.  Avoid birth control pills, if this applies. Talk to your doctor about the risks of taking birth control pills.  Talk to your doctor if you have sleep problems (sleep apnea).  Take all medicine as told by your doctor. ? You may be told to take aspirin or blood thinner medicine. Take this medicine as told by your doctor. ? Understand your medicine instructions.  Make sure any other conditions you have are being taken care of.  Get help right away if:  You suddenly lose feeling (you feel numb) or have weakness in your face, arm, or leg.  Your face or eyelid hangs down to one side.  You suddenly feel confused.  You have trouble talking (aphasia) or understanding what people are saying.  You suddenly have trouble seeing in one or both eyes.  You suddenly have trouble walking.  You are dizzy.  You lose your balance  or your movements are clumsy (uncoordinated).  You suddenly have a very bad headache and you do not know the cause.  You have new chest pain.  Your heart feels like it is fluttering or skipping a beat (irregular heartbeat). Do not wait to see if the symptoms above go away. Get help right away. Call your local emergency services (911 in [place] Do not drive yourself to the hospital. This information is not intended to replace advice given to you by your health care provider. Make sure you discuss any questions you have with your health care provider. Document Released: 10/08/2011 Document Revised:  09/14/2015 Document Reviewed: 10/09/2012 Elsevier Interactive Patient Education  [employer].

## 2017-12-26 NOTE — Progress Notes (Signed)
I agree with the above plan 

## 2018-01-01 ENCOUNTER — Ambulatory Visit (INDEPENDENT_AMBULATORY_CARE_PROVIDER_SITE_OTHER): Payer: Medicare HMO | Admitting: *Deleted

## 2018-01-01 DIAGNOSIS — I639 Cerebral infarction, unspecified: Secondary | ICD-10-CM

## 2018-01-02 NOTE — Progress Notes (Signed)
[facility] Summary Report / Loop Recorder 

## 2018-01-07 LAB — CUP PACEART REMOTE DEVICE CHECK
Date Time Interrogation Session: 20190810183513
MDC IDC PG IMPLANT DT: 20190708

## 2018-01-16 LAB — CUP PACEART REMOTE DEVICE CHECK
Implantable Pulse Generator Implant Date: 20190708
MDC IDC SESS DTM: 20190912183712

## 2018-02-03 ENCOUNTER — Ambulatory Visit (INDEPENDENT_AMBULATORY_CARE_PROVIDER_SITE_OTHER): Payer: Medicare HMO | Admitting: *Deleted

## 2018-02-03 DIAGNOSIS — I639 Cerebral infarction, unspecified: Secondary | ICD-10-CM

## 2018-02-04 NOTE — Progress Notes (Signed)
[facility] Summary Report / Loop Recorder 

## 2018-02-16 LAB — CUP PACEART REMOTE DEVICE CHECK
Implantable Pulse Generator Implant Date: 20190708
MDC IDC SESS DTM: 20191015183826

## 2018-03-09 ENCOUNTER — Ambulatory Visit (INDEPENDENT_AMBULATORY_CARE_PROVIDER_SITE_OTHER): Payer: Medicare HMO

## 2018-03-09 ENCOUNTER — Emergency Department (HOSPITAL_COMMUNITY): Payer: Medicare HMO

## 2018-03-09 ENCOUNTER — Observation Stay (HOSPITAL_COMMUNITY)
Admission: EM | Admit: 2018-03-09 | Discharge: 2018-03-09 | Disposition: A | Discharge status: Home / Self Care | Payer: Medicare HMO | Attending: Oncology | Admitting: Oncology

## 2018-03-09 ENCOUNTER — Other Ambulatory Visit: Payer: Self-pay

## 2018-03-09 ENCOUNTER — Encounter (HOSPITAL_COMMUNITY): Payer: Self-pay | Admitting: Emergency Medicine

## 2018-03-09 DIAGNOSIS — F329 Major depressive disorder, single episode, unspecified: Secondary | ICD-10-CM | POA: Diagnosis not present

## 2018-03-09 DIAGNOSIS — R079 Chest pain, unspecified: Secondary | ICD-10-CM | POA: Diagnosis present

## 2018-03-09 DIAGNOSIS — I119 Hypertensive heart disease without heart failure: Secondary | ICD-10-CM | POA: Insufficient documentation

## 2018-03-09 DIAGNOSIS — Z7982 Long term (current) use of aspirin: Secondary | ICD-10-CM | POA: Insufficient documentation

## 2018-03-09 DIAGNOSIS — Z823 Family history of stroke: Secondary | ICD-10-CM

## 2018-03-09 DIAGNOSIS — E119 Type 2 diabetes mellitus without complications: Secondary | ICD-10-CM | POA: Diagnosis not present

## 2018-03-09 DIAGNOSIS — K219 Gastro-esophageal reflux disease without esophagitis: Secondary | ICD-10-CM

## 2018-03-09 DIAGNOSIS — E7800 Pure hypercholesterolemia, unspecified: Secondary | ICD-10-CM | POA: Insufficient documentation

## 2018-03-09 DIAGNOSIS — I639 Cerebral infarction, unspecified: Secondary | ICD-10-CM

## 2018-03-09 DIAGNOSIS — Z8249 Family history of ischemic heart disease and other diseases of the circulatory system: Secondary | ICD-10-CM | POA: Diagnosis not present

## 2018-03-09 DIAGNOSIS — Z8673 Personal history of transient ischemic attack (TIA), and cerebral infarction without residual deficits: Secondary | ICD-10-CM

## 2018-03-09 DIAGNOSIS — R61 Generalized hyperhidrosis: Secondary | ICD-10-CM | POA: Diagnosis not present

## 2018-03-09 DIAGNOSIS — Z87891 Personal history of nicotine dependence: Secondary | ICD-10-CM | POA: Insufficient documentation

## 2018-03-09 DIAGNOSIS — N400 Enlarged prostate without lower urinary tract symptoms: Secondary | ICD-10-CM | POA: Diagnosis not present

## 2018-03-09 DIAGNOSIS — E785 Hyperlipidemia, unspecified: Secondary | ICD-10-CM

## 2018-03-09 DIAGNOSIS — Z79899 Other long term (current) drug therapy: Secondary | ICD-10-CM | POA: Diagnosis not present

## 2018-03-09 DIAGNOSIS — Z7902 Long term (current) use of antithrombotics/antiplatelets: Secondary | ICD-10-CM

## 2018-03-09 DIAGNOSIS — I447 Left bundle-branch block, unspecified: Secondary | ICD-10-CM | POA: Insufficient documentation

## 2018-03-09 DIAGNOSIS — Z95818 Presence of other cardiac implants and grafts: Secondary | ICD-10-CM | POA: Insufficient documentation

## 2018-03-09 DIAGNOSIS — I10 Essential (primary) hypertension: Secondary | ICD-10-CM

## 2018-03-09 DIAGNOSIS — R0789 Other chest pain: Secondary | ICD-10-CM | POA: Diagnosis not present

## 2018-03-09 HISTORY — DX: Benign prostatic hyperplasia without lower urinary tract symptoms: N40.0

## 2018-03-09 LAB — CBC
HCT: 45.4 % (ref 39.0–52.0)
Hemoglobin: 14 g/dL (ref 13.0–17.0)
MCH: 28.5 pg (ref 26.0–34.0)
MCHC: 30.8 g/dL (ref 30.0–36.0)
MCV: 92.5 fL (ref 80.0–100.0)
NRBC: 0 % (ref 0.0–0.2)
PLATELETS: 231 10*3/uL (ref 150–400)
RBC: 4.91 MIL/uL (ref 4.22–5.81)
RDW: 13.1 % (ref 11.5–15.5)
WBC: 5.7 10*3/uL (ref 4.0–10.5)

## 2018-03-09 LAB — BASIC METABOLIC PANEL
Anion gap: 6 (ref 5–15)
BUN: 9 mg/dL (ref 8–23)
CHLORIDE: 108 mmol/L (ref 98–111)
CO2: 26 mmol/L (ref 22–32)
Calcium: 9.2 mg/dL (ref 8.9–10.3)
Creatinine, Ser: 1.39 mg/dL — ABNORMAL HIGH (ref 0.61–1.24)
GFR calc non Af Amer: 49 mL/min — ABNORMAL LOW (ref >60)
GFR, EST AFRICAN AMERICAN: 57 mL/min — AB (ref >60)
Glucose, Bld: 121 mg/dL — ABNORMAL HIGH (ref 70–99)
POTASSIUM: 4.5 mmol/L (ref 3.5–5.1)
Sodium: 140 mmol/L (ref 135–145)

## 2018-03-09 LAB — I-STAT TROPONIN, ED: Troponin i, poc: 0 ng/mL (ref 0.00–0.08)

## 2018-03-09 LAB — TROPONIN I

## 2018-03-09 MED ORDER — TAMSULOSIN HCL 0.4 MG PO CAPS
0.4000 mg | ORAL_CAPSULE | Freq: Every day | ORAL | Status: DC
  Administered 2018-03-09: 0.4 mg via ORAL
  Filled 2018-03-09: qty 1

## 2018-03-09 MED ORDER — SENNOSIDES-DOCUSATE SODIUM 8.6-50 MG PO TABS: 1.0000 | ORAL_TABLET | Freq: Every evening | ORAL | Status: DC | PRN

## 2018-03-09 MED ORDER — ACETAMINOPHEN 650 MG RE SUPP: 650.0000 mg | Freq: Four times a day (QID) | RECTAL | Status: DC | PRN

## 2018-03-09 MED ORDER — LOSARTAN POTASSIUM 50 MG PO TABS
100.0000 mg | ORAL_TABLET | Freq: Every day | ORAL | Status: DC
  Administered 2018-03-09: 100 mg via ORAL
  Filled 2018-03-09: qty 2

## 2018-03-09 MED ORDER — NITROGLYCERIN 0.4 MG SL SUBL: 0.4000 mg | SUBLINGUAL_TABLET | SUBLINGUAL | 0 refills | Status: DC | PRN

## 2018-03-09 MED ORDER — NITROGLYCERIN 0.4 MG SL SUBL: 0.4000 mg | SUBLINGUAL_TABLET | SUBLINGUAL | Status: DC | PRN

## 2018-03-09 MED ORDER — LOSARTAN POTASSIUM 50 MG PO TABS: 100.0000 mg | ORAL_TABLET | Freq: Every day | ORAL | Status: DC

## 2018-03-09 MED ORDER — ROSUVASTATIN CALCIUM 10 MG PO TABS
20.0000 mg | ORAL_TABLET | Freq: Every day | ORAL | Status: DC
  Administered 2018-03-09: 20 mg via ORAL
  Filled 2018-03-09: qty 2

## 2018-03-09 MED ORDER — ENOXAPARIN SODIUM 40 MG/0.4ML ~~LOC~~ SOLN: 40.0000 mg | SUBCUTANEOUS | Status: DC

## 2018-03-09 MED ORDER — CLOPIDOGREL BISULFATE 75 MG PO TABS
75.0000 mg | ORAL_TABLET | Freq: Every day | ORAL | Status: DC
  Administered 2018-03-09: 75 mg via ORAL
  Filled 2018-03-09: qty 1

## 2018-03-09 MED ORDER — SERTRALINE HCL 50 MG PO TABS
50.0000 mg | ORAL_TABLET | Freq: Every day | ORAL | Status: DC
  Administered 2018-03-09: 50 mg via ORAL
  Filled 2018-03-09: qty 1

## 2018-03-09 MED ORDER — ACETAMINOPHEN 325 MG PO TABS: 650.0000 mg | ORAL_TABLET | Freq: Four times a day (QID) | ORAL | Status: DC | PRN

## 2018-03-09 NOTE — ED Provider Notes (Signed)
[facility] EMERGENCY DEPARTMENT Provider Note   CSN: [id number] Arrival date & time: 03/09/18  1914     History   Chief Complaint Chief Complaint  Patient presents with  . Chest Pain    HPI LISTER BRIZZI is a 72 y.o. male with a past medical history with a past medical history of parietal lobe infarction, essential hypertension, hyperlipidemia.  He is status post implantable loop recorder back in July 2019.  The patient presents today with chief complaint of left-sided chest pain.  He states he is "an early riser" and usually wakes up around 4 AM.  He woke up this morning at 2 AM with high left-sided chest pain which he describes as throbbing, constant, not worsened with movement, exertion or position.  He denies shortness of breath or nausea.  The pain did not radiate however he did have associated intermittent diaphoresis during this time.  Patient denies any sensation of racing or skipping in his heart.  He denies any new symptoms of weakness, numbness, changes in vision, difficulty with speech or swallowing or other neurologic complaints.  Patient denies cough, fevers, chills, abdominal pain.  HPI  Past Medical History:  Diagnosis Date  . CVA (cerebral vascular accident) (HCC) 10/23/2017   CT confirms subacute R parietal infarct  . GERD (gastroesophageal reflux disease)   . High cholesterol   . Hypertension     Patient Active Problem List   Diagnosis Date Noted  . Ischemic stroke (HCC)   . Hyperlipidemia   . Essential hypertension   . CVA (cerebral vascular accident) (HCC) 10/23/2017    Past Surgical History:  Procedure Laterality Date  . LOOP RECORDER INSERTION N/A 10/27/2017   Procedure: LOOP RECORDER INSERTION;  Surgeon: Marinus Maw, MD;  Location: [facility] INVASIVE CV LAB;  Service: Cardiovascular;  Laterality: N/A;  . NO PAST SURGERIES    . TEE WITHOUT CARDIOVERSION N/A 10/27/2017   Procedure: TRANSESOPHAGEAL ECHOCARDIOGRAM (TEE);  Surgeon: Pricilla Riffle, MD;  Location: [facility] ENDOSCOPY;  Service: Cardiovascular;  Laterality: N/A;        Home Medications    Prior to Admission medications   Medication Sig Start Date End Date Taking? Authorizing Provider  aspirin 81 MG chewable tablet Chew 324 mg by mouth once.   Yes [provider]  clopidogrel (PLAVIX) 75 MG tablet Take 1 tablet (75 mg total) by mouth daily. 10/29/17  Yes [place], Carley D, DO  losartan (COZAAR) 100 MG tablet Take 100 mg by mouth daily.   Yes [provider]  Omega-3 Fatty Acids (FISH OIL PO) Take 1 capsule by mouth daily.    Yes [provider]  rosuvastatin (CRESTOR) 20 MG tablet Take 20 mg by mouth daily.   Yes [provider]  sertraline (ZOLOFT) 50 MG tablet Take 1 tablet (50 mg total) by mouth daily. 10/28/17  Yes Bloomfield, Carley D, DO  tamsulosin (FLOMAX) 0.4 MG CAPS capsule Take 0.4 mg by mouth daily with supper. 10/16/17  Yes [provider]    Family History Family History  Problem Relation Age of Onset  . CVA Mother 40  . Stroke Mother   . Heart attack Father        4  . CVA Father   . Stroke Father     Social History Social History   Tobacco Use  . Smoking status: Former Smoker    Packs/day: 0.12    Years: 10.00    Pack years: 1.20  Types: Cigarettes, Pipe    Last attempt to quit: 1990    Years since quitting: 29.8  . Smokeless tobacco: Never Used  Substance Use Topics  . Alcohol use: Never    Frequency: Never  . Drug use: Never     Allergies   Patient has no known allergies.   Review of Systems Review of Systems  Ten systems reviewed and are negative for acute change, except as noted in the HPI.   Physical Exam Updated Vital Signs BP (!) 160/70   Pulse 63   Temp 98.3 F (36.8 C) (Oral)   Resp 18   Ht 5\' 11"  (1.803 m)   Wt 81.6 kg   SpO2 99%   BMI 25.10 kg/m   Physical Exam  Constitutional: He is oriented to person, place, and time. He appears well-developed and  well-nourished. No distress.  HENT:  Head: Normocephalic and atraumatic.  Eyes: Conjunctivae are normal. No scleral icterus.  Neck: Normal range of motion. Neck supple. No JVD present.  Cardiovascular: Normal rate, regular rhythm, normal heart sounds and intact distal pulses.  Pulmonary/Chest: Effort normal and breath sounds normal. No respiratory distress.  No chest wall tenderness  Abdominal: Soft. There is no tenderness.  Musculoskeletal: He exhibits no edema.       Right lower leg: He exhibits no edema.       Left lower leg: He exhibits no edema.  Neurological: He is alert and oriented to person, place, and time.  Skin: Skin is warm and dry. Capillary refill takes less than 2 seconds. He is not diaphoretic.  Psychiatric: His behavior is normal. His mood appears anxious.  Nursing note and vitals reviewed.    ED Treatments / Results  Labs (all labs ordered are listed, but only abnormal results are displayed) Labs Reviewed  BASIC METABOLIC PANEL - Abnormal; Notable for the following components:      Result Value   Glucose, Bld 121 (*)    Creatinine, Ser 1.39 (*)    GFR calc non Af Amer 49 (*)    GFR calc Af Amer 57 (*)    All other components within normal limits  CBC  I-STAT TROPONIN, ED    EKG EKG Interpretation  Date/Time:  Monday March 09 2018 05:23:23 EST Ventricular Rate:  68 PR Interval:    QRS Duration: 141 QT Interval:  441 QTC Calculation: 469 R Axis:   80 Text Interpretation:  Sinus rhythm Left bundle branch block LBBB is new  Confirmed by Glynn Octaveancour, Stephen [phone]([id number]) on 03/09/2018 5:55:28 AM Also confirmed by Glynn Octaveancour, Stephen [phone]), editor Elita QuickWatlington, Beverly ([id number])  on 03/09/2018 7:01:50 AM Also confirmed by Alvira MondaySchlossman, Erin ([phone])  on 03/09/2018 7:10:52 AM   Radiology Dg Chest 2 View  Result Date: 03/09/2018 CLINICAL DATA:  Left upper chest pain EXAM: CHEST - 2 VIEW COMPARISON:  None. FINDINGS: The heart size and mediastinal contours are within  normal limits. Both lungs are clear. The visualized skeletal structures are unremarkable. IMPRESSION: No active cardiopulmonary disease. Electronically Signed   By: Deatra RobinsonKevin  Herman M.D.   On: 03/09/2018 05:53    Procedures Procedures (including critical care time)  Medications Ordered in ED Medications - No data to display   Initial Impression / Assessment and Plan / ED Course  I have reviewed the triage vital signs and the nursing notes.  Pertinent labs & imaging results that were available during my care of the patient were reviewed by me and considered in my medical decision making (see chart  for details).     Patient with moderate to high risk heart score, his troponin is negative.  He has no significant abnormalities to his basic metabolic panel.  His labs appear to be at baseline.  His CBC is normal.  Patient has had persistent hypertension here in the emergency department.  His EKG does show a new left bundle branch block.  Patient will be admitted by the hospitalist service.  Final Clinical Impressions(s) / ED Diagnoses   Final diagnoses:  Chest pain, unspecified type    ED Discharge Orders    None       Arthor Captain, PA-C 03/10/18 1755    Glynn Octave, MD 03/11/18 1228

## 2018-03-09 NOTE — ED Notes (Addendum)
Report given to 5C RN 

## 2018-03-09 NOTE — ED Notes (Signed)
Pt alert and oriented x4. Skin warm and dry. Respirations equal and unlabored. Denies chest pain, SOB or n/v. Pt ambulatory with steady gait. No acute distress noted.

## 2018-03-09 NOTE — Progress Notes (Deleted)
Cardiology Consultation:   Patient ID: Jordan [name] MRN: [id number]; DOB: 29-Jun-1945  Admit date: 03/09/2018 Date of Consult: 03/09/2018  Primary Care Provider: Sherrie MustacheJadali, Fayegh, MD Primary Cardiologist: No primary care provider on file.  Primary Electrophysiologist:  None    Patient Profile:   Jordan LynchJerry L [name] is a [age] y.o. male with a hx of hypertension, hyperlipidemia, GERD who is being seen today for the evaluation of chest pain at the request of Dr. Mikey BussingHoffman.   History of Present Illness:   Jordan [name] is a 72 year old male with hypertension, hyperlipidemia, prior CVA showing acute versus subacute right parietal infarct, BPH who presented with left-sided chest pain that started 14:00 03/09/2018 when he was watching TV.  The patient describes the chest pain as 5/10 intensity, dull/throbbing in nature, occurring at rest, lasting 2 hours constantly, nonpleuritic and non-positional, without any accompanied nausea/vomiting or dyspnea.  He has not had any chest pain in the past nor has he had angina with exertion (walking or walking up stairs).  He mentions that he takes his Crestor daily but he often times misses taking his losartan.  Initial EKG showed new left bundle branch block there was not any troponin elevation and chest x-ray without was without any acute changes.  Patient does not have any prior cardiac history.  He he does have family history of ACS however with his father passing away from a heart attack when in his 7180s and his brother having to heart attacks recently.  Past Medical History:  Diagnosis Date  . BPH (benign prostatic hyperplasia)   . CVA (cerebral vascular accident) (HCC) 10/23/2017   CT confirms subacute R parietal infarct  . GERD (gastroesophageal reflux disease)   . High cholesterol   . Hypertension     Past Surgical History:  Procedure Laterality Date  . LOOP RECORDER INSERTION N/A 10/27/2017   Procedure: LOOP RECORDER INSERTION;  Surgeon: Marinus Mawaylor, Gregg W, MD;   [facility];  Service: Cardiovascular;  Laterality: N/A;  . NO PAST SURGERIES    . TEE WITHOUT CARDIOVERSION N/A 10/27/2017   Procedure: TRANSESOPHAGEAL ECHOCARDIOGRAM (TEE);  Surgeon: Pricilla Riffleoss, Paula V, MD;  [facility];  Service: Cardiovascular;  Laterality: N/A;     Home Medications:  Prior to Admission medications   Medication Sig Start Date End Date Taking? Authorizing Provider  aspirin 81 MG chewable tablet Chew 324 mg by mouth once.   Yes [provider]  clopidogrel (PLAVIX) 75 MG tablet Take 1 tablet (75 mg total) by mouth daily. 10/29/17  Yes Bloomfield, Carley D, DO  losartan (COZAAR) 100 MG tablet Take 100 mg by mouth daily.   Yes [provider]  Omega-3 Fatty Acids (FISH OIL PO) Take 1 capsule by mouth daily.    Yes [provider]  rosuvastatin (CRESTOR) 20 MG tablet Take 20 mg by mouth daily.   Yes [provider]  sertraline (ZOLOFT) 50 MG tablet Take 1 tablet (50 mg total) by mouth daily. 10/28/17  Yes Bloomfield, Carley D, DO  tamsulosin (FLOMAX) 0.4 MG CAPS capsule Take 0.4 mg by mouth daily with supper. 10/16/17  Yes [provider]    Inpatient Medications: Scheduled Meds: . clopidogrel  75 mg Oral Daily  . enoxaparin (LOVENOX) injection  40 mg Subcutaneous Q24H  . losartan  100 mg Oral Daily  . rosuvastatin  20 mg Oral q1800  . sertraline  50 mg Oral Daily  . tamsulosin  0.4 mg Oral Q supper   Continuous  Infusions:  PRN Meds: acetaminophen **OR** acetaminophen, nitroGLYCERIN, senna-docusate  Allergies:   No Known Allergies  Social History:   Social History   Socioeconomic History  . Marital status: Divorced    Spouse name: Not on file  . Number of children: Not on file  . Years of education: Not on file  . Highest education level: Not on file  Occupational History  . Not on file  Social Needs  . Financial resource strain: Not on file  . Food insecurity:    Worry: Not on file     Inability: Not on file  . Transportation needs:    Medical: Not on file    Non-medical: Not on file  Tobacco Use  . Smoking status: Former Smoker    Packs/day: 0.12    Years: 10.00    Pack years: 1.20    Types: Cigarettes, Pipe    Last attempt to quit: 1990    Years since quitting: 29.8  . Smokeless tobacco: Never Used  Substance and Sexual Activity  . Alcohol use: Never    Frequency: Never  . Drug use: Never  . Sexual activity: Not on file  Lifestyle  . Physical activity:    Days per week: Not on file    Minutes per session: Not on file  . Stress: Not on file  Relationships  . Social connections:    Talks on phone: Not on file    Gets together: Not on file    Attends religious service: Not on file    Active member of club or organization: Not on file    Attends meetings of clubs or organizations: Not on file    Relationship status: Not on file  . Intimate partner violence:    Fear of current or ex partner: Not on file    Emotionally abused: Not on file    Physically abused: Not on file    Forced sexual activity: Not on file  Other Topics Concern  . Not on file  Social History Narrative  . Not on file    Family History:    Family History  Problem Relation Age of Onset  . CVA Mother 62  . Stroke Mother   . Heart attack Father        40  . CVA Father   . Stroke Father      ROS:  Please see the history of present illness.   All other ROS reviewed and negative.     Physical Exam/Data:   Vitals:   03/09/18 0815 03/09/18 0915 03/09/18 0930 03/09/18 1015  BP: (!) 178/91 (!) 168/92 (!) 161/81 (!) 174/77  Pulse: 67 67 66 63  Resp: 14 17 15 16   Temp:    98.2 F (36.8 C)  TempSrc:    Oral  SpO2: 98% 98% 99% 100%  Weight:      Height:       No intake or output data in the 24 hours ending 03/09/18 1348 Filed Weights   03/09/18 0517  Weight: 81.6 kg   Body mass index is 25.1 kg/m.   Physical Exam  Constitutional: Appears well-developed and  well-nourished. No distress.  HENT:  Head: Normocephalic and atraumatic.  Eyes: Conjunctivae are normal.  Cardiovascular: Normal rate, regular rhythm and normal heart sounds.  Respiratory: Effort normal and breath sounds normal. No respiratory distress. No wheezes.  GI: Soft. Bowel sounds are normal. No distension. There is no tenderness.  Musculoskeletal: No edema.  Neurological: Is alert.  Skin:  Not diaphoretic. No erythema.  Psychiatric: Normal mood and affect. Behavior is normal. Judgment and thought content normal.   Relevant CV Studies:   Laboratory Data:  Chemistry Recent Labs  Lab 03/09/18 0523  NA 140  K 4.5  CL 108  CO2 26  GLUCOSE 121*  BUN 9  CREATININE 1.39*  CALCIUM 9.2  GFRNONAA 49*  GFRAA 57*  ANIONGAP 6    No results for input(s): PROT, ALBUMIN, AST, ALT, ALKPHOS, BILITOT in the last 168 hours. Hematology Recent Labs  Lab 03/09/18 0523  WBC 5.7  RBC 4.91  HGB 14.0  HCT 45.4  MCV 92.5  MCH 28.5  MCHC 30.8  RDW 13.1  PLT 231   Cardiac Enzymes Recent Labs  Lab 03/09/18 0933  TROPONINI <0.03    Recent Labs  Lab 03/09/18 0532  TROPIPOC 0.00    BNPNo results for input(s): BNP, PROBNP in the last 168 hours.  DDimer No results for input(s): DDIMER in the last 168 hours.  Radiology/Studies:  Dg Chest 2 View  Result Date: 03/09/2018 CLINICAL DATA:  Left upper chest pain EXAM: CHEST - 2 VIEW COMPARISON:  None. FINDINGS: The heart size and mediastinal contours are within normal limits. Both lungs are clear. The visualized skeletal structures are unremarkable. IMPRESSION: No active cardiopulmonary disease. Electronically Signed   By: Deatra Robinson M.D.   On: 03/09/2018 05:53    Assessment and Plan:   Hypertensive heart disease with LBBB The patient presented with left-sided chest pain at rest that lasted 2 hours without any accompanied dyspnea, diaphoresis or nausea/vomiting.  His EKG showed normal sinus rhythm and left bundle branch block  in leads v2 and v3 which is not significantly worsen from previous. His ekg changes are likely from him not being compliant with his anti-hypertensive medication.   He had a cardiac work-up in July 2019 due to his admission for cryptogenic stroke.  A loop recorder was placed at that time which did not show any significant abnormalities.  The patient's last echo was in July 2019 which showed lvef 55-60%, no regional wall motion abnormalities, g1dd, and lvh.  Will arrange for coronary CT outpatient outpatient and place date on discharge summary. Please provide patient with work restrictions for lifting heavy objects till testing is complete.   -Fasting lipid panel from July 2019 showed total cholesterol = 123, triglycerides = 80, HDL = 32, LDL = 75 -CVD risk score cannot be calculated due to patient's total cholesterol -Continue Crestor 20 mg daily -Continue sublingual nitroglycerin 0.4 mg every 5 minutes as needed  -Counseled patient on importance of adhering with anti-hypertensive regimen  Hypertension  The patient's blood pressure over the past 24 hrs has ranged 160-170s/70-90s.  The patient is being prescribed losartan 100mg  qd in outpatient however the patient states that he has missed several doses of it.  -Continue losartan 100 mg daily  [facility] will sign off.   Medication Recommendations:  Continue losartan 100mg  qd, can start low dose carvedilol  Other recommendations (labs, testing, etc):  Coronary CT  Follow up as an outpatient:  Within 1 week  For questions or updates, please contact [facility] Please consult [email or web address] for contact info under     Signed, Lorenso Courier, MD  03/09/2018 1:48 PM

## 2018-03-09 NOTE — Consult Note (Addendum)
Cardiology Consultation:   Patient ID: Jordan LynchJerry L [name] MRN: [id number]; DOB: 29-Jun-1945  Admit date: 03/09/2018 Date of Consult: 03/09/2018  Primary Care Provider: Sherrie MustacheJadali, Fayegh, MD Primary Cardiologist: No primary care provider on file.  Primary Electrophysiologist:  None    Patient Profile:   Jordan [name] is a [age] y.o. male with a hx of hypertension, hyperlipidemia, GERD who is being seen today for the evaluation of chest pain at the request of Dr. Mikey BussingHoffman.   History of Present Illness:   [name] is a 72 year old male with hypertension, hyperlipidemia, prior CVA showing acute versus subacute right parietal infarct, BPH who presented with left-sided chest pain that started 14:00 03/09/2018 when he was watching TV.  The patient describes the chest pain as 5/10 intensity, dull/throbbing in nature, occurring at rest, lasting 2 hours constantly, nonpleuritic and non-positional, without any accompanied nausea/vomiting or dyspnea.  He has not had any chest pain in the past nor has he had angina with exertion (walking or walking up stairs).  He mentions that he takes his Crestor daily but he often times misses taking his losartan.  Initial EKG showed new left bundle branch block there was not any troponin elevation and chest x-ray without was without any acute changes.  Patient does not have any prior cardiac history.  He he does have family history of ACS however with his father passing away from a heart attack when in his 7272s and his brother having to heart attacks recently.  Past Medical History:  Diagnosis Date  . BPH (benign prostatic hyperplasia)   . CVA (cerebral vascular accident) (HCC) 10/23/2017   CT confirms subacute R parietal infarct  . GERD (gastroesophageal reflux disease)   . High cholesterol   . Hypertension     Past Surgical History:  Procedure Laterality Date  . LOOP RECORDER INSERTION N/A 10/27/2017   Procedure: LOOP RECORDER INSERTION;  Surgeon: Marinus Mawaylor, Gregg W, MD;   [facility];  Service: Cardiovascular;  Laterality: N/A;  . NO PAST SURGERIES    . TEE WITHOUT CARDIOVERSION N/A 10/27/2017   Procedure: TRANSESOPHAGEAL ECHOCARDIOGRAM (TEE);  Surgeon: Pricilla Riffleoss, Paula V, MD;  [facility];  Service: Cardiovascular;  Laterality: N/A;     Home Medications:  Prior to Admission medications   Medication Sig Start Date End Date Taking? Authorizing Provider  aspirin 81 MG chewable tablet Chew 324 mg by mouth once.   Yes [provider]  clopidogrel (PLAVIX) 75 MG tablet Take 1 tablet (75 mg total) by mouth daily. 10/29/17  Yes Bloomfield, Carley D, DO  losartan (COZAAR) 100 MG tablet Take 100 mg by mouth daily.   Yes [provider]  Omega-3 Fatty Acids (FISH OIL PO) Take 1 capsule by mouth daily.    Yes [provider]  rosuvastatin (CRESTOR) 20 MG tablet Take 20 mg by mouth daily.   Yes [provider]  sertraline (ZOLOFT) 50 MG tablet Take 1 tablet (50 mg total) by mouth daily. 10/28/17  Yes Bloomfield, Carley D, DO  tamsulosin (FLOMAX) 0.4 MG CAPS capsule Take 0.4 mg by mouth daily with supper. 10/16/17  Yes [provider]    Inpatient Medications: Scheduled Meds: . clopidogrel  75 mg Oral Daily  . enoxaparin (LOVENOX) injection  40 mg Subcutaneous Q24H  . losartan  100 mg Oral Daily  . rosuvastatin  20 mg Oral q1800  . sertraline  50 mg Oral Daily  . tamsulosin  0.4 mg Oral Q supper   Continuous  Infusions:  PRN Meds: acetaminophen **OR** acetaminophen, nitroGLYCERIN, senna-docusate  Allergies:   No Known Allergies  Social History:   Social History   Socioeconomic History  . Marital status: Divorced    Spouse name: Not on file  . Number of children: Not on file  . Years of education: Not on file  . Highest education level: Not on file  Occupational History  . Not on file  Social Needs  . Financial resource strain: Not on file  . Food insecurity:    Worry: Not on file     Inability: Not on file  . Transportation needs:    Medical: Not on file    Non-medical: Not on file  Tobacco Use  . Smoking status: Former Smoker    Packs/day: 0.12    Years: 10.00    Pack years: 1.20    Types: Cigarettes, Pipe    Last attempt to quit: 1990    Years since quitting: 29.8  . Smokeless tobacco: Never Used  Substance and Sexual Activity  . Alcohol use: Never    Frequency: Never  . Drug use: Never  . Sexual activity: Not on file  Lifestyle  . Physical activity:    Days per week: Not on file    Minutes per session: Not on file  . Stress: Not on file  Relationships  . Social connections:    Talks on phone: Not on file    Gets together: Not on file    Attends religious service: Not on file    Active member of club or organization: Not on file    Attends meetings of clubs or organizations: Not on file    Relationship status: Not on file  . Intimate partner violence:    Fear of current or ex partner: Not on file    Emotionally abused: Not on file    Physically abused: Not on file    Forced sexual activity: Not on file  Other Topics Concern  . Not on file  Social History Narrative  . Not on file    Family History:    Family History  Problem Relation Age of Onset  . CVA Mother 57  . Stroke Mother   . Heart attack Father        21  . CVA Father   . Stroke Father      ROS:  Please see the history of present illness.   All other ROS reviewed and negative.     Physical Exam/Data:   Vitals:   03/09/18 0815 03/09/18 0915 03/09/18 0930 03/09/18 1015  BP: (!) 178/91 (!) 168/92 (!) 161/81 (!) 174/77  Pulse: 67 67 66 63  Resp: 14 17 15 16   Temp:    98.2 F (36.8 C)  TempSrc:    Oral  SpO2: 98% 98% 99% 100%  Weight:      Height:       No intake or output data in the 24 hours ending 03/09/18 1348 Filed Weights   03/09/18 0517  Weight: 81.6 kg   Body mass index is 25.1 kg/m.   Physical Exam  Constitutional: Appears well-developed and  well-nourished. No distress.  HENT:  Head: Normocephalic and atraumatic.  Eyes: Conjunctivae are normal.  Cardiovascular: Normal rate, regular rhythm and normal heart sounds.  Respiratory: Effort normal and breath sounds normal. No respiratory distress. No wheezes.  GI: Soft. Bowel sounds are normal. No distension. There is no tenderness.  Musculoskeletal: No edema.  Neurological: Is alert.  Skin:  Not diaphoretic. No erythema.  Psychiatric: Normal mood and affect. Behavior is normal. Judgment and thought content normal.   Relevant CV Studies:   Laboratory Data:  Chemistry Recent Labs  Lab 03/09/18 0523  NA 140  K 4.5  CL 108  CO2 26  GLUCOSE 121*  BUN 9  CREATININE 1.39*  CALCIUM 9.2  GFRNONAA 49*  GFRAA 57*  ANIONGAP 6    No results for input(s): PROT, ALBUMIN, AST, ALT, ALKPHOS, BILITOT in the last 168 hours. Hematology Recent Labs  Lab 03/09/18 0523  WBC 5.7  RBC 4.91  HGB 14.0  HCT 45.4  MCV 92.5  MCH 28.5  MCHC 30.8  RDW 13.1  PLT 231   Cardiac Enzymes Recent Labs  Lab 03/09/18 0933  TROPONINI <0.03    Recent Labs  Lab 03/09/18 0532  TROPIPOC 0.00    BNPNo results for input(s): BNP, PROBNP in the last 168 hours.  DDimer No results for input(s): DDIMER in the last 168 hours.  Radiology/Studies:  Dg Chest 2 View  Result Date: 03/09/2018 CLINICAL DATA:  Left upper chest pain EXAM: CHEST - 2 VIEW COMPARISON:  None. FINDINGS: The heart size and mediastinal contours are within normal limits. Both lungs are clear. The visualized skeletal structures are unremarkable. IMPRESSION: No active cardiopulmonary disease. Electronically Signed   By: Deatra RobinsonKevin  Herman M.D.   On: 03/09/2018 05:53    Assessment and Plan:   Hypertensive heart disease with LBBB The patient presented with left-sided chest pain at rest that lasted 2 hours without any accompanied dyspnea, diaphoresis or nausea/vomiting.  His EKG showed normal sinus rhythm and left bundle branch block  in leads v2 and v3 which is not significantly worsen from previous. His ekg changes are likely from him not being compliant with his anti-hypertensive medication and is low risk for an acute cardiac event.    He had a cardiac work-up in July 2019 due to his admission for cryptogenic stroke.  A loop recorder was placed at that time which did not show any significant abnormalities.  The patient's last echo was in July 2019 which showed lvef 55-60%, no regional wall motion abnormalities, g1dd, and lvh.  Will arrange for coronary CT outpatient outpatient and place date on discharge summary. Please provide patient with work restrictions for lifting heavy objects till testing is complete.   -Fasting lipid panel from July 2019 showed total cholesterol = 123, triglycerides = 80, HDL = 32, LDL = 75 -CVD risk score cannot be calculated due to patient's total cholesterol -Continue Crestor 20 mg daily -Continue sublingual nitroglycerin 0.4 mg every 5 minutes as needed  -Counseled patient on importance of adhering with anti-hypertensive regimen  Hypertension  The patient's blood pressure over the past 24 hrs has ranged 160-170s/70-90s.  The patient is being prescribed losartan 100mg  qd in outpatient however the patient states that he has missed several doses of it.  -Continue losartan 100 mg daily  [facility] will sign off.   Medication Recommendations:  Continue losartan 100mg  qd, can start low dose carvedilol  Other recommendations (labs, testing, etc):  Coronary CT  Follow up as an outpatient:  Within 1 week  For questions or updates, please contact [facility] Please consult [email or web address] for contact info under     Signed, Lorenso CourierVahini Jannel Lynne, MD  03/09/2018 1:48 PM

## 2018-03-09 NOTE — H&P (Signed)
Date: 03/09/2018               Patient Name:  Jordan [name] MRN: [id number]  DOB: 1946/01/18 Age / Sex: 72 y.o., male   PCP: Sherrie MustacheJadali, Fayegh, MD         Medical Service: Internal Medicine Teaching Service         Attending Physician: Dr. Levert FeinsteinGranfortuna, James M, MD    First Contact: Dr. Cleaster CorinSeawell Pager: [phone]  Second Contact: Dr. Evelene CroonSantos Pager: [phone]       After Hours (After 5p/  First Contact Pager: [phone]  weekends / holidays): Second Contact Pager: [phone]   Chief Complaint: Chest pain  History of Present Illness: Jordan [name] is a 72 year old male with past medical history of  CVA, hypertension and BPH who presents to the emergency department for chest pain that started at 2 AM this morning. Patient states he was watching TV when he noticed a throbbing pain in the left upper chest localized to the second and third rib space.  He states the pain was on and off for an hour and resolved spontaneously. He denied that the pain was worse with movement and nothing made the pain better.  He denies nausea/vomiting, shortness of breath, abdominal pain or leg swelling.  He reports a family history of MI with father at age 72 and brother at age 72.  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  He does not exercise but can climb stairs without developing shortness of breath or chest pains.   Meds:  Current Meds  Medication Sig  . aspirin 81 MG chewable tablet Chew 324 mg by mouth once.  . clopidogrel (PLAVIX) 75 MG tablet Take 1 tablet (75 mg total) by mouth daily.  Marland Kitchen. losartan (COZAAR) 100 MG tablet Take 100 mg by mouth daily.  . Omega-3 Fatty Acids (FISH OIL PO) Take 1 capsule by mouth daily.   . rosuvastatin (CRESTOR) 20 MG tablet Take 20 mg by mouth daily.  . sertraline (ZOLOFT) 50 MG tablet Take 1 tablet (50 mg total) by mouth daily.  . tamsulosin (FLOMAX) 0.4 MG CAPS capsule Take 0.4 mg by mouth daily with supper.     Allergies: Allergies as of 03/09/2018  . (No Known Allergies)    Past Medical History:  Diagnosis Date  . CVA (cerebral vascular accident) (HCC) 10/23/2017   CT confirms subacute R parietal infarct  . GERD (gastroesophageal reflux disease)   . High cholesterol   . Hypertension     Family History:  Family History  Problem Relation Age of Onset  . CVA Mother 8591  . Stroke Mother   . Heart attack Father        5775  . CVA Father   . Stroke Father     Social History: tobacco use: Denies Alcohol use: Denies Illicit drug use: Denies  Review of Systems: A complete ROS was negative except as per HPI.   Physical Exam: Blood pressure (!) 161/80, pulse 66, temperature 98.3 F (36.8 C), temperature source Oral, resp. rate 20, height 5\' 11"  (1.803 m), weight 81.6 kg, SpO2 97 %. Physical Exam  Constitutional: He is oriented to person, place, and time and well-developed, well-nourished, and in no distress. No distress.  HENT:  Head: Normocephalic and atraumatic.  Eyes: Pupils are equal, round, and reactive to light. Conjunctivae and EOM are normal.  Neck: Normal range of motion. Neck supple.  Cardiovascular: Normal rate, regular rhythm and normal heart sounds. Exam reveals  no gallop and no friction rub.  Pulmonary/Chest: Effort normal and breath sounds normal. No respiratory distress. He has no wheezes. He has no rales.  Abdominal: Soft. He exhibits no distension. There is no tenderness.  Musculoskeletal: He exhibits no edema.  Neurological: He is alert and oriented to person, place, and time.  Skin: Skin is warm and dry. He is not diaphoretic.  Psychiatric: Mood, affect and judgment normal.     EKG: personally reviewed my interpretation normal sinus rhythm with new LBBB    Assessment & Plan by Problem: Active Problems:   Chest pain  Jordan [name] is a 72 year old male with past medical history of CVA, hypertension and BPH that presents to the ED with intermittent chest pain that has resolved this morning.  Chest pain Patient presents  with intermittent throbbing chest pain that resolved after an hour.  Initial troponin is negative. New LBBB noted on EKG.  Last echo 10/2017 showed LV EF 55-60% with grade 1 diastolic dysfunction.  No structural abnormalities noted.  Patient has risk factors for CAD including hypertension and diabetes.  Heart score of 5.  Patient has a significant family history of MI with father and brother.  Will consult cardiology for further ischemic evaluation. -aspirin -EKG -trending troponin -Cardiology consult  HTN -Continuing home losartan  Hx of Right Parietal infarction -Continuing home crestor - Cont Plavix  Hx of Diabetes -well controlled on diet   BPH -continue home tamsulosin  Hx Depression -Continue home zoloft  Dispo: Admit patient to Observation with expected length of stay less than 2 midnights.  Signed: Camelia Phenes, DO 03/09/2018, 8:26 AM  Pager: [phone]

## 2018-03-09 NOTE — ED Triage Notes (Signed)
BIB EMS from home, pt reports sudden onset of L sided CP that woke pt up from sleep. Radiated to central chest, dull in nature. Took 324 ASA prior to EMS arrival. Pt is now CP free.

## 2018-03-09 NOTE — Progress Notes (Signed)
Regular diet lunch tray [facility] through [facility] @ 1030.

## 2018-03-09 NOTE — ED Notes (Signed)
Attempted report 

## 2018-03-09 NOTE — ED Notes (Signed)
Nurse drawing labs. 

## 2018-03-09 NOTE — Progress Notes (Signed)
Pt alert and oriented in NAD. Pt verbalized understanding of discharge instructions. 

## 2018-03-09 NOTE — Progress Notes (Signed)
[facility] Summary Report / Loop Recorder 

## 2018-03-11 NOTE — Discharge Summary (Signed)
Name: Jordan [name] MRN: [id number] DOB: 1945-11-14 72 y.o. PCP: Sherrie Mustache, MD  Date of Admission: 03/09/2018  5:13 AM Date of Discharge: 03/09/2018 Attending Physician: Dr. Cephas Darby  Discharge Diagnosis: 1. Atypical Chest Pain  Discharge Medications: Allergies as of 03/09/2018   No Known Allergies     Medication List    TAKE these medications   aspirin 81 MG chewable tablet Chew 324 mg by mouth once.   clopidogrel 75 MG tablet Commonly known as:  PLAVIX Take 1 tablet (75 mg total) by mouth daily.   FISH OIL PO Take 1 capsule by mouth daily.   losartan 100 MG tablet Commonly known as:  COZAAR Take 100 mg by mouth daily.   nitroGLYCERIN 0.4 MG SL tablet Commonly known as:  NITROSTAT Place 1 tablet (0.4 mg total) under the tongue every 5 (five) minutes as needed for chest pain.   rosuvastatin 20 MG tablet Commonly known as:  CRESTOR Take 20 mg by mouth daily.   sertraline 50 MG tablet Commonly known as:  ZOLOFT Take 1 tablet (50 mg total) by mouth daily.   tamsulosin 0.4 MG Caps capsule Commonly known as:  FLOMAX Take 0.4 mg by mouth daily with supper.       Disposition and follow-up:   Mr.Jordan [name] was discharged from [facility] in stable condition.  At the hospital follow up visit please address:  1.  Patient follow-up with cardiology for coronary CT angiogram. 2.  Patient's blood pressure was poorly controlled during admission. May need additional blood pressure medications.  3.  Labs / imaging needed at time of follow-up: none  4.  Pending labs/ test needing follow-up: none  Follow-up Appointments: Follow-up Information    [facility]. Schedule an appointment as soon as possible for a visit in 1 week(s).   Contact information: [address] [place] [address] [phone]          Hospital Course by problem list:  1. Atypical  Chest Pain Patient presented to the ED the morning he had a 1-2 hour episode of chest pain at rest that resolved spontaneously. His troponin was negative 2. EKG showed a new left bundle branch block on admission. Cardiology was consulted and recommended an outpatient ischemic evaluation with a coronary CT angiogram. This was arranged outpatient by cardiology.  2. Poorly controlled hypertension Patient was continued on home losartan. His blood pressure readings were not well controlled during admission. May need to have a thiazide diuretic added on outpatient.  3. History of CVA No neurological deficits during admission. Patient was continued on Plavix and Crestor.  4. History of depression Patient was continued on zoloft.  5. History of BPH Patient was continued on flomax.   Discharge Vitals:   BP 139/79 (BP Location: Right Arm)   Pulse 68   Temp 98.5 F (36.9 C) (Oral)   Resp 18   Ht 5\' 11"  (1.803 m)   Wt 81.6 kg   SpO2 97%   BMI 25.10 kg/m   Pertinent Labs, Studies, and Procedures:  Chest x-ray 11/18: No active cardiopulmonary disease  Discharge Instructions: Discharge Instructions    Call MD for:  difficulty breathing, headache or visual disturbances   Complete by:  As directed    Call MD for:  persistant dizziness or light-headedness   Complete by:  As directed    Call MD for:  persistant nausea and vomiting   Complete by:  As directed    Call MD for:  temperature >100.4   Complete by:  As directed    Diet - low sodium heart healthy   Complete by:  As directed    Discharge instructions   Complete by:  As directed    Thank you for allowing us to care for you  For your chest pain: - Initial work up does not show a cardiac (heart) source for your pain - Cardiology will arrange a imaging study to further evaluate your heart - Please avoid heavy lifting and heavy exertion until this test is performed  A prescription for nitroglycerin has been provided to take in  the case of chest pain as you wait for the testing listed above  Please follow up with your PCP in the next 1-2 weeks and with cardiology as scheduled by them.  If you experience severe symptoms please contact a medical professional   Increase activity slowly   Complete by:  As directed       Signed: Camelia PhenesHoffman, Miciah Covelli Ratliff, DO 03/11/2018, 7:48 AM   Pager: [phone]

## 2018-04-10 ENCOUNTER — Ambulatory Visit (INDEPENDENT_AMBULATORY_CARE_PROVIDER_SITE_OTHER): Payer: Medicare HMO

## 2018-04-10 DIAGNOSIS — I639 Cerebral infarction, unspecified: Secondary | ICD-10-CM

## 2018-04-10 NOTE — Progress Notes (Signed)
[facility] Summary Report / Loop Recorder 

## 2018-04-26 LAB — CUP PACEART REMOTE DEVICE CHECK
MDC IDC PG IMPLANT DT: 20190708
MDC IDC SESS DTM: 20191117184121

## 2018-05-10 LAB — CUP PACEART REMOTE DEVICE CHECK
Implantable Pulse Generator Implant Date: 20190708
MDC IDC SESS DTM: 20191220191022

## 2018-05-13 ENCOUNTER — Ambulatory Visit (INDEPENDENT_AMBULATORY_CARE_PROVIDER_SITE_OTHER): Payer: Medicare HMO

## 2018-05-13 DIAGNOSIS — I639 Cerebral infarction, unspecified: Secondary | ICD-10-CM | POA: Diagnosis not present

## 2018-05-14 LAB — CUP PACEART REMOTE DEVICE CHECK
Date Time Interrogation Session: 20200122194014
MDC IDC PG IMPLANT DT: 20190708

## 2018-05-14 NOTE — Progress Notes (Signed)
[facility] Summary Report / Loop Recorder 

## 2018-06-15 ENCOUNTER — Ambulatory Visit (INDEPENDENT_AMBULATORY_CARE_PROVIDER_SITE_OTHER): Payer: Medicare HMO | Admitting: *Deleted

## 2018-06-15 DIAGNOSIS — I639 Cerebral infarction, unspecified: Secondary | ICD-10-CM

## 2018-06-16 LAB — CUP PACEART REMOTE DEVICE CHECK
Date Time Interrogation Session: 20200224193710
MDC IDC PG IMPLANT DT: 20190708

## 2018-06-19 ENCOUNTER — Telehealth: Payer: Self-pay

## 2018-06-19 ENCOUNTER — Other Ambulatory Visit: Payer: Self-pay

## 2018-06-19 DIAGNOSIS — Z1211 Encounter for screening for malignant neoplasm of colon: Secondary | ICD-10-CM

## 2018-06-19 NOTE — Telephone Encounter (Signed)
Gastroenterology Pre-Procedure Review  Request Date: 07/02/18 Requesting Physician: Dr. Allegra Lai  PATIENT REVIEW QUESTIONS: The patient responded to the following health history questions as indicated:    1. Are you having any GI issues? no 2. Do you have a personal history of Polyps? yes (Unsure of year) 3. Do you have a family history of Colon Cancer or Polyps? no 4. Diabetes Mellitus? yes 5. Joint replacements in the past 12 months?no 6. Major health problems in the past 3 months?no 7. Any artificial heart valves, MVP, or defibrillator?no    MEDICATIONS & ALLERGIES:    Patient reports the following regarding taking any anticoagulation/antiplatelet therapy:   Plavix, Coumadin, Eliquis, Xarelto, Lovenox, Pradaxa, Brilinta, or Effient? yes (Plavix-blood thinner faxed to Dr.Jadali) Aspirin? yes (81 mg)  Patient confirms/reports the following medications:  Current Outpatient Medications  Medication Sig Dispense Refill  . aspirin 81 MG chewable tablet Chew 324 mg by mouth once.    . clopidogrel (PLAVIX) 75 MG tablet Take 1 tablet (75 mg total) by mouth daily. 90 tablet 3  . losartan (COZAAR) 100 MG tablet Take 100 mg by mouth daily.    . nitroGLYCERIN (NITROSTAT) 0.4 MG SL tablet Place 1 tablet (0.4 mg total) under the tongue every 5 (five) minutes as needed for chest pain. 10 tablet 0  . Omega-3 Fatty Acids (FISH OIL PO) Take 1 capsule by mouth daily.     . rosuvastatin (CRESTOR) 20 MG tablet Take 20 mg by mouth daily.    . sertraline (ZOLOFT) 50 MG tablet Take 1 tablet (50 mg total) by mouth daily. 30 tablet 1  . tamsulosin (FLOMAX) 0.4 MG CAPS capsule Take 0.4 mg by mouth daily with supper.     No current facility-administered medications for this visit.     Patient confirms/reports the following allergies:  No Known Allergies  No orders of the defined types were placed in this encounter.   AUTHORIZATION INFORMATION Primary Insurance: 1D#: Group #:  Secondary  Insurance: 1D#: Group #:  SCHEDULE INFORMATION: Date: 07/02/18 Time:TBD Location:[facility]

## 2018-06-23 NOTE — Progress Notes (Signed)
[facility] Summary Report / Loop Recorder 

## 2018-06-25 ENCOUNTER — Telehealth: Payer: Self-pay

## 2018-06-25 NOTE — Telephone Encounter (Signed)
Contacted patient to advise per Dr. Dario Guardian, to stop Plavix 5 days before colonoscopy and resume 1 day after the procedure.  Thanks [employer]

## 2018-07-02 ENCOUNTER — Ambulatory Visit: Admission: RE | Admit: 2018-07-02 | Payer: Medicare HMO | Source: Home / Self Care | Admitting: Gastroenterology

## 2018-07-02 ENCOUNTER — Encounter: Admission: RE | Payer: Self-pay | Source: Home / Self Care

## 2018-07-02 SURGERY — COLONOSCOPY WITH PROPOFOL
Anesthesia: General

## 2018-07-20 ENCOUNTER — Other Ambulatory Visit: Payer: Self-pay

## 2018-07-20 ENCOUNTER — Ambulatory Visit (INDEPENDENT_AMBULATORY_CARE_PROVIDER_SITE_OTHER): Payer: Medicare HMO | Admitting: *Deleted

## 2018-07-20 DIAGNOSIS — I639 Cerebral infarction, unspecified: Secondary | ICD-10-CM

## 2018-07-20 LAB — CUP PACEART REMOTE DEVICE CHECK
Implantable Pulse Generator Implant Date: 20190708
MDC IDC SESS DTM: 20200328203857

## 2018-07-28 NOTE — Progress Notes (Signed)
[facility] Summary Report / Loop Recorder 

## 2018-07-29 ENCOUNTER — Emergency Department (HOSPITAL_COMMUNITY): Payer: Medicare HMO

## 2018-07-29 ENCOUNTER — Emergency Department (HOSPITAL_COMMUNITY)
Admission: EM | Admit: 2018-07-29 | Discharge: 2018-07-29 | Disposition: A | Discharge status: Home / Self Care | Payer: Medicare HMO | Attending: Emergency Medicine | Admitting: Emergency Medicine

## 2018-07-29 ENCOUNTER — Other Ambulatory Visit: Payer: Self-pay

## 2018-07-29 ENCOUNTER — Encounter (HOSPITAL_COMMUNITY): Payer: Self-pay | Admitting: Emergency Medicine

## 2018-07-29 DIAGNOSIS — Z7982 Long term (current) use of aspirin: Secondary | ICD-10-CM | POA: Insufficient documentation

## 2018-07-29 DIAGNOSIS — Z87891 Personal history of nicotine dependence: Secondary | ICD-10-CM | POA: Insufficient documentation

## 2018-07-29 DIAGNOSIS — Z79899 Other long term (current) drug therapy: Secondary | ICD-10-CM | POA: Diagnosis not present

## 2018-07-29 DIAGNOSIS — I10 Essential (primary) hypertension: Secondary | ICD-10-CM | POA: Insufficient documentation

## 2018-07-29 DIAGNOSIS — Z7902 Long term (current) use of antithrombotics/antiplatelets: Secondary | ICD-10-CM | POA: Insufficient documentation

## 2018-07-29 DIAGNOSIS — E785 Hyperlipidemia, unspecified: Secondary | ICD-10-CM | POA: Diagnosis not present

## 2018-07-29 DIAGNOSIS — E7800 Pure hypercholesterolemia, unspecified: Secondary | ICD-10-CM | POA: Insufficient documentation

## 2018-07-29 DIAGNOSIS — R4182 Altered mental status, unspecified: Secondary | ICD-10-CM | POA: Diagnosis present

## 2018-07-29 DIAGNOSIS — R410 Disorientation, unspecified: Secondary | ICD-10-CM | POA: Diagnosis not present

## 2018-07-29 DIAGNOSIS — Z8673 Personal history of transient ischemic attack (TIA), and cerebral infarction without residual deficits: Secondary | ICD-10-CM | POA: Insufficient documentation

## 2018-07-29 DIAGNOSIS — R030 Elevated blood-pressure reading, without diagnosis of hypertension: Secondary | ICD-10-CM

## 2018-07-29 LAB — CBC WITH DIFFERENTIAL/PLATELET
Abs Immature Granulocytes: 0.02 10*3/uL (ref 0.00–0.07)
Basophils Absolute: 0 10*3/uL (ref 0.0–0.1)
Basophils Relative: 0 %
Eosinophils Absolute: 0 10*3/uL (ref 0.0–0.5)
Eosinophils Relative: 0 %
HCT: 46 % (ref 39.0–52.0)
Hemoglobin: 14.5 g/dL (ref 13.0–17.0)
Immature Granulocytes: 0 %
Lymphocytes Relative: 22 %
Lymphs Abs: 2 10*3/uL (ref 0.7–4.0)
MCH: 28.8 pg (ref 26.0–34.0)
MCHC: 31.5 g/dL (ref 30.0–36.0)
MCV: 91.5 fL (ref 80.0–100.0)
Monocytes Absolute: 0.8 10*3/uL (ref 0.1–1.0)
Monocytes Relative: 9 %
Neutro Abs: 6.4 10*3/uL (ref 1.7–7.7)
Neutrophils Relative %: 69 %
Platelets: 261 10*3/uL (ref 150–400)
RBC: 5.03 MIL/uL (ref 4.22–5.81)
RDW: 13.3 % (ref 11.5–15.5)
WBC: 9.4 10*3/uL (ref 4.0–10.5)
nRBC: 0 % (ref 0.0–0.2)

## 2018-07-29 LAB — COMPREHENSIVE METABOLIC PANEL
ALT: 16 U/L (ref 0–44)
AST: 17 U/L (ref 15–41)
Albumin: 3.3 g/dL — ABNORMAL LOW (ref 3.5–5.0)
Alkaline Phosphatase: 58 U/L (ref 38–126)
Anion gap: 11 (ref 5–15)
BUN: 10 mg/dL (ref 8–23)
CO2: 24 mmol/L (ref 22–32)
Calcium: 9.5 mg/dL (ref 8.9–10.3)
Chloride: 103 mmol/L (ref 98–111)
Creatinine, Ser: 1.41 mg/dL — ABNORMAL HIGH (ref 0.61–1.24)
GFR calc Af Amer: 57 mL/min — ABNORMAL LOW (ref >60)
GFR calc non Af Amer: 49 mL/min — ABNORMAL LOW (ref >60)
Glucose, Bld: 122 mg/dL — ABNORMAL HIGH (ref 70–99)
Potassium: 3.7 mmol/L (ref 3.5–5.1)
Sodium: 138 mmol/L (ref 135–145)
Total Bilirubin: 0.5 mg/dL (ref 0.3–1.2)
Total Protein: 7 g/dL (ref 6.5–8.1)

## 2018-07-29 LAB — URINALYSIS, ROUTINE W REFLEX MICROSCOPIC
Bacteria, UA: NONE SEEN
Bilirubin Urine: NEGATIVE
Glucose, UA: NEGATIVE mg/dL
Hgb urine dipstick: NEGATIVE
Ketones, ur: NEGATIVE mg/dL
Nitrite: NEGATIVE
Protein, ur: NEGATIVE mg/dL
Specific Gravity, Urine: 1.023 (ref 1.005–1.030)
pH: 5 (ref 5.0–8.0)

## 2018-07-29 LAB — PROTIME-INR
INR: 1.1 (ref 0.8–1.2)
Prothrombin Time: 13.7 seconds (ref 11.4–15.2)

## 2018-07-29 LAB — APTT: aPTT: 32 seconds (ref 24–36)

## 2018-07-29 LAB — CBG MONITORING, ED: Glucose-Capillary: 96 mg/dL (ref 70–99)

## 2018-07-29 LAB — RAPID URINE DRUG SCREEN, HOSP PERFORMED
Amphetamines: NOT DETECTED
Barbiturates: NOT DETECTED
Benzodiazepines: NOT DETECTED
Cocaine: NOT DETECTED
Opiates: NOT DETECTED
Tetrahydrocannabinol: NOT DETECTED

## 2018-07-29 LAB — ETHANOL: Alcohol, Ethyl (B): <10 mg/dL (ref <10)

## 2018-07-29 LAB — TROPONIN I: Troponin I: <0.03 ng/mL (ref <0.03)

## 2018-07-29 MED ORDER — LOSARTAN POTASSIUM 50 MG PO TABS
100.0000 mg | ORAL_TABLET | Freq: Every day | ORAL | Status: DC
  Filled 2018-07-29: qty 2

## 2018-07-29 MED ORDER — LOSARTAN POTASSIUM 50 MG PO TABS
50.0000 mg | ORAL_TABLET | Freq: Once | ORAL | Status: AC
  Administered 2018-07-29: 50 mg via ORAL
  Filled 2018-07-29: qty 1

## 2018-07-29 NOTE — ED Notes (Signed)
Patient returned from MRI.

## 2018-07-29 NOTE — ED Notes (Signed)
Patient transported to MRI 

## 2018-07-29 NOTE — ED Notes (Signed)
Pacemaker interrogated. 

## 2018-07-29 NOTE — ED Provider Notes (Addendum)
[facility] EMERGENCY DEPARTMENT Provider Note   CSN: [id number] Arrival date & time: 07/29/18  0758    History   Chief Complaint Chief Complaint  Patient presents with   Altered Mental Status   Emesis    HPI Jordan [name] is a 73 y.o. male with history of ischemic stroke, hyperlipidemia, hypertension currently on Plavix presenting today for transient altered mental status.  Patient reports that he went to bed at 9 PM last night feeling well however he woke up at 1 AM this morning feeling disoriented.  Patient returned to bed and woke up at 4 AM to prepare for work.  At that time patient felt disoriented and was unable to start his vehicle.  Patient then called his sister who instructed him to call 911.  Patient could not figure out how to call 911 at which time he then went to his neighbor's house and EMS was called.  Per nursing staff EMS noted some slow speech on their arrival with no other neuro deficits.  Patient reports that in July 2019 he had an ischemic stroke which only presented with disorientation he denies any persistent deficit from his previous stroke.  Patient denies any numbness weakness or tingling today.  He denies any visual changes or disorientation at time of initial evaluation.    HPI  Past Medical History:  Diagnosis Date   BPH (benign prostatic hyperplasia)    CVA (cerebral vascular accident) (HCC) 10/23/2017   CT confirms subacute R parietal infarct   GERD (gastroesophageal reflux disease)    High cholesterol    Hypertension     Patient Active Problem List   Diagnosis Date Noted   Chest pain 03/09/2018   HTN (hypertension), benign    History of ischemic left MCA stroke    Ischemic stroke (HCC)    Hyperlipidemia    Essential hypertension    CVA (cerebral vascular accident) (HCC) 10/23/2017    Past Surgical History:  Procedure Laterality Date   LOOP RECORDER INSERTION N/A 10/27/2017   Procedure: LOOP RECORDER  INSERTION;  Surgeon: Marinus Maw, MD;  Location: [facility] INVASIVE CV LAB;  Service: Cardiovascular;  Laterality: N/A;   NO PAST SURGERIES     TEE WITHOUT CARDIOVERSION N/A 10/27/2017   Procedure: TRANSESOPHAGEAL ECHOCARDIOGRAM (TEE);  Surgeon: Pricilla Riffle, MD;  Location: [facility] ENDOSCOPY;  Service: Cardiovascular;  Laterality: N/A;        Home Medications    Prior to Admission medications   Medication Sig Start Date End Date Taking? Authorizing Provider  clopidogrel (PLAVIX) 75 MG tablet Take 1 tablet (75 mg total) by mouth daily. 10/29/17  Yes Bloomfield, Carley D, DO  aspirin 81 MG chewable tablet Chew 324 mg by mouth once.    [provider]  losartan (COZAAR) 100 MG tablet Take 100 mg by mouth daily.    [provider]  nitroGLYCERIN (NITROSTAT) 0.4 MG SL tablet Place 1 tablet (0.4 mg total) under the tongue every 5 (five) minutes as needed for chest pain. 03/09/18   Beola Cord, MD  Omega-3 Fatty Acids (FISH OIL PO) Take 1 capsule by mouth daily.     [provider]  rosuvastatin (CRESTOR) 20 MG tablet Take 20 mg by mouth daily.    [provider]  sertraline (ZOLOFT) 50 MG tablet Take 1 tablet (50 mg total) by mouth daily. 10/28/17   Bloomfield, Carley D, DO  tamsulosin (FLOMAX) 0.4 MG CAPS capsule Take 0.4 mg by mouth daily with supper.  10/16/17   [provider]    Family History Family History  Problem Relation Age of Onset   CVA Mother 5491   Stroke Mother    Heart attack Father        975   CVA Father    Stroke Father     Social History Social History   Tobacco Use   Smoking status: Former Smoker    Packs/day: 0.12    Years: 10.00    Pack years: 1.20    Types: Cigarettes, Pipe    Last attempt to quit: 1990    Years since quitting: 30.2   Smokeless tobacco: Never Used  Substance Use Topics   Alcohol use: Never    Frequency: Never   Drug use: Never     Allergies   Patient has no known  allergies.   Review of Systems Review of Systems  Constitutional: Negative.  Negative for chills and fever.  Eyes: Negative.  Negative for visual disturbance.  Respiratory: Negative.  Negative for cough and shortness of breath.   Cardiovascular: Negative.  Negative for chest pain.  Gastrointestinal: Negative.  Negative for abdominal pain, diarrhea, nausea and vomiting.  Genitourinary: Negative.  Negative for dysuria and hematuria.  Musculoskeletal: Negative.  Negative for arthralgias and myalgias.  Neurological: Negative for dizziness, syncope, weakness, numbness and headaches.       + Disorientation  All other systems reviewed and are negative.  Physical Exam Updated Vital Signs BP (!) 166/97    Pulse 78    Temp 99.7 F (37.6 C) (Oral)    Resp 15    Ht 5\' 11"  (1.803 m)    Wt 74.8 kg    SpO2 99%    BMI 23.01 kg/m   Physical Exam Constitutional:      General: He is not in acute distress.    Appearance: Normal appearance. He is well-developed. He is not ill-appearing or diaphoretic.  HENT:     Head: Normocephalic and atraumatic.     Right Ear: External ear normal.     Left Ear: External ear normal.     Nose: Nose normal.     Mouth/Throat:     Mouth: Mucous membranes are moist.     Pharynx: Oropharynx is clear.  Eyes:     General: Vision grossly intact. Gaze aligned appropriately.     Pupils: Pupils are equal, round, and reactive to light.  Neck:     Musculoskeletal: Normal range of motion.     Trachea: Trachea and phonation normal. No tracheal deviation.  Cardiovascular:     Rate and Rhythm: Normal rate and regular rhythm.     Pulses: Normal pulses.     Heart sounds: Normal heart sounds.  Pulmonary:     Effort: Pulmonary effort is normal. No respiratory distress.     Breath sounds: Normal breath sounds.  Abdominal:     General: There is no distension.     Palpations: Abdomen is soft.     Tenderness: There is no abdominal tenderness. There is no guarding or rebound.   Musculoskeletal: Normal range of motion.  Skin:    General: Skin is warm and dry.  Neurological:     General: No focal deficit present.     Mental Status: He is alert and oriented to person, place, and time.     GCS: GCS eye subscore is 4. GCS verbal subscore is 5. GCS motor subscore is 6.     Comments: Mental Status: Alert, oriented, thought content  appropriate, able to give a coherent history. Speech fluent without evidence of aphasia. Able to follow 2 step commands without difficulty. Cranial Nerves: II: Peripheral visual fields grossly normal, pupils equal, round, reactive to light III,IV, VI: ptosis not present, extra-ocular motions intact bilaterally V,VII: smile symmetric, eyebrows raise symmetric, facial light touch sensation equal VIII: hearing grossly normal to voice X: uvula elevates symmetrically XI: bilateral shoulder shrug symmetric and strong XII: midline tongue extension without fassiculations Motor: Normal tone. 5/5 strength in upper and lower extremities bilaterally including strong and equal grip strength and dorsiflexion/plantar flexion Sensory: Sensation intact to light touch in all extremities. Deep Tendon Reflexes: 2+ and symmetric patella Cerebellar: normal finger-to-nose with bilateral upper extremities. Normal heel-to -shin balance bilaterally of the lower extremity. No pronator drift.  CV: distal pulses palpable throughout Normal gait  Psychiatric:        Mood and Affect: Mood normal.        Behavior: Behavior normal.      ED Treatments / Results  Labs (all labs ordered are listed, but only abnormal results are displayed) Labs Reviewed  COMPREHENSIVE METABOLIC PANEL - Abnormal; Notable for the following components:      Result Value   Glucose, Bld 122 (*)    Creatinine, Ser 1.41 (*)    Albumin 3.3 (*)    GFR calc non Af Amer 49 (*)    GFR calc Af Amer 57 (*)    All other components within normal limits  URINALYSIS, ROUTINE W REFLEX  MICROSCOPIC - Abnormal; Notable for the following components:   APPearance HAZY (*)    Leukocytes,Ua SMALL (*)    All other components within normal limits  CBC WITH DIFFERENTIAL/PLATELET  RAPID URINE DRUG SCREEN, HOSP PERFORMED  APTT  PROTIME-INR  ETHANOL  TROPONIN I  CBG MONITORING, ED    EKG EKG Interpretation  Date/Time:  Wednesday July 29 2018 08:05:29 EDT Ventricular Rate:  82 PR Interval:    QRS Duration: 120 QT Interval:  389 QTC Calculation: 455 R Axis:   -29 Text Interpretation:  Sinus rhythm Incomplete left bundle branch block Probable left ventricular hypertrophy Anterior ST elevation, probably due to LVH Confirmed by Kennis Carina [phone]) on 07/29/2018 8:09:03 AM Also confirmed by Kennis Carina [phone]), editor Barbette Hair [phone])  on 07/29/2018 8:57:04 AM   Radiology Ct Head Wo Contrast  Result Date: 07/29/2018 CLINICAL DATA:  Altered level of consciousness EXAM: CT HEAD WITHOUT CONTRAST TECHNIQUE: Contiguous axial images were obtained from the base of the skull through the vertex without intravenous contrast. COMPARISON:  10/23/2017 FINDINGS: Brain: Old infarcts in the posterior right parietal lobe and left occipital lobe. Old infarct in the anterior medial right frontal lobe. Old left basal ganglia lacunar infarct. There is atrophy and chronic small vessel disease changes. No acute intracranial abnormality. Specifically, no hemorrhage, hydrocephalus, mass lesion, acute infarction, or significant intracranial injury. Vascular: No hyperdense vessel or unexpected calcification. Skull: No acute calvarial abnormality. Sinuses/Orbits: Visualized paranasal sinuses and mastoids clear. Orbital soft tissues unremarkable. Other: None IMPRESSION: Areas of multifocal old infarcts. Atrophy, chronic microvascular disease. No acute intracranial abnormality. Electronically Signed   By: Charlett Nose M.D.   On: 07/29/2018 08:43   Mr Brain Wo Contrast  Result Date: 07/29/2018 CLINICAL DATA:   Confusion. EXAM: MRI HEAD WITHOUT CONTRAST TECHNIQUE: Multiplanar, multiecho pulse sequences of the brain and surrounding structures were obtained without intravenous contrast. COMPARISON:  Head CT 07/29/2018 and MRI 10/23/2017 FINDINGS: The study is mildly motion degraded. Axial T1  and coronal T2 sequences were not obtained. Brain: There is no evidence of acute infarct, mass, midline shift, or extra-axial fluid collection. Innumerable chronic microhemorrhages are again seen throughout both cerebral hemispheres both peripherally and centrally as well as throughout the cerebellum, nonspecific but potentially reflecting chronic hypertension although other etiologies including cerebral amyloid angiopathy are also possible. Chronic infarcts are again seen in the right parietal lobe, medial right frontal lobe, left occipital lobe, left thalamus, and left basal ganglia. There is wallerian degeneration anteriorly in the corpus callosum. Additional patchy to confluent T2 hyperintensity elsewhere in the cerebral white matter bilaterally is similar to the prior MRI and nonspecific but compatible with moderate chronic small vessel ischemic disease. Cerebral atrophy is mild for age. Vascular: Major intracranial vascular flow voids are preserved. Skull and upper cervical spine: Unremarkable bone marrow signal. Sinuses/Orbits: Unremarkable orbits. Paranasal sinuses and mastoid air cells are clear. Other: None. IMPRESSION: 1. No acute intracranial abnormality. 2. 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 Electronically Signed   By: Sebastian Ache M.D.   On: 07/29/2018 14:19    Procedures Procedures (including critical care time)  Medications Ordered in ED Medications  losartan (COZAAR) tablet 50 mg (50 mg Oral Given 07/29/18 1502)     Initial Impression / Assessment and Plan / ED Course  I have reviewed the triage vital signs and the  nursing notes.  Pertinent labs & imaging results that were available during my care of the patient were reviewed by me and considered in my medical decision making (see chart for details).  Clinical Course as of Jul 28 1553  Wed Jul 29, 2018  0818 After Non-Con CT Head; consult Neuro.   [BM]  [phone] Discussed with neuro, MRI ordered. Neuro to see patient.   [BM]  1029 Discussed case with on-call neurology physician assistant, pending MRI without acute findings advises discharge and patient to follow-up outpatient with their clinic. Will need to interrogate loop recorder prior to MRI.   [BM]  1449 In-person discussion with neurologist Dr. Laurence Slate, recommends patient continue his Plavix and aspirin, close outpatient neurology follow-up.  He requests that patient's home blood pressure medications be given to him and his hypertension improved to under 170/100 prior to discharge.   [BM]    Clinical Course User Index [BM] Bill Salinas, PA-C   73 year old male presenting with resolved disorientation.  Last seen normal at 9 PM last night prior to bed.  No neuro deficits on examination.  No visual disturbances, a aphasia or neglect.  Patient states that he is feeling well at this time and is with no complaints.  Suspect TIA at this time.  AMS/TIA work-up begun. Patient seen and evaluated by Dr. Pilar Plate. ---------- CBC within normal limits CMP with elevated creatinine appears patient's baseline Ethanol level negative APTT within normal limits PT/INR within normal limits CBG 96 Urinalysis unremarkable Troponin negative UDS negative EKG without acute findings reviewed with Dr. Pilar Plate  CT Head:  IMPRESSION:  Areas of multifocal old infarcts.    Atrophy, chronic microvascular disease.    No acute intracranial abnormality.   ------------------ MRI Brain:  IMPRESSION:  1. No acute intracranial abnormality.  2. Multiple chronic infarcts, chronic small vessel ischemic white  matter disease,  and innumerable chronic microhemorrhages as above.  ================================== Patient was seen and evaluated by neurology during this visit please see their note.  Per neurology plan as patient's MRIs without acute finding he is to  be discharged at this time with outpatient neurology follow-up.  Additionally loop recorder was interrogated and per nursing communication is without abnormality.  Patient has been updated on all results today, plan of care and he is agreeable to discharge at this time.  The patient was noted to have elevated BP in ED today. Hx of HTN and has not taken daily medications today. I have spoken with the patient regarding elevated blood pressure readings and the need for improved management.  He is asymptomatic regarding his hypertension today.  Instructed the patient to followup with their PCP within 1 week for BP check. I also counseled the patient regarding the signs and symptoms which would require an emergent visit to an emergency department for hypertensive urgency and/or hypertensive emergency.  At this time there does not appear to be any evidence of an acute emergency medical condition and the patient appears stable for discharge with appropriate outpatient follow up. Diagnosis was discussed with patient who verbalizes understanding of care plan and is agreeable to discharge. I have discussed return precautions with patient who verbalizes understanding of return precautions. Patient encouraged to follow-up with their PCP and neurology. All questions answered.  Patient has been discharged in good condition.  Patient's case rediscussed with Dr. Pilar Plate who agrees with plan to discharge with follow-up.   Note: Portions of this report may have been transcribed using voice recognition software. Every effort was made to ensure accuracy; however, inadvertent computerized transcription errors may still be present. Final Clinical Impressions(s) / ED Diagnoses   Final  diagnoses:  Transient confusion  Elevated blood pressure reading    ED Discharge Orders    None       Bill Salinas, PA-C 07/29/18 1555    Bill Salinas, PA-C 07/29/18 1600    Sabas Sous, MD 07/30/18 [phone]

## 2018-07-29 NOTE — ED Notes (Signed)
This RN spoke with [employer] rep. Stated the nothing abnormal was reported from pacemaker interrogation. Provider made aware.

## 2018-07-29 NOTE — ED Triage Notes (Signed)
Per [facility] pt coming from home reports waking up about 1 am feeling disorientated and had an episode of vomiting. Patient then woke back up at 4 am to get ready for work when he could not figure out how to start the car. Patient called his sister that instructed him to call 911. When patient could not figure how to do that he went to his neighbors. When EMS arrived patient alert and orientated x4. Patient was sluggish to respond, reports stroke in July that had similar symptoms but no deficits.

## 2018-07-29 NOTE — ED Notes (Signed)
Patient transported to CT 

## 2018-07-29 NOTE — Consult Note (Addendum)
Neurology Consultation  Reason for Consult: Transient confusion Referring Physician: Pilar PlateBero  History is obtained from:  HPI: Jordan [name] is a 73 y.o. 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  Patient come to the hospital today secondary to waking up with confusion that is fully resolved at this time.  He states he went to sleep last night at 2100 hrs. and woke up at 4:00 in the morning.  He does admit that he was not sleeping very well.  He states upon waking up he felt confused, had difficult time getting ready stating he did not know what he was doing.  He tried to use his phone but was having difficulty recalling how to use it.  He eventually was able to call his sister who stated he should call 911.  He then went to his neighbor's house who called 911.  By the time EMS had arrived he felt as though his confusion was subsiding.  At this time he does not have any confusion  He denies taking any medications prior to going to bed.  He states he did have a headache but he does not usually have headaches.  He notes he does have high blood pressure but does not know what it commonly is.  Upon entering the ED his blood pressure was 150s systolically.  At this time he is back to baseline   ED course: CT head, CMP, drug screen, urinalysis, ethanol, CBC.   Chart review: Patient was seen back on 10/2017 for a right parietal infarct in the setting of old embolic infarcts.  Symptoms at that time or confusion such as having difficulty dressing himself in the morning and difficulty driving.  MRI at that time did show a right parietal infarct along with old right paramedial frontal infarcts along with basal ganglia lacunar wounds and numerous microhemorrhages.  MRA showed no L VO.  Echocardiogram showed left ventricular EF of 55% - 60%.  TEE showed no evidence of thrombus in the atrial cavity  or appendage.  Loop recorder was implanted at that time.  LDL was 75.  A1c was 6.4.  There was consideration of patient being placed on the [employer] trial-however he did not enroll   LKW: 2100 hrs. on 07/28/2018 tpa given?: no, symptoms resolved Premorbid modified Rankin scale (mRS): 0 NIH stroke score 0   ROS: A 14 point ROS was performed and is negative except as noted in the HPI.  Past Medical History:  Diagnosis Date  . BPH (benign prostatic hyperplasia)   . CVA (cerebral vascular accident) (HCC) 10/23/2017   CT confirms subacute R parietal infarct  . GERD (gastroesophageal reflux disease)   . High cholesterol   . Hypertension     Family History  Problem Relation Age of Onset  . CVA Mother 4091  . Stroke Mother   . Heart attack Father        [id number]  . CVA Father   . Stroke Father     Social History:   reports that he quit smoking about 30 years ago. His smoking use included cigarettes and pipe. He has a 1.20 pack-year smoking history. He has never used smokeless tobacco. He reports that he does not drink alcohol or use drugs.  Medications No current facility-administered medications for this encounter.   Current Outpatient Medications:  .  aspirin 81 MG chewable tablet, Chew 324 mg by mouth once., Disp: ,  Rfl:  .  clopidogrel (PLAVIX) 75 MG tablet, Take 1 tablet (75 mg total) by mouth daily., Disp: 90 tablet, Rfl: 3 .  losartan (COZAAR) 100 MG tablet, Take 100 mg by mouth daily., Disp: , Rfl:  .  nitroGLYCERIN (NITROSTAT) 0.4 MG SL tablet, Place 1 tablet (0.4 mg total) under the tongue every 5 (five) minutes as needed for chest pain., Disp: 10 tablet, Rfl: 0 .  Omega-3 Fatty Acids (FISH OIL PO), Take 1 capsule by mouth daily. , Disp: , Rfl:  .  rosuvastatin (CRESTOR) 20 MG tablet, Take 20 mg by mouth daily., Disp: , Rfl:  .  sertraline (ZOLOFT) 50 MG tablet, Take 1 tablet (50 mg total) by mouth daily., Disp: 30 tablet, Rfl: 1 .  tamsulosin (FLOMAX) 0.4 MG CAPS capsule, Take 0.4  mg by mouth daily with supper., Disp: , Rfl:    Exam: Current vital signs: BP (!) 153/86   Pulse 81   Temp 99.7 F (37.6 C) (Oral)   Resp 20   Ht 5\' 11"  (1.803 m)   Wt 74.8 kg   SpO2 99%   BMI 23.01 kg/m  Vital signs in last 24 hours: Temp:  [99.7 F (37.6 C)] 99.7 F (37.6 C) (04/08 0802) Pulse Rate:  [81-83] 81 (04/08 0900) Resp:  [19-20] 20 (04/08 0900) BP: (153-158)/(86-91) 153/86 (04/08 0900) SpO2:  [99 %-100 %] 99 % (04/08 0900) Weight:  [74.8 kg] 74.8 kg (04/08 0804)  Physical Exam  Constitutional: Appears well-developed and well-nourished.  Psych: Affect appropriate to situation Eyes: No scleral injection HENT: No OP obstrucion Head: Normocephalic.  Cardiovascular: Normal rate and regular rhythm.  Respiratory: Effort normal, non-labored breathing GI: Soft.  No distension. There is no tenderness.  Skin: WDI  Neuro: Mental Status: Patient is awake, alert, oriented to person, place, month, year, and situation. Patient is able to give a clear and coherent history. No signs of aphasia or neglect Cranial Nerves: II: Visual Fields are full. .   III,IV, VI: EOMI without ptosis or diploplia. Pupils are equal, round, and reactive to light V: Facial sensation is symmetric to temperature VII: Facial movement is symmetric.  VIII: hearing is intact to voice X: Uvula elevates symmetrically XI: Shoulder shrug is symmetric. XII: tongue is midline without atrophy or fasciculations.  Motor: Tone is normal. Bulk is normal. 5/5 strength was present in all four extremities.  Sensory: Sensation is symmetric to light touch and temperature in the arms and legs. Deep Tendon Reflexes: 2+ and symmetric in the biceps no significant knee jerk or Achilles reflexes Plantars: Toes are downgoing bilaterally.  Cerebellar: FNF and HKS are intact bilaterally  Labs I have reviewed labs in epic and the results pertinent to this consultation are:   CBC    Component Value Date/Time    WBC 9.4 07/29/2018 0808   RBC 5.03 07/29/2018 0808   HGB 14.5 07/29/2018 0808   HCT 46.0 07/29/2018 0808   PLT 261 07/29/2018 0808   MCV 91.5 07/29/2018 0808   MCH 28.8 07/29/2018 0808   MCHC 31.5 07/29/2018 0808   RDW 13.3 07/29/2018 0808   LYMPHSABS 2.0 07/29/2018 0808   MONOABS 0.8 07/29/2018 0808   EOSABS 0.0 07/29/2018 0808   BASOSABS 0.0 07/29/2018 0808    CMP     Component Value Date/Time   NA 138 07/29/2018 0808   K 3.7 07/29/2018 0808   CL 103 07/29/2018 0808   CO2 24 07/29/2018 0808   GLUCOSE 122 (H) 07/29/2018 5436  BUN 10 07/29/2018 0808   CREATININE 1.41 (H) 07/29/2018 0808   CALCIUM 9.5 07/29/2018 0808   PROT 7.0 07/29/2018 0808   ALBUMIN 3.3 (L) 07/29/2018 0808   AST 17 07/29/2018 0808   ALT 16 07/29/2018 0808   ALKPHOS 58 07/29/2018 0808   BILITOT 0.5 07/29/2018 0808   GFRNONAA 49 (L) 07/29/2018 0808   GFRAA 57 (L) 07/29/2018 0808    Lipid Panel     Component Value Date/Time   CHOL 123 10/24/2017 0357   TRIG 80 10/24/2017 0357   HDL 32 (L) 10/24/2017 0357   CHOLHDL 3.8 10/24/2017 0357   VLDL 16 10/24/2017 0357   LDLCALC 75 10/24/2017 0357     Imaging I have reviewed the images obtained:  CT-scan of the brain--no acute hemorrhage, infarct.  Areas of focal old infarcts, atrophy, chronic microvascular disease.  MRI examination of the brain-pending  Felicie Morn PA-C [facility] [phone]  M-F  (9:00 am- 5:00 PM)  07/29/2018, 9:45 AM     Assessment: Transient period of confusion which is more characteristic of apraxia.  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  Impression: - Transient episode of confusion vs apraxia.   Recommendations: -Interrogate loop recorder - MRI of brain to evaluate for further infarcts  NEUROHOSPITALIST ADDENDUM Performed a face to face diagnostic evaluation.   I have reviewed  the contents of history and physical exam as documented by PA/ARNP/Resident and agree with above documentation.  I have discussed and formulated the above plan as documented. Edits to the note have been made as needed.  Patient with previous history of cryptogenic stroke, right MCA infarcts presents to the emergency department after waking up feeling " disoriented".  Denies any problem with language, however states he has difficulty operating his TV remote, difficulty starting the car.  Last known normal was last night and patient noticed the symptoms when he woke up at 4:00 this morning. In ED we repeated a head CT and obtain MRI brain both of which were negative for acute stroke.  Patient is on a loop monitor which was interrogated and showed no evidence of atrial fibrillation.  He recently had a work-up last year which included a CTA head and neck which did not show any significant stenosis and echocardiogram normal EF, no PFO.  This event may have been a TIA versus recrudescence of his old stroke symptoms.  Unlikely to have been a seizure as patient remembers the entire event.  His blood pressure was slightly elevated, so less likely hypertensive emergency.  I do not think patient needs admission for TIA work-up given that he has been evaluated recently.  He is already on aspirin and Plavix and so recommend continuing that. Linq device was interrogated-apparently showed no atrial fibrillation per EDP.  She can be discharged and close follow-up with patient's neurologist recommended.(Dr. Pearlean Brownie).    Georgiana Spinner Aroor MD [facility] [phone]   If 7pm to 7am, please call on call as listed on AMION.

## 2018-07-29 NOTE — Discharge Instructions (Addendum)
You have been diagnosed today with transient confusion.  At this time there does not appear to be the presence of an emergent medical condition, however there is always the potential for conditions to change. Please read and follow the below instructions.  Please return to the Emergency Department immediately for any new or worsening symptoms. Please be sure to follow up with your Primary Care Provider within one week regarding your visit today; please call their office to schedule an appointment even if you are feeling better for a follow-up visit. Please call your neurologist office today to schedule an outpatient follow-up this week with them regarding your confusion and visit today. Your blood pressure was elevated today.  Please take your blood pressure medications as prescribed and follow-up with your primary care provider this week for blood pressure recheck and medication management.  Get help right away if: You get a very bad headache. You start to feel confused. You feel weak or numb. You feel faint. You get very bad pain in your: Chest. Belly (abdomen). You throw up (vomit) more than once. You have trouble breathing. Get help right away if you: Feel that you are not able to care for yourself. Develop headaches, repeated vomiting, seizures, blackouts, or slurred speech. Have increasing confusion, weakness, numbness, restlessness, or personality changes. Develop a loss of balance, have marked dizziness, feel uncoordinated, or fall. Develop severe anxiety, or you have delusions or hallucinations. You have fever Any new/concerning or worsening symptoms.  Please read the additional information packets attached to your discharge summary.

## 2018-07-29 NOTE — ED Notes (Signed)
Culture collected and sent to main lab.  

## 2018-08-03 ENCOUNTER — Telehealth: Payer: Self-pay | Admitting: Cardiology

## 2018-08-03 NOTE — Telephone Encounter (Signed)
Spoke w/ pt and requested that he send a manual transmission w/ his home monitor. Pt is at work and will call back at 3:30 to get help w/ his home monitor.

## 2018-08-04 NOTE — Telephone Encounter (Signed)
LMOVM for pt to return call 

## 2018-08-05 NOTE — Telephone Encounter (Signed)
I left a message for the pt to call me back on my direct work number to get help with sending his manual transmission with his home monitor.

## 2018-08-06 NOTE — Telephone Encounter (Signed)
This is the 3rd try to call the pt to help him send a manual transmission with his home monitor. I spoke with him and gave him my direct office number to call me when he get off work.

## 2018-08-07 NOTE — Telephone Encounter (Signed)
I sent the pt a letter 08/07/2018

## 2018-08-21 ENCOUNTER — Other Ambulatory Visit: Payer: Self-pay

## 2018-08-21 ENCOUNTER — Ambulatory Visit (INDEPENDENT_AMBULATORY_CARE_PROVIDER_SITE_OTHER): Payer: Medicare HMO | Admitting: *Deleted

## 2018-08-21 DIAGNOSIS — I639 Cerebral infarction, unspecified: Secondary | ICD-10-CM | POA: Diagnosis not present

## 2018-08-21 LAB — CUP PACEART REMOTE DEVICE CHECK
Date Time Interrogation Session: 20200501094316
Implantable Pulse Generator Implant Date: 20190708

## 2018-08-27 NOTE — Progress Notes (Signed)
[facility] Summary Report / Loop Recorder 

## 2018-09-23 ENCOUNTER — Ambulatory Visit (INDEPENDENT_AMBULATORY_CARE_PROVIDER_SITE_OTHER): Payer: Medicare HMO | Admitting: *Deleted

## 2018-09-23 DIAGNOSIS — I639 Cerebral infarction, unspecified: Secondary | ICD-10-CM | POA: Diagnosis not present

## 2018-09-23 LAB — CUP PACEART REMOTE DEVICE CHECK
Date Time Interrogation Session: 20200603121002
Implantable Pulse Generator Implant Date: 20190708

## 2018-09-24 ENCOUNTER — Emergency Department (HOSPITAL_COMMUNITY)
Admission: EM | Admit: 2018-09-24 | Discharge: 2018-09-25 | Disposition: A | Discharge status: Home / Self Care | Payer: Medicare HMO | Attending: Emergency Medicine | Admitting: Emergency Medicine

## 2018-09-24 ENCOUNTER — Other Ambulatory Visit: Payer: Self-pay

## 2018-09-24 ENCOUNTER — Emergency Department (HOSPITAL_COMMUNITY): Payer: Medicare HMO

## 2018-09-24 DIAGNOSIS — Z8673 Personal history of transient ischemic attack (TIA), and cerebral infarction without residual deficits: Secondary | ICD-10-CM | POA: Diagnosis not present

## 2018-09-24 DIAGNOSIS — Z87891 Personal history of nicotine dependence: Secondary | ICD-10-CM | POA: Insufficient documentation

## 2018-09-24 DIAGNOSIS — R519 Headache, unspecified: Secondary | ICD-10-CM

## 2018-09-24 DIAGNOSIS — Z79899 Other long term (current) drug therapy: Secondary | ICD-10-CM | POA: Insufficient documentation

## 2018-09-24 DIAGNOSIS — Z7982 Long term (current) use of aspirin: Secondary | ICD-10-CM | POA: Diagnosis not present

## 2018-09-24 DIAGNOSIS — R51 Headache: Secondary | ICD-10-CM | POA: Insufficient documentation

## 2018-09-24 DIAGNOSIS — I10 Essential (primary) hypertension: Secondary | ICD-10-CM | POA: Diagnosis not present

## 2018-09-24 DIAGNOSIS — Z1159 Encounter for screening for other viral diseases: Secondary | ICD-10-CM | POA: Insufficient documentation

## 2018-09-24 DIAGNOSIS — R4182 Altered mental status, unspecified: Secondary | ICD-10-CM | POA: Diagnosis present

## 2018-09-24 LAB — CBC
HCT: 43.9 % (ref 39.0–52.0)
Hemoglobin: 14.1 g/dL (ref 13.0–17.0)
MCH: 29 pg (ref 26.0–34.0)
MCHC: 32.1 g/dL (ref 30.0–36.0)
MCV: 90.3 fL (ref 80.0–100.0)
Platelets: 257 10*3/uL (ref 150–400)
RBC: 4.86 MIL/uL (ref 4.22–5.81)
RDW: 13.1 % (ref 11.5–15.5)
WBC: 9.3 10*3/uL (ref 4.0–10.5)
nRBC: 0 % (ref 0.0–0.2)

## 2018-09-24 LAB — COMPREHENSIVE METABOLIC PANEL
ALT: 28 U/L (ref 0–44)
AST: 20 U/L (ref 15–41)
Albumin: 3.4 g/dL — ABNORMAL LOW (ref 3.5–5.0)
Alkaline Phosphatase: 55 U/L (ref 38–126)
Anion gap: 6 (ref 5–15)
BUN: 11 mg/dL (ref 8–23)
CO2: 29 mmol/L (ref 22–32)
Calcium: 9.3 mg/dL (ref 8.9–10.3)
Chloride: 103 mmol/L (ref 98–111)
Creatinine, Ser: 1.38 mg/dL — ABNORMAL HIGH (ref 0.61–1.24)
GFR calc Af Amer: 59 mL/min — ABNORMAL LOW (ref >60)
GFR calc non Af Amer: 51 mL/min — ABNORMAL LOW (ref >60)
Glucose, Bld: 160 mg/dL — ABNORMAL HIGH (ref 70–99)
Potassium: 4 mmol/L (ref 3.5–5.1)
Sodium: 138 mmol/L (ref 135–145)
Total Bilirubin: 0.6 mg/dL (ref 0.3–1.2)
Total Protein: 6.8 g/dL (ref 6.5–8.1)

## 2018-09-24 LAB — SARS CORONAVIRUS 2 BY RT PCR (HOSPITAL ORDER, PERFORMED IN ~~LOC~~ HOSPITAL LAB): SARS Coronavirus 2: NEGATIVE

## 2018-09-24 MED ORDER — SODIUM CHLORIDE 0.9% FLUSH: 3.0000 mL | Freq: Once | INTRAVENOUS | Status: DC

## 2018-09-24 NOTE — ED Notes (Signed)
Assisted pt to call family

## 2018-09-24 NOTE — ED Notes (Signed)
Pt reminded about pee

## 2018-09-24 NOTE — Discharge Instructions (Addendum)
You were seen in the emergency department for evaluation of a headache and some possibly some confusion.  You did not show any confusion here and your headache improved.  You had a CAT scan and blood work that did not show any serious findings.  Please follow-up with your doctor and return if any worsening symptoms.

## 2018-09-24 NOTE — ED Triage Notes (Signed)
Came in via ems; c/o headache x 1 week; along w/ intermittent confusion w/ date; reported hx of Stroke and LBBB.

## 2018-09-24 NOTE — ED Notes (Signed)
Pt reminded of the need for urine.  

## 2018-09-24 NOTE — ED Notes (Signed)
Working with pt to confirm how he is getting home.

## 2018-09-24 NOTE — ED Provider Notes (Signed)
[facility]   CSN: [id number] Arrival date & time: 09/24/18  2012    History   Chief Complaint Chief Complaint  Patient presents with  . Altered Mental Status    HPI Jordan [name] is a 73 y.o. male.  He is brought in by EMS for possible confusion.  He is complaining of a headache that began today although he told EMS that is been going on for a week.  He says the headache is frontal head moderate in severity and he is taken some Tylenol without any improvement.  He denies a prior history of headaches like this.  He lives alone so it is unclear if this is confusion is something new or not.  He has a low-grade temperature here.  He denies any blurry vision numbness weakness cough abdominal pain vomiting or diarrhea or urinary symptoms.     The history is provided by the patient.  Altered Mental Status  Presenting symptoms: confusion and disorientation   Severity:  Mild Timing:  Unable to specify Progression:  Improving Associated symptoms: headaches   Associated symptoms: no abdominal pain, no difficulty breathing, no fever, no nausea, no rash, no seizures, no slurred speech and no vomiting   Headaches:    Severity:  Moderate   Onset quality:  Gradual   Timing:  Constant   Progression:  Unchanged   Chronicity:  New   Past Medical History:  Diagnosis Date  . BPH (benign prostatic hyperplasia)   . CVA (cerebral vascular accident) (HCC) 10/23/2017   CT confirms subacute R parietal infarct  . GERD (gastroesophageal reflux disease)   . High cholesterol   . Hypertension     Patient Active Problem List   Diagnosis Date Noted  . Chest pain 03/09/2018  . HTN (hypertension), benign   . History of ischemic left MCA stroke   . Ischemic stroke (HCC)   . Hyperlipidemia   . Essential hypertension   . CVA (cerebral vascular accident) (HCC) 10/23/2017    Past Surgical History:  Procedure Laterality Date  . LOOP RECORDER  INSERTION N/A 10/27/2017   Procedure: LOOP RECORDER INSERTION;  Surgeon: Marinus Mawaylor, Gregg W, MD;  [facility];  Service: Cardiovascular;  Laterality: N/A;  . NO PAST SURGERIES    . TEE WITHOUT CARDIOVERSION N/A 10/27/2017   Procedure: TRANSESOPHAGEAL ECHOCARDIOGRAM (TEE);  Surgeon: Pricilla Riffleoss, Paula V, MD;  [facility];  Service: Cardiovascular;  Laterality: N/A;        Home Medications    Prior to Admission medications   Medication Sig Start Date End Date Taking? Authorizing Provider  aspirin 81 MG chewable tablet Chew 324 mg by mouth once.    [provider]  clopidogrel (PLAVIX) 75 MG tablet Take 1 tablet (75 mg total) by mouth daily. 10/29/17   Bloomfield, Carley D, DO  losartan (COZAAR) 100 MG tablet Take 100 mg by mouth daily.    [provider]  nitroGLYCERIN (NITROSTAT) 0.4 MG SL tablet Place 1 tablet (0.4 mg total) under the tongue every 5 (five) minutes as needed for chest pain. 03/09/18   Beola CordMelvin, Alexander, MD  Omega-3 Fatty Acids (FISH OIL PO) Take 1 capsule by mouth daily.     [provider]  rosuvastatin (CRESTOR) 20 MG tablet Take 20 mg by mouth daily.    [provider]  sertraline (ZOLOFT) 50 MG tablet Take 1 tablet (50 mg total) by mouth daily. 10/28/17   Lenward ChancellorBloomfield, Carley D, DO  tamsulosin (FLOMAX) 0.4 MG CAPS capsule Take 0.4 mg by mouth daily with supper. 10/16/17   [provider]    Family History Family History  Problem Relation Age of Onset  . CVA Mother 38  . Stroke Mother   . Heart attack Father        35  . CVA Father   . Stroke Father     Social History Social History   Tobacco Use  . Smoking status: Former Smoker    Packs/day: 0.12    Years: 10.00    Pack years: 1.20    Types: Cigarettes, Pipe    Last attempt to quit: 1990    Years since quitting: 30.4  . Smokeless tobacco: Never Used  Substance Use Topics  . Alcohol use: Never    Frequency: Never  . Drug use: Never      Allergies   Patient has no known allergies.   Review of Systems Review of Systems  Constitutional: Negative for fever.  HENT: Negative for sore throat.   Eyes: Negative for visual disturbance.  Respiratory: Negative for shortness of breath.   Cardiovascular: Negative for chest pain.  Gastrointestinal: Negative for abdominal pain, nausea and vomiting.  Genitourinary: Negative for dysuria.  Musculoskeletal: Negative for neck pain.  Skin: Negative for rash.  Neurological: Positive for headaches. Negative for seizures.  Psychiatric/Behavioral: Positive for confusion.     Physical Exam Updated Vital Signs BP (!) 177/54 (BP Location: Right Arm)   Pulse 70   Temp 99.2 F (37.3 C) (Oral)   Ht 5\' 11"  (1.803 m)   Wt 81.6 kg   SpO2 95%   BMI 25.10 kg/m   Physical Exam Vitals signs and nursing note reviewed.  Constitutional:      Appearance: He is well-developed.  HENT:     Head: Normocephalic and atraumatic.  Eyes:     Conjunctiva/sclera: Conjunctivae normal.  Neck:     Musculoskeletal: Neck supple.  Cardiovascular:     Rate and Rhythm: Normal rate and regular rhythm.     Heart sounds: No murmur.  Pulmonary:     Effort: Pulmonary effort is normal. No respiratory distress.     Breath sounds: Normal breath sounds.  Abdominal:     Palpations: Abdomen is soft.     Tenderness: There is no abdominal tenderness.  Musculoskeletal: Normal range of motion.     Right lower leg: No edema.     Left lower leg: No edema.  Skin:    General: Skin is warm and dry.     Capillary Refill: Capillary refill takes less than 2 seconds.  Neurological:     General: No focal deficit present.     Mental Status: He is alert.     Sensory: No sensory deficit.     Motor: No weakness.     Comments: Patient is awake and alert.  He has no gross deficits.  He is oriented to self and place although thought the date was in May.      ED Treatments / Results  Labs (all labs ordered are listed, but  only abnormal results are displayed) Labs Reviewed  COMPREHENSIVE METABOLIC PANEL - Abnormal; Notable for the following components:      Result Value   Glucose, Bld 160 (*)    Creatinine, Ser 1.38 (*)    Albumin 3.4 (*)    GFR calc non Af Amer 51 (*)    GFR calc Af Amer 59 (*)    All other components  within normal limits  SARS CORONAVIRUS 2 (HOSPITAL ORDER, PERFORMED IN [facility])  CBC  URINALYSIS, ROUTINE W REFLEX MICROSCOPIC  CBG MONITORING, ED    EKG None  Radiology Ct Head Wo Contrast  Result Date: 09/24/2018 CLINICAL DATA:  73 year old male with headache. EXAM: CT HEAD WITHOUT CONTRAST TECHNIQUE: Contiguous axial images were obtained from the base of the skull through the vertex without intravenous contrast. COMPARISON:  Head CT dated 07/29/2018 and brain MRI dated 07/29/2018 FINDINGS: Brain: There is moderate age-related atrophy and chronic microvascular ischemic changes. Areas of old infarct and encephalomalacia noted in the right frontal lobe and right posterior parietal lobe. Focal old infarct is noted in the region of the left basal ganglia. There is no acute intracranial hemorrhage. No mass effect or midline shift. No extra-axial fluid collection. Vascular: No hyperdense vessel or unexpected calcification. Skull: Normal. Negative for fracture or focal lesion. Sinuses/Orbits: No acute finding. Other: None IMPRESSION: 1. No acute intracranial hemorrhage. 2. Age-related atrophy and chronic microvascular ischemic changes. Multiple old infarcts as described. Electronically Signed   By: Elgie Collard M.D.   On: 09/24/2018 20:42   Dg Chest Port 1 View  Result Date: 09/24/2018 CLINICAL DATA:  Altered mental status. EXAM: PORTABLE CHEST 1 VIEW COMPARISON:  Chest x-ray dated March 09, 2018. FINDINGS: The heart size and mediastinal contours are within normal limits. Unchanged loop recorder. Normal pulmonary vascularity. No focal consolidation, pleural effusion, or  pneumothorax. No acute osseous abnormality. IMPRESSION: No active disease. Electronically Signed   By: Obie Dredge M.D.   On: 09/24/2018 20:56    Procedures Procedures (including critical care time)  Medications Ordered in ED Medications  sodium chloride flush (NS) 0.9 % injection 3 mL (has no administration in time range)     Initial Impression / Assessment and Plan / ED Course  I have reviewed the triage vital signs and the nursing notes.  Pertinent labs & imaging results that were available during my care of the patient were reviewed by me and considered in my medical decision making (see chart for details).  Clinical Course as of Sep 24 1028  Thu Sep 24, 2018  2226 Reevaluated patient.  He is awake and alert denies any complaints.  He said his headache is resolved.  The nurse that he talked with his family and they seem to feel he was at his baseline.  I do not see any obvious reason for him to be admitted.  He does live alone and is not sure if he has a ride home so the nurses can work with him on that.   [MB]    Clinical Course User Index [MB] Terrilee Files, MD        Final Clinical Impressions(s) / ED Diagnoses   Final diagnoses:  Acute nonintractable headache, unspecified headache type    ED Discharge Orders    None       Terrilee Files, MD 09/25/18 1031

## 2018-09-25 ENCOUNTER — Encounter (HOSPITAL_COMMUNITY): Payer: Self-pay | Admitting: Emergency Medicine

## 2018-09-25 NOTE — ED Notes (Signed)
Son here to pick up pt

## 2018-09-25 NOTE — ED Notes (Addendum)
Explained to pt that he would need to wait until family can come and get him.  RN made him comfortable.  RN left a message on Hermine Messick phone [phone] requesting he call us back.

## 2018-09-25 NOTE — ED Notes (Signed)
Spoke to pts son Mr little and told him pt is ready to got home and that he needed to come and get him [phone] pt states he wants to know what is going and why his dad was sick , told son that results were normal as they could be, explained that the pt needed to f/u with his PCP and SW to assure  He was getting help to maintain his life OUTSIDE of the ER, son states t that is coming to get his dad, when pushed for a time  1-2 hours ,

## 2018-09-25 NOTE — ED Notes (Signed)
Wanita Chamberlain, sister, [phone] called to get update.  She [place] in another city and can not come and get him.  She is going to try to find someone to pick him up.

## 2018-09-25 NOTE — Progress Notes (Signed)
CSW spoke to patient's son Cullin Guenette at [phone] to determine his expected time of arrival to pick up his father and Idan reports it will be less than 30 minutes.  Edwin Dada, MSW, LCSW-A Clinical Social Worker [facility] [phone]

## 2018-09-25 NOTE — ED Notes (Signed)
Diet ordered spoke to karen 

## 2018-09-25 NOTE — ED Notes (Signed)
Son called again to come and get his dad , states is on his way

## 2018-09-25 NOTE — ED Notes (Signed)
RN called Delores (sister) to confirm someone was coming to pick pt up. She stated that she "texted son".  RN attempted to call son at [phone], no answer.  Called sister back, unable to give any additional assistance.  She did say that he had been confused all day and lives alone.

## 2018-09-25 NOTE — ED Notes (Signed)
Pt ambulatory to bathroom

## 2018-09-28 ENCOUNTER — Other Ambulatory Visit: Payer: Self-pay | Admitting: Internal Medicine

## 2018-09-28 DIAGNOSIS — G450 Vertebro-basilar artery syndrome: Secondary | ICD-10-CM

## 2018-10-01 NOTE — Progress Notes (Signed)
[facility] Summary Report / Loop Recorder 

## 2018-10-02 ENCOUNTER — Ambulatory Visit
Admission: RE | Admit: 2018-10-02 | Discharge: 2018-10-02 | Disposition: A | Discharge status: Home / Self Care | Payer: Medicare HMO | Source: Ambulatory Visit | Attending: Internal Medicine | Admitting: Internal Medicine

## 2018-10-02 DIAGNOSIS — G450 Vertebro-basilar artery syndrome: Secondary | ICD-10-CM

## 2018-10-05 ENCOUNTER — Encounter: Payer: Self-pay | Admitting: Cardiology

## 2018-10-05 NOTE — Telephone Encounter (Signed)
Certified letter mailed.

## 2018-10-16 ENCOUNTER — Telehealth: Payer: Self-pay

## 2018-10-16 NOTE — Telephone Encounter (Signed)
LMOVM pt needs to send a manual transmission with his home monitor.

## 2018-10-19 NOTE — Telephone Encounter (Signed)
Need a transmission with home monitor. No answer.

## 2018-10-20 NOTE — Telephone Encounter (Signed)
This is the 3rd message I have left for the pt to send a manual transmission or to call the device clinic if he needs help. I am sending a letter with instructions for the pt.

## 2018-10-26 ENCOUNTER — Ambulatory Visit (INDEPENDENT_AMBULATORY_CARE_PROVIDER_SITE_OTHER): Payer: Medicare HMO | Admitting: *Deleted

## 2018-10-26 DIAGNOSIS — I639 Cerebral infarction, unspecified: Secondary | ICD-10-CM

## 2018-10-26 LAB — CUP PACEART REMOTE DEVICE CHECK
Date Time Interrogation Session: 20200706124159
Implantable Pulse Generator Implant Date: 20190708

## 2018-10-27 ENCOUNTER — Telehealth: Payer: Self-pay | Admitting: *Deleted

## 2018-10-27 NOTE — Telephone Encounter (Signed)
LM for patient at home and cell numbers. Jordan [name] phone number for return call. Also contacted pt's sister (DPR). She agrees to call pt and ask him to call us back. Pt will also need to send a manual transmission when he calls back.  Brady episode noted on LINQ from 10/26/18 at 06:43, duration 15sec. Will ask if symptomatic with episode.

## 2018-10-28 NOTE — Telephone Encounter (Signed)
Spoke with pt, he denies symptoms with episode. Reports he may have been sleeping at the time. Agrees to call for any episodes of dizziness/syncope. Pt aware we will call back if any recommendations after MD review. No further questions at this time.

## 2018-11-02 NOTE — Telephone Encounter (Signed)
Pt spoke with Raquel Sarna on 10-27-2018

## 2018-11-03 NOTE — Progress Notes (Signed)
[facility] Summary Report / Loop Recorder 

## 2018-11-29 LAB — CUP PACEART REMOTE DEVICE CHECK
Date Time Interrogation Session: 20200808133544
Implantable Pulse Generator Implant Date: 20190708

## 2018-11-30 ENCOUNTER — Ambulatory Visit (INDEPENDENT_AMBULATORY_CARE_PROVIDER_SITE_OTHER): Payer: Medicare HMO | Admitting: *Deleted

## 2018-11-30 DIAGNOSIS — I639 Cerebral infarction, unspecified: Secondary | ICD-10-CM | POA: Diagnosis not present

## 2018-12-08 NOTE — Progress Notes (Signed)
[facility] Summary Report / Loop Recorder 

## 2018-12-31 ENCOUNTER — Ambulatory Visit (INDEPENDENT_AMBULATORY_CARE_PROVIDER_SITE_OTHER): Payer: Medicare HMO | Admitting: *Deleted

## 2018-12-31 DIAGNOSIS — I639 Cerebral infarction, unspecified: Secondary | ICD-10-CM | POA: Diagnosis not present

## 2018-12-31 LAB — CUP PACEART REMOTE DEVICE CHECK
Date Time Interrogation Session: 20200910121243
Implantable Pulse Generator Implant Date: 20190708

## 2019-01-11 NOTE — Progress Notes (Signed)
[facility] Summary Report / Loop Recorder 

## 2019-01-13 ENCOUNTER — Telehealth: Payer: Self-pay

## 2019-01-13 NOTE — Telephone Encounter (Signed)
Left message for patient to regarding disconnected monitor 

## 2019-02-02 ENCOUNTER — Encounter: Payer: Medicare HMO | Admitting: *Deleted

## 2019-02-17 ENCOUNTER — Other Ambulatory Visit: Payer: Self-pay | Admitting: Nephrology

## 2019-02-17 ENCOUNTER — Other Ambulatory Visit (HOSPITAL_COMMUNITY): Payer: Self-pay | Admitting: Nephrology

## 2019-02-17 DIAGNOSIS — I129 Hypertensive chronic kidney disease with stage 1 through stage 4 chronic kidney disease, or unspecified chronic kidney disease: Secondary | ICD-10-CM

## 2019-02-17 DIAGNOSIS — N1831 Chronic kidney disease, stage 3a: Secondary | ICD-10-CM

## 2019-02-20 ENCOUNTER — Emergency Department (HOSPITAL_COMMUNITY)
Admission: EM | Admit: 2019-02-20 | Discharge: 2019-02-20 | Disposition: A | Discharge status: Home / Self Care | Payer: Medicare Other | Attending: Emergency Medicine | Admitting: Emergency Medicine

## 2019-02-20 ENCOUNTER — Emergency Department (HOSPITAL_COMMUNITY): Payer: Medicare Other

## 2019-02-20 ENCOUNTER — Other Ambulatory Visit: Payer: Self-pay

## 2019-02-20 DIAGNOSIS — Z79899 Other long term (current) drug therapy: Secondary | ICD-10-CM | POA: Diagnosis not present

## 2019-02-20 DIAGNOSIS — E860 Dehydration: Secondary | ICD-10-CM | POA: Insufficient documentation

## 2019-02-20 DIAGNOSIS — R42 Dizziness and giddiness: Secondary | ICD-10-CM | POA: Insufficient documentation

## 2019-02-20 DIAGNOSIS — Z87891 Personal history of nicotine dependence: Secondary | ICD-10-CM | POA: Diagnosis not present

## 2019-02-20 DIAGNOSIS — I10 Essential (primary) hypertension: Secondary | ICD-10-CM | POA: Insufficient documentation

## 2019-02-20 DIAGNOSIS — Z7982 Long term (current) use of aspirin: Secondary | ICD-10-CM | POA: Diagnosis not present

## 2019-02-20 LAB — CBC
HCT: 44.1 % (ref 39.0–52.0)
Hemoglobin: 13.7 g/dL (ref 13.0–17.0)
MCH: 29 pg (ref 26.0–34.0)
MCHC: 31.1 g/dL (ref 30.0–36.0)
MCV: 93.4 fL (ref 80.0–100.0)
Platelets: 270 10*3/uL (ref 150–400)
RBC: 4.72 MIL/uL (ref 4.22–5.81)
RDW: 13.6 % (ref 11.5–15.5)
WBC: 6.5 10*3/uL (ref 4.0–10.5)
nRBC: 0 % (ref 0.0–0.2)

## 2019-02-20 LAB — URINALYSIS, ROUTINE W REFLEX MICROSCOPIC
Bilirubin Urine: NEGATIVE
Glucose, UA: NEGATIVE mg/dL
Hgb urine dipstick: NEGATIVE
Ketones, ur: NEGATIVE mg/dL
Leukocytes,Ua: NEGATIVE
Nitrite: NEGATIVE
Protein, ur: NEGATIVE mg/dL
Specific Gravity, Urine: 1.019 (ref 1.005–1.030)
pH: 6 (ref 5.0–8.0)

## 2019-02-20 LAB — BASIC METABOLIC PANEL
Anion gap: 9 (ref 5–15)
BUN: 11 mg/dL (ref 8–23)
CO2: 28 mmol/L (ref 22–32)
Calcium: 9.7 mg/dL (ref 8.9–10.3)
Chloride: 105 mmol/L (ref 98–111)
Creatinine, Ser: 1.32 mg/dL — ABNORMAL HIGH (ref 0.61–1.24)
GFR calc Af Amer: >60 mL/min (ref >60)
GFR calc non Af Amer: 54 mL/min — ABNORMAL LOW (ref >60)
Glucose, Bld: 144 mg/dL — ABNORMAL HIGH (ref 70–99)
Potassium: 4.3 mmol/L (ref 3.5–5.1)
Sodium: 142 mmol/L (ref 135–145)

## 2019-02-20 MED ORDER — SODIUM CHLORIDE 0.9 % IV BOLUS
1000.0000 mL | Freq: Once | INTRAVENOUS | Status: AC
  Administered 2019-02-20: 17:00:00 1000 mL via INTRAVENOUS

## 2019-02-20 NOTE — ED Notes (Signed)
Pt feels ok.

## 2019-02-20 NOTE — ED Notes (Signed)
Ambulated pt in hallway, pt states that dizziness is gone and felt better.

## 2019-02-20 NOTE — ED Notes (Signed)
The pt has been talking to his family  That are not here

## 2019-02-20 NOTE — ED Notes (Signed)
No pain c/o his bp being up and down which is unusual for him   And he ha salso had some dizziness for a few days   Alert oriented

## 2019-02-20 NOTE — ED Triage Notes (Signed)
Pt endorses intermittent dizziness x 1 week. Check his BP at home and I was 216/92, 148/79 in triage. Axox4. No neuro deficits. Hx of 3 cva's, no residual.

## 2019-02-20 NOTE — Discharge Instructions (Signed)
You are seen in the ER for dizziness. Fortunately, your symptoms have improved after IV hydration.  The labs are overall reassuring.  You were noted to have elevated blood pressure.  Our records indicate that you are only on one medication that you take at morning.  Please read the instructions provided on how to check your blood pressure and keep a log of your daily blood pressure.  Follow-up with your PCP in 1 to 2 weeks for optimal management of your blood pressure.

## 2019-02-20 NOTE — ED Notes (Signed)
To ct

## 2019-02-20 NOTE — ED Provider Notes (Signed)
[facility] Provider Note   CSN: [id number] Arrival date & time: 02/20/19  1502     History   Chief Complaint Chief Complaint  Patient presents with  . Dizziness    HPI Edmonia LynchJerry L Mucci is a 73 y.o. male.     HPI  73 year old with history of stroke, hypertension, hyperlipidemia, BPH comes in a chief complaint of dizziness. Patient reports that his dizziness started this morning at 8 AM when he woke up.  Last normal was last night when he went to bed.  Dizziness is actually present only when he is walking.  Symptoms resolved when he is laying flat.  He denies any associated headache, neck pain, chest pain, focal numbness, tingling, focal weakness, vision change.  Dizziness is described as lightheadedness and patient is not requiring support when he walks.  There is no history of similar symptoms and he denies any substance abuse, diarrhea, vomiting, reduced p.o. intake.  Past Medical History:  Diagnosis Date  . BPH (benign prostatic hyperplasia)   . CVA (cerebral vascular accident) (HCC) 10/23/2017   CT confirms subacute R parietal infarct  . GERD (gastroesophageal reflux disease)   . High cholesterol   . Hypertension     Patient Active Problem List   Diagnosis Date Noted  . Chest pain 03/09/2018  . HTN (hypertension), benign   . History of ischemic left MCA stroke   . Ischemic stroke (HCC)   . Hyperlipidemia   . Essential hypertension   . CVA (cerebral vascular accident) (HCC) 10/23/2017    Past Surgical History:  Procedure Laterality Date  . LOOP RECORDER INSERTION N/A 10/27/2017   Procedure: LOOP RECORDER INSERTION;  Surgeon: Marinus Mawaylor, Gregg W, MD;  [facility];  Service: Cardiovascular;  Laterality: N/A;  . NO PAST SURGERIES    . TEE WITHOUT CARDIOVERSION N/A 10/27/2017   Procedure: TRANSESOPHAGEAL ECHOCARDIOGRAM (TEE);  Surgeon: Pricilla Riffleoss, Paula V, MD;  [facility];  Service: Cardiovascular;  Laterality: N/A;         Home Medications    Prior to Admission medications   Medication Sig Start Date End Date Taking? Authorizing Provider  acetaminophen (TYLENOL) 500 MG tablet Take 1,000 mg by mouth daily as needed for headache (pain).   Yes [provider]  Ascorbic Acid (VITAMIN C) 1000 MG tablet Take 1,000 mg by mouth daily.   Yes [provider]  aspirin 81 MG chewable tablet Chew 81 mg by mouth daily.    Yes [provider]  Cholecalciferol (VITAMIN D3) 1.25 MG (50000 UT) CAPS Take 50,000 Units by mouth every Saturday.   Yes [provider]  losartan (COZAAR) 100 MG tablet Take 100 mg by mouth daily.   Yes [provider]  Multiple Vitamin (MULTIVITAMIN WITH MINERALS) TABS tablet Take 1 tablet by mouth daily.   Yes [provider]  nitroGLYCERIN (NITROSTAT) 0.4 MG SL tablet Place 1 tablet (0.4 mg total) under the tongue every 5 (five) minutes as needed for chest pain. 03/09/18  Yes Beola CordMelvin, Alexander, MD  Omega-3 Fatty Acids (FISH OIL) 1200 MG CAPS Take 1,200 mg by mouth daily.    Yes [provider]  Red Yeast Rice 600 MG TABS Take 600 mg by mouth daily.   Yes [provider]  rosuvastatin (CRESTOR) 20 MG tablet Take 20 mg by mouth daily.   Yes [provider]  senna (SENOKOT) 8.6 MG TABS tablet Take 1 tablet by mouth daily as needed for mild  constipation.   Yes [provider]  clopidogrel (PLAVIX) 75 MG tablet Take 1 tablet (75 mg total) by mouth daily. Patient not taking: Reported on 02/20/2019 10/29/17   Modena Nunnery D, DO  sertraline (ZOLOFT) 50 MG tablet Take 1 tablet (50 mg total) by mouth daily. Patient not taking: Reported on 02/20/2019 10/28/17   Modena Nunnery D, DO    Family History Family History  Problem Relation Age of Onset  . CVA Mother 62  . Stroke Mother   . Heart attack Father        1  . CVA Father   . Stroke Father     Social History Social History   Tobacco Use  .  Smoking status: Former Smoker    Packs/day: 0.12    Years: 10.00    Pack years: 1.20    Types: Cigarettes, Pipe    Quit date: 1990    Years since quitting: 30.8  . Smokeless tobacco: Never Used  Substance Use Topics  . Alcohol use: Never    Frequency: Never  . Drug use: Never     Allergies   Patient has no known allergies.   Review of Systems Review of Systems  Constitutional: Positive for activity change.  Respiratory: Negative for shortness of breath.   Cardiovascular: Negative for chest pain.  Gastrointestinal: Negative for nausea.  Musculoskeletal: Negative for neck pain and neck stiffness.  Allergic/Immunologic: Negative for immunocompromised state.  Neurological: Positive for dizziness and light-headedness. Negative for syncope, facial asymmetry, speech difficulty, weakness, numbness and headaches.  All other systems reviewed and are negative.    Physical Exam Updated Vital Signs BP (!) 172/96   Pulse 72   Temp 98.7 F (37.1 C) (Oral)   Resp 17   SpO2 98%   Physical Exam Vitals signs and nursing note reviewed.  Constitutional:      Appearance: He is well-developed.  HENT:     Head: Atraumatic.  Eyes:     Extraocular Movements: Extraocular movements intact.     Pupils: Pupils are equal, round, and reactive to light.  Neck:     Musculoskeletal: Neck supple. No neck rigidity.  Cardiovascular:     Rate and Rhythm: Normal rate.  Pulmonary:     Effort: Pulmonary effort is normal.  Skin:    General: Skin is warm.  Neurological:     Mental Status: He is alert and oriented to person, place, and time.     Comments: Cerebellar exam is normal (finger to nose) Sensory exam normal for bilateral upper and lower extremities - and patient is able to discriminate between sharp and dull. Motor exam is 4+/5       ED Treatments / Results  Labs (all labs ordered are listed, but only abnormal results are displayed) Labs Reviewed  BASIC METABOLIC PANEL -  Abnormal; Notable for the following components:      Result Value   Glucose, Bld 144 (*)    Creatinine, Ser 1.32 (*)    GFR calc non Af Amer 54 (*)    All other components within normal limits  CBC  URINALYSIS, ROUTINE W REFLEX MICROSCOPIC    EKG EKG Interpretation  Date/Time:  Saturday February 20 2019 15:10:10 EDT Ventricular Rate:  87 PR Interval:  154 QRS Duration: 134 QT Interval:  380 QTC Calculation: 457 R Axis:   99 Text Interpretation: Normal sinus rhythm Rightward axis Non-specific intra-ventricular conduction block Minimal voltage criteria for LVH, may be normal variant ( Sokolow-Lyon ) Cannot  rule out Anterior infarct , age undetermined Marked T-wave abnormality, consider inferolateral ischemia Abnormal ECG TWI in the inferior and lateral leads are new nonspecific ST elevation in anterior leads is not new Confirmed by Derwood Kaplan [phone]) on 02/20/2019 5:51:44 PM   Radiology Ct Head Wo Contrast  Result Date: 02/20/2019 CLINICAL DATA:  Focal neurological deficit of more than 6 hours question stroke, intermittent dizziness for a week, hypertensive today, history hypertension, stroke, GERD, former smoker EXAM: CT HEAD WITHOUT CONTRAST TECHNIQUE: Contiguous axial images were obtained from the base of the skull through the vertex without intravenous contrast. Sagittal and coronal MPR images reconstructed from axial data set. COMPARISON:  09/24/2018 FINDINGS: Brain: Generalized atrophy. Stable ventricular morphology. No midline shift or mass effect. Small vessel chronic ischemic changes of deep cerebral white matter. Old lacunar infarct at anterior limb of LEFT internal capsule. Watershed infarct superiorly in RIGHT hemisphere. Old LEFT occipital infarct. No intracranial hemorrhage, mass lesion or evidence of acute infarction. No extra-axial fluid collections. Vascular: Atherosclerotic calcification of internal carotid arteries at skull base. No definite hyperdense vessels. 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 Remaining paranasal sinuses and mastoid air cells clear Other: N/A IMPRESSION: Atrophy with small vessel chronic ischemic changes of deep cerebral white matter. Multiple old infarcts as above. No acute intracranial abnormalities. Electronically Signed   By: Ulyses Southward M.D.   On: 02/20/2019 15:59    Procedures Procedures (including critical care time)  Medications Ordered in ED Medications  sodium chloride 0.9 % bolus 1,000 mL (0 mLs Intravenous Stopped 02/20/19 1822)     Initial Impression / Assessment and Plan / ED Course  I have reviewed the triage vital signs and the nursing notes.  Pertinent labs & imaging results that were available during my care of the patient were reviewed by me and considered in my medical decision making (see chart for details).  Clinical Course as of Feb 20 1947  Sat Feb 20, 2019  4665 The patient appears reasonably screened and/or stabilized for discharge and I doubt any other medical condition or other [facility] requiring further screening, evaluation, or treatment in the ED at this time prior to discharge.  Results from the ER workup discussed with the patient face to face and all questions answered to the best of my ability. The patient is safe for discharge with strict return precautions.  Patient states that his dizziness has resolved.  He has ambulated without any problems.  No hypoxia noted on reassessment.  BP slightly elevated throughout the ED stay.  We discussed need for close follow-up with PCP for optimal BP management and strict ER return precautions were also discussed at that time.  Patient is comfortable with the plan.   [AN]    Clinical Course User Index [AN] Derwood Kaplan, MD       73 year old male comes in a chief complaint of dizziness.  Dizziness appears to be positional as it only comes about when he is walking and it gets better when he sits down.  Additionally there  is no vertigo and no focal neurologic complaints on history or deficits on exam.  No meningismus or chest pain either.  We will get orthostatics.  He is not on any new medications which could be contributing.  He also denies any fluid loss on history which is positive.  I doubt that patient has a stroke as the symptoms are not present when he is laying flat. EKG looks markedly different than previous  EKGs but he denies any chest pain.  We will check with him again to see if he has any shortness of breath.  Final Clinical Impressions(s) / ED Diagnoses   Final diagnoses:  Dizziness  Dehydration  Essential hypertension    ED Discharge Orders    None       Derwood Kaplan, MD 02/20/19 1948

## 2019-02-21 ENCOUNTER — Ambulatory Visit (HOSPITAL_COMMUNITY)
Admission: EM | Admit: 2019-02-21 | Discharge: 2019-02-21 | Disposition: A | Discharge status: Home / Self Care | Payer: Medicare Other | Attending: Family Medicine | Admitting: Family Medicine

## 2019-02-21 ENCOUNTER — Other Ambulatory Visit: Payer: Self-pay

## 2019-02-21 ENCOUNTER — Encounter (HOSPITAL_COMMUNITY): Payer: Self-pay

## 2019-02-21 DIAGNOSIS — I10 Essential (primary) hypertension: Secondary | ICD-10-CM

## 2019-02-21 MED ORDER — TRIAMTERENE-HCTZ 37.5-25 MG PO TABS: 1.0000 | ORAL_TABLET | Freq: Every day | ORAL | 1 refills | Status: DC

## 2019-02-21 NOTE — ED Triage Notes (Signed)
Pt Present elevated blood pressure, symptoms started yesterday. Pt takes his medication every morning.

## 2019-02-21 NOTE — Discharge Instructions (Addendum)
As we discussed, break losartan in half and take half in the morning and half at bedtime Continue to monitor pressure twice daily Follow-up with primary care physician this week

## 2019-02-21 NOTE — ED Provider Notes (Signed)
[place]    CSN: [id number] Arrival date & time: 02/21/19  1245      History   Chief Complaint Chief Complaint  Patient presents with   Hypertension    HPI Jordan [name] is a 73 y.o. male.   Patient was seen in emergency room yesterday with dizziness.  He was given some fluids and told to follow-up with his primary care doctor.  He is here today because blood pressure has been elevated.  He monitors his pressure at home with a wrist cuff.  Only medication is losartan 100 mg that he takes in the mornings.  He does have a history of several CVAs.  He denies any headache chest pain and also denies dizziness today.  This morning pressure was in the 200s by history.  HPI  Past Medical History:  Diagnosis Date   BPH (benign prostatic hyperplasia)    CVA (cerebral vascular accident) ([place]) 10/23/2017   CT confirms subacute R parietal infarct   GERD (gastroesophageal reflux disease)    High cholesterol    Hypertension     Patient Active Problem List   Diagnosis Date Noted   Chest pain 03/09/2018   HTN (hypertension), benign    History of ischemic left MCA stroke    Ischemic stroke ([place])    Hyperlipidemia    Essential hypertension    CVA (cerebral vascular accident) ([place]) 10/23/2017    Past Surgical History:  Procedure Laterality Date   LOOP RECORDER INSERTION N/A 10/27/2017   Procedure: LOOP RECORDER INSERTION;  Surgeon: Evans Lance, MD;  Location: [place] CV LAB;  Service: Cardiovascular;  Laterality: N/A;   NO PAST SURGERIES     TEE WITHOUT CARDIOVERSION N/A 10/27/2017   Procedure: TRANSESOPHAGEAL ECHOCARDIOGRAM (TEE);  Surgeon: Fay Records, MD;  Location: [facility] ENDOSCOPY;  Service: Cardiovascular;  Laterality: N/A;       Home Medications    Prior to Admission medications   Medication Sig Start Date End Date Taking? Authorizing Provider  acetaminophen (TYLENOL) 500 MG tablet Take 1,000 mg by mouth daily as needed for headache (pain).     [provider]  Ascorbic Acid (VITAMIN C) 1000 MG tablet Take 1,000 mg by mouth daily.    [provider]  aspirin 81 MG chewable tablet Chew 81 mg by mouth daily.     [provider]  Cholecalciferol (VITAMIN D3) 1.25 MG (50000 UT) CAPS Take 50,000 Units by mouth every Saturday.    [provider]  clopidogrel (PLAVIX) 75 MG tablet Take 1 tablet (75 mg total) by mouth daily. Patient not taking: Reported on 02/20/2019 10/29/17   Modena Nunnery D, DO  losartan (COZAAR) 100 MG tablet Take 100 mg by mouth daily.    [provider]  Multiple Vitamin (MULTIVITAMIN WITH MINERALS) TABS tablet Take 1 tablet by mouth daily.    [provider]  nitroGLYCERIN (NITROSTAT) 0.4 MG SL tablet Place 1 tablet (0.4 mg total) under the tongue every 5 (five) minutes as needed for chest pain. 03/09/18   Neva Seat, MD  Omega-3 Fatty Acids (FISH OIL) 1200 MG CAPS Take 1,200 mg by mouth daily.     [provider]  Red Yeast Rice 600 MG TABS Take 600 mg by mouth daily.    [provider]  rosuvastatin (CRESTOR) 20 MG tablet Take 20 mg by mouth daily.    [provider]  senna (SENOKOT) 8.6 MG TABS tablet Take 1 tablet by mouth daily as needed  for mild constipation.    [provider]  sertraline (ZOLOFT) 50 MG tablet Take 1 tablet (50 mg total) by mouth daily. Patient not taking: Reported on 02/20/2019 10/28/17   Bridget HartshornBloomfield, Carley D, DO    Family History Family History  Problem Relation Age of Onset   CVA Mother 5491   Stroke Mother    Heart attack Father        6875   CVA Father    Stroke Father     Social History Social History   Tobacco Use   Smoking status: Former Smoker    Packs/day: 0.12    Years: 10.00    Pack years: 1.20    Types: Cigarettes, Pipe    Quit date: 1990    Years since quitting: 30.8   Smokeless tobacco: Never Used  Substance Use Topics   Alcohol use: Never    Frequency:  Never   Drug use: Never     Allergies   Patient has no known allergies.   Review of Systems Review of Systems  Neurological: Positive for dizziness.  All other systems reviewed and are negative.    Physical Exam Triage Vital Signs ED Triage Vitals  Enc Vitals Group     BP 02/21/19 1305 (!) 159/94     Pulse Rate 02/21/19 1305 77     Resp 02/21/19 1305 17     Temp 02/21/19 1305 98.9 F (37.2 C)     Temp Source 02/21/19 1305 Oral     SpO2 02/21/19 1305 99 %     Weight --      Height --      Head Circumference --      Peak Flow --      Pain Score 02/21/19 1304 0     Pain Loc --      Pain Edu? --      Excl. in GC? --    No data found.  Updated Vital Signs BP (!) 159/94 (BP Location: Left Arm)    Pulse 77    Temp 98.9 F (37.2 C) (Oral)    Resp 17    SpO2 99%   Visual Acuity Right Eye Distance:   Left Eye Distance:   Bilateral Distance:    Right Eye Near:   Left Eye Near:    Bilateral Near:     Physical Exam Vitals signs and nursing note reviewed.  Constitutional:      Appearance: Normal appearance. He is normal weight.  Eyes:     Pupils: Pupils are equal, round, and reactive to light.  Cardiovascular:     Rate and Rhythm: Normal rate and regular rhythm.     Pulses: Normal pulses.     Heart sounds: Normal heart sounds.  Pulmonary:     Effort: Pulmonary effort is normal.     Breath sounds: Normal breath sounds.  Neurological:     General: No focal deficit present.     Mental Status: He is alert and oriented to person, place, and time.      UC Treatments / Results  Labs (all labs ordered are listed, but only abnormal results are displayed) Labs Reviewed - No data to display  EKG   Radiology Ct Head Wo Contrast  Result Date: 02/20/2019 CLINICAL DATA:  Focal neurological deficit of more than 6 hours question stroke, intermittent dizziness for a week, hypertensive today, history hypertension, stroke, GERD, former smoker EXAM: CT HEAD WITHOUT  CONTRAST TECHNIQUE: Contiguous axial images were obtained from the  base of the skull through the vertex without intravenous contrast. Sagittal and coronal MPR images reconstructed from axial data set. COMPARISON:  09/24/2018 FINDINGS: Brain: Generalized atrophy. Stable ventricular morphology. No midline shift or mass effect. Small vessel chronic ischemic changes of deep cerebral white matter. Old lacunar infarct at anterior limb of LEFT internal capsule. Watershed infarct superiorly in RIGHT hemisphere. Old LEFT occipital infarct. No intracranial hemorrhage, mass lesion or evidence of acute infarction. No extra-axial fluid collections. Vascular: Atherosclerotic calcification of internal carotid arteries at skull base. No definite hyperdense vessels. 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 Remaining paranasal sinuses and mastoid air cells clear Other: N/A IMPRESSION: Atrophy with small vessel chronic ischemic changes of deep cerebral white matter. Multiple old infarcts as above. No acute intracranial abnormalities. Electronically Signed   By: Ulyses Southward M.D.   On: 02/20/2019 15:59    Procedures Procedures (including critical care time)  Medications Ordered in UC Medications - No data to display  Initial Impression / Assessment and Plan / UC Course  I have reviewed the triage vital signs and the nursing notes.  Pertinent labs & imaging results that were available during my care of the patient were reviewed by me and considered in my medical decision making (see chart for details).     Hypertension, moderately elevated today.  Will adjust losartan to take twice daily since it probably does not last 24 hours.  Also add low-dose hydrochlorothiazide Final Clinical Impressions(s) / UC Diagnoses   Final diagnoses:  None   Discharge Instructions   None    ED Prescriptions    None     PDMP not reviewed this encounter.   Frederica Kuster, MD 02/21/19 1322

## 2019-02-24 ENCOUNTER — Ambulatory Visit (HOSPITAL_COMMUNITY)
Admission: RE | Admit: 2019-02-24 | Discharge: 2019-02-24 | Disposition: A | Discharge status: Home / Self Care | Payer: Medicare Other | Source: Ambulatory Visit | Attending: Nephrology | Admitting: Nephrology

## 2019-02-24 ENCOUNTER — Other Ambulatory Visit: Payer: Self-pay

## 2019-02-24 DIAGNOSIS — N1831 Chronic kidney disease, stage 3a: Secondary | ICD-10-CM | POA: Insufficient documentation

## 2019-02-24 DIAGNOSIS — I129 Hypertensive chronic kidney disease with stage 1 through stage 4 chronic kidney disease, or unspecified chronic kidney disease: Secondary | ICD-10-CM | POA: Diagnosis present

## 2019-03-08 ENCOUNTER — Encounter: Payer: Medicare Other | Admitting: *Deleted

## 2019-05-25 DIAGNOSIS — I6789 Other cerebrovascular disease: Secondary | ICD-10-CM | POA: Diagnosis not present

## 2019-05-25 DIAGNOSIS — E785 Hyperlipidemia, unspecified: Secondary | ICD-10-CM | POA: Diagnosis not present

## 2019-05-25 DIAGNOSIS — E119 Type 2 diabetes mellitus without complications: Secondary | ICD-10-CM | POA: Diagnosis not present

## 2019-05-25 DIAGNOSIS — E559 Vitamin D deficiency, unspecified: Secondary | ICD-10-CM | POA: Diagnosis not present

## 2019-05-25 DIAGNOSIS — I10 Essential (primary) hypertension: Secondary | ICD-10-CM | POA: Diagnosis not present

## 2019-05-25 DIAGNOSIS — N289 Disorder of kidney and ureter, unspecified: Secondary | ICD-10-CM | POA: Diagnosis not present

## 2019-07-20 ENCOUNTER — Other Ambulatory Visit: Payer: Self-pay | Admitting: Family Medicine

## 2019-12-30 ENCOUNTER — Emergency Department (HOSPITAL_COMMUNITY): Payer: Medicare HMO

## 2019-12-30 ENCOUNTER — Emergency Department (HOSPITAL_COMMUNITY)
Admission: EM | Admit: 2019-12-30 | Discharge: 2019-12-31 | Disposition: A | Discharge status: Home / Self Care | Payer: Medicare HMO | Attending: Emergency Medicine | Admitting: Emergency Medicine

## 2019-12-30 ENCOUNTER — Other Ambulatory Visit: Payer: Self-pay

## 2019-12-30 ENCOUNTER — Encounter (HOSPITAL_COMMUNITY): Payer: Self-pay

## 2019-12-30 DIAGNOSIS — R0789 Other chest pain: Secondary | ICD-10-CM | POA: Insufficient documentation

## 2019-12-30 DIAGNOSIS — Z7982 Long term (current) use of aspirin: Secondary | ICD-10-CM | POA: Diagnosis not present

## 2019-12-30 DIAGNOSIS — R61 Generalized hyperhidrosis: Secondary | ICD-10-CM | POA: Insufficient documentation

## 2019-12-30 DIAGNOSIS — Z7901 Long term (current) use of anticoagulants: Secondary | ICD-10-CM | POA: Diagnosis not present

## 2019-12-30 DIAGNOSIS — I10 Essential (primary) hypertension: Secondary | ICD-10-CM | POA: Insufficient documentation

## 2019-12-30 DIAGNOSIS — R42 Dizziness and giddiness: Secondary | ICD-10-CM | POA: Insufficient documentation

## 2019-12-30 DIAGNOSIS — R5383 Other fatigue: Secondary | ICD-10-CM | POA: Diagnosis not present

## 2019-12-30 DIAGNOSIS — Z87891 Personal history of nicotine dependence: Secondary | ICD-10-CM | POA: Insufficient documentation

## 2019-12-30 DIAGNOSIS — R079 Chest pain, unspecified: Secondary | ICD-10-CM | POA: Diagnosis present

## 2019-12-30 DIAGNOSIS — Z79899 Other long term (current) drug therapy: Secondary | ICD-10-CM | POA: Diagnosis not present

## 2019-12-30 DIAGNOSIS — R0602 Shortness of breath: Secondary | ICD-10-CM | POA: Diagnosis not present

## 2019-12-30 DIAGNOSIS — Z20822 Contact with and (suspected) exposure to covid-19: Secondary | ICD-10-CM | POA: Diagnosis not present

## 2019-12-30 LAB — BASIC METABOLIC PANEL
Anion gap: 10 (ref 5–15)
BUN: 18 mg/dL (ref 8–23)
CO2: 25 mmol/L (ref 22–32)
Calcium: 10.1 mg/dL (ref 8.9–10.3)
Chloride: 102 mmol/L (ref 98–111)
Creatinine, Ser: 1.7 mg/dL — ABNORMAL HIGH (ref 0.61–1.24)
GFR calc Af Amer: 45 mL/min — ABNORMAL LOW (ref >60)
GFR calc non Af Amer: 39 mL/min — ABNORMAL LOW (ref >60)
Glucose, Bld: 193 mg/dL — ABNORMAL HIGH (ref 70–99)
Potassium: 4.4 mmol/L (ref 3.5–5.1)
Sodium: 137 mmol/L (ref 135–145)

## 2019-12-30 LAB — CBC
HCT: 47.4 % (ref 39.0–52.0)
Hemoglobin: 14.7 g/dL (ref 13.0–17.0)
MCH: 29.7 pg (ref 26.0–34.0)
MCHC: 31 g/dL (ref 30.0–36.0)
MCV: 95.8 fL (ref 80.0–100.0)
Platelets: 283 10*3/uL (ref 150–400)
RBC: 4.95 MIL/uL (ref 4.22–5.81)
RDW: 13.3 % (ref 11.5–15.5)
WBC: 6.2 10*3/uL (ref 4.0–10.5)
nRBC: 0 % (ref 0.0–0.2)

## 2019-12-30 LAB — TROPONIN I (HIGH SENSITIVITY)
Troponin I (High Sensitivity): 6 ng/L (ref <18)
Troponin I (High Sensitivity): 7 ng/L (ref <18)

## 2019-12-30 NOTE — ED Triage Notes (Signed)
Pt arrives to ED w/ c/o sudden onset of centrally located non-radiating chest pain that started today and lasted about 15 mins but had resolved by EMS arrival w/o intervention. Pt endorses diaphoresis at that time. Pt endorses family hx of MI, denies personal cardiac hx. Pt received 324 mg aspirin w/ EMS, but no nitro as symptoms resolved.

## 2019-12-30 NOTE — Discharge Instructions (Addendum)
You have been seen here for chest pain.  Lab work and imaging all look reassuring.  I want to continue taking your home medication as prescribed.  I have given you the contact information for a cardiologist I want you to call them at your earliest convenience I feel you need further follow-up and evaluation.  I want to come back to emerge department if you develop chest pain, shortness of breath, uncontrolled nausea, vomiting, diarrhea as these symptoms require further evaluation management.

## 2019-12-30 NOTE — ED Provider Notes (Signed)
[facility] EMERGENCY DEPARTMENT Provider Note   CSN: [id number] Arrival date & time: 12/30/19  1149     History Chief Complaint  Patient presents with  . Chest Pain    Jordan [name] is a 74 y.o. male.  HPI   Patient with significant medical history of CVA, hypertension, hyperlipidemia presents to the hospital for chief complaint of chest pain.  Patient explains while he was sitting at his kitchen table he started to feel ill and started to have chest pain which made him go lay down.  He states he become very diaphoretic, short of breath, become lightheaded.  He he described the pain as a sharp stabbing pain in his left upper chest.  He denies radiating pain, becoming nauseous or vomiting, paresthesias or weakness in his upper or lower extremities.  He mentioned that this happened on Sunday while he was sitting down he experienced the same episode.  He explains this episode lasted about 30 minutes and then went away on its own.  Patient also admits that over the last few days he has increased dyspnea on exertion but denies leg pain, leg swelling, recent travels, on hormone therapy, no history of DVTs or PEs.  Patient denies headache, fever, chills, abdominal pain, nausea, vomiting, diarrhea, dysuria, pedal edema.  Past Medical History:  Diagnosis Date  . BPH (benign prostatic hyperplasia)   . CVA (cerebral vascular accident) (HCC) 10/23/2017   CT confirms subacute R parietal infarct  . GERD (gastroesophageal reflux disease)   . High cholesterol   . Hypertension     Patient Active Problem List   Diagnosis Date Noted  . Chest pain 03/09/2018  . HTN (hypertension), benign   . History of ischemic left MCA stroke   . Ischemic stroke (HCC)   . Hyperlipidemia   . Essential hypertension   . CVA (cerebral vascular accident) (HCC) 10/23/2017    Past Surgical History:  Procedure Laterality Date  . LOOP RECORDER INSERTION N/A 10/27/2017   Procedure: LOOP RECORDER  INSERTION;  Surgeon: Taylor, Gregg W, MD;  Location: [facility] INVASIVE CV LAB;  Service: Cardiovascular;  Laterality: N/A;  . NO PAST SURGERIES    . TEE WITHOUT CARDIOVERSION N/A 10/27/2017   Procedure: TRANSESOPHAGEAL ECHOCARDIOGRAM (TEE);  Surgeon: Ross, Paula V, MD;  Location: [facility] ENDOSCOPY;  Service: Cardiovascular;  Laterality: N/A;       Family History  Problem Relation Age of Onset  . CVA Mother 91  . Stroke Mother   . Heart attack Father        75   . CVA Father   . Stroke Father     Social History   Tobacco Use  . Smoking status: Former Smoker    Packs/day: 0.12    Years: 10.00    Pack years: 1.20    Types: Cigarettes, Pipe    Quit date: 1990    Years since quitting: 31.7  . Smokeless tobacco: Never Used  Vaping Use  . Vaping Use: Never used  Substance Use Topics  . Alcohol use: Never  . Drug use: Never    Home Medications Prior to Admission medications   Medication Sig Start Date End Date Taking? Authorizing Provider  acetaminophen (TYLENOL) 500 MG tablet Take 1,000 mg by mouth daily as needed for headache (pain).    [provider]  Ascorbic Acid (VITAMIN C) 1000 MG tablet Take 1,000 mg by mouth daily.    [provider]  aspirin 81 MG chewable tablet Chew 81 mg  by mouth daily.     [provider]  Cholecalciferol (VITAMIN D3) 1.25 MG (50000 UT) CAPS Take 50,000 Units by mouth every Saturday.    [provider]  clopidogrel (PLAVIX) 75 MG tablet Take 1 tablet (75 mg total) by mouth daily. Patient not taking: Reported on 02/20/2019 10/29/17   Lenward ChancellorBloomfield, Carley D, DO  losartan (COZAAR) 100 MG tablet Take 100 mg by mouth daily.    [provider]  Multiple Vitamin (MULTIVITAMIN WITH MINERALS) TABS tablet Take 1 tablet by mouth daily.    [provider]  nitroGLYCERIN (NITROSTAT) 0.4 MG SL tablet Place 1 tablet (0.4 mg total) under the tongue every 5 (five) minutes as needed for chest pain. 03/09/18   Synetta FailMelvin, Alexander  B, MD  Omega-3 Fatty Acids (FISH OIL) 1200 MG CAPS Take 1,200 mg by mouth daily.     [provider]  Red Yeast Rice 600 MG TABS Take 600 mg by mouth daily.    [provider]  rosuvastatin (CRESTOR) 20 MG tablet Take 20 mg by mouth daily.    [provider]  senna (SENOKOT) 8.6 MG TABS tablet Take 1 tablet by mouth daily as needed for mild constipation.    [provider]  sertraline (ZOLOFT) 50 MG tablet Take 1 tablet (50 mg total) by mouth daily. Patient not taking: Reported on 02/20/2019 10/28/17   Lenward ChancellorBloomfield, Carley D, DO  triamterene-hydrochlorothiazide (MAXZIDE-25) 37.5-25 MG tablet Take 1 tablet by mouth daily. 02/21/19   Frederica KusterMiller, Stephen M, MD    Allergies    Patient has no known allergies.  Review of Systems   Review of Systems  Constitutional: Positive for diaphoresis and fatigue. Negative for chills and fever.  HENT: Negative for congestion, tinnitus and trouble swallowing.   Eyes: Negative for visual disturbance.  Respiratory: Positive for shortness of breath. Negative for cough.   Cardiovascular: Positive for chest pain. Negative for leg swelling.  Gastrointestinal: Negative for abdominal pain, diarrhea, nausea and vomiting.  Genitourinary: Negative for enuresis and frequency.  Musculoskeletal: Negative for back pain.  Skin: Negative for rash.  Neurological: Positive for light-headedness. Negative for dizziness.  Hematological: Does not bruise/bleed easily.    Physical Exam Updated Vital Signs BP 132/75   Pulse 85   Temp 98 F (36.7 C) (Oral)   Resp 14   Ht 5\' 11"  (1.803 m)   Wt 79.4 kg   SpO2 94%   BMI 24.41 kg/m   Physical Exam Vitals and nursing note reviewed.  Constitutional:      General: He is not in acute distress.    Appearance: He is not ill-appearing.  HENT:     Head: Normocephalic and atraumatic.     Nose: No congestion.     Mouth/Throat:     Mouth: Mucous membranes are moist.     Pharynx: Oropharynx is clear.   Eyes:     General: No scleral icterus. Cardiovascular:     Rate and Rhythm: Normal rate and regular rhythm.     Pulses: Normal pulses.     Heart sounds: No murmur heard.  No friction rub. No gallop.      Comments: Chest was evaluated, no gross abnormalities noted.  Patient had good rise and fall during breathing, no flail chest noted.  Chest was nontender to palpation. Pulmonary:     Effort: No respiratory distress.     Breath sounds: No wheezing, rhonchi or rales.  Abdominal:     General: There is no distension.  Tenderness: There is no abdominal tenderness. There is no right CVA tenderness, left CVA tenderness or guarding.  Musculoskeletal:        General: No swelling.     Right lower leg: No edema.     Left lower leg: No edema.  Skin:    General: Skin is warm and dry.     Capillary Refill: Capillary refill takes less than 2 seconds.     Findings: No rash.  Neurological:     General: No focal deficit present.     Mental Status: He is alert and oriented to person, place, and time.  Psychiatric:        Mood and Affect: Mood normal.     ED Results / Procedures / Treatments   Labs (all labs ordered are listed, but only abnormal results are displayed) Labs Reviewed  BASIC METABOLIC PANEL - Abnormal; Notable for the following components:      Result Value   Glucose, Bld 193 (*)    Creatinine, Ser 1.70 (*)    GFR calc non Af Amer 39 (*)    GFR calc Af Amer 45 (*)    All other components within normal limits  SARS CORONAVIRUS 2 BY RT PCR (HOSPITAL ORDER, PERFORMED IN [facility])  CBC  TROPONIN I (HIGH SENSITIVITY)  TROPONIN I (HIGH SENSITIVITY)    EKG EKG Interpretation  Date/Time:  Thursday December 30 2019 11:51:14 EDT Ventricular Rate:  67 PR Interval:  164 QRS Duration: 146 QT Interval:  420 QTC Calculation: 443 R Axis:   43 Text Interpretation: Normal sinus rhythm Left bundle branch block Abnormal ECG Confirmed by Gerhard Munch [phone]) on  12/30/2019 10:23:32 PM   Radiology DG Chest 2 View  Result Date: 12/30/2019 CLINICAL DATA:  Chest pain. EXAM: CHEST - 2 VIEW COMPARISON:  Prior chest radiographs 09/24/2018 and earlier. FINDINGS: A loop recorder device projects over the left chest. Heart size within normal limits. No appreciable airspace consolidation or pulmonary edema. No evidence of pleural effusion or pneumothorax. No acute bony abnormality identified. IMPRESSION: No evidence of active cardiopulmonary disease. Electronically Signed   By: Jackey Loge DO   On: 12/30/2019 12:24    Procedures Procedures (including critical care time)  Medications Ordered in ED Medications - No data to display  ED Course  I have reviewed the triage vital signs and the nursing notes.  Pertinent labs & imaging results that were available during my care of the patient were reviewed by me and considered in my medical decision making (see chart for details).    MDM Rules/Calculators/A&P                          I have personally reviewed all imaging, labs and have interpreted them.  Patient presents to the emergency department with chief complaint of chest pain and dyspnea on exertion.  Patient was alert and oriented, did not appear to be in acute distress, vital signs reassuring.  On exam no respiratory distress or respiratory failure noted, lung sounds were clear bilaterally, abdomen is nontender to palpation, no pedal edema noted.  Will order screening labs, chest x-ray, EKG for further evaluation.  CBC does not show leukocytosis, no signs of anemia.  BMP showed slight hyperglycemia of 193, slight elevation in creatinine of 1.70 baseline creatinine is 1.3.  Initial troponin was 6, second troponin was 7.  EKG shows sinus rhythm with left bundle branch block and LVH which is unchanged  from previous EKGs.  No signs of ischemia, ST elevation or depression noted.  Patient's heart score is a 5 due to  his increased risk of coronary artery  disease, hypertension, CVA and CKD will consult hospitalist for further recommendation.  Spoke with Dr. Allena Katz hospitalist team he feels with a negative delta troponin, unchanged EKG, no chest pain this patient could be followed outpatient.  He does recommend calling cardiologist for their recommendation.  Spoke with Dr. Aniceto Boss with the cardiologist team she feels since ACS is ruled out, she does not feel he should admitted by the cardiologist team. Will recommend close outpatient follow up with a cardiologist.   I have low suspicion for ACS as patient does not have active chest pain, negative delta troponin, no signs of ischemia noted on EKG.  I have low suspicion for metabolic abnormality as patient's BMP does not show electrolyte abnormality.  Low suspicion for PE as he has a negative Wells criteria.  Low suspicion for abdominal abnormality requiring surgical intervention as patient denies abdominal pain, tolerating p.o., abdomen nontender to palpation.  Low suspicion for systemic infection as patient is nontoxic-appearing, vital signs reassuring, no obvious infection noted on exam.  Chest pain differential diagnosis includes anxiety, GERD, costochondritis, CAD.  Will recommend patient follow-up with cardiology for further evaluation.  Patient is resting comfortably, showing no acute signs stress, vital signs reassuring.  No indication for hospitalist admission.  Patient was discussed with attending who assessed the patient agrees assessment plan.  Patient is given at home care and strict return precautions.  Patient verbalized that he understood agree with said plan. Final Clinical Impression(s) / ED Diagnoses Final diagnoses:  Atypical chest pain    Rx / DC Orders ED Discharge Orders    None       Carroll Sage, PA-C 12/30/19 2345    Gerhard Munch, MD 01/01/20 1318

## 2019-12-31 LAB — SARS CORONAVIRUS 2 BY RT PCR (HOSPITAL ORDER, PERFORMED IN ~~LOC~~ HOSPITAL LAB): SARS Coronavirus 2: NEGATIVE

## 2020-01-24 ENCOUNTER — Ambulatory Visit: Payer: Self-pay | Admitting: Cardiovascular Disease

## 2020-01-24 DIAGNOSIS — I200 Unstable angina: Secondary | ICD-10-CM | POA: Insufficient documentation

## 2020-01-24 MED ORDER — SODIUM CHLORIDE 0.9% FLUSH
3.0000 mL | Freq: Two times a day (BID) | INTRAVENOUS | Status: AC
  Filled 2020-01-24: qty 3

## 2020-01-26 ENCOUNTER — Other Ambulatory Visit
Admission: RE | Admit: 2020-01-26 | Discharge: 2020-01-26 | Disposition: A | Discharge status: Home / Self Care | Payer: Medicare HMO | Source: Ambulatory Visit | Attending: Cardiovascular Disease | Admitting: Cardiovascular Disease

## 2020-01-26 ENCOUNTER — Other Ambulatory Visit: Payer: Self-pay

## 2020-01-26 DIAGNOSIS — Z20822 Contact with and (suspected) exposure to covid-19: Secondary | ICD-10-CM | POA: Insufficient documentation

## 2020-01-26 DIAGNOSIS — Z01812 Encounter for preprocedural laboratory examination: Secondary | ICD-10-CM | POA: Insufficient documentation

## 2020-01-26 LAB — SARS CORONAVIRUS 2 (TAT 6-24 HRS): SARS Coronavirus 2: NEGATIVE

## 2020-01-28 ENCOUNTER — Ambulatory Visit
Admission: RE | Admit: 2020-01-28 | Discharge: 2020-01-28 | Disposition: A | Discharge status: Home / Self Care | Payer: Medicare HMO | Attending: Cardiovascular Disease | Admitting: Cardiovascular Disease

## 2020-01-28 ENCOUNTER — Other Ambulatory Visit: Payer: Self-pay

## 2020-01-28 ENCOUNTER — Encounter
Admission: RE | Disposition: A | Discharge status: Home / Self Care | Payer: Self-pay | Source: Home / Self Care | Attending: Cardiovascular Disease

## 2020-01-28 DIAGNOSIS — I452 Bifascicular block: Secondary | ICD-10-CM | POA: Diagnosis not present

## 2020-01-28 DIAGNOSIS — I200 Unstable angina: Secondary | ICD-10-CM

## 2020-01-28 DIAGNOSIS — R079 Chest pain, unspecified: Secondary | ICD-10-CM | POA: Diagnosis present

## 2020-01-28 DIAGNOSIS — E119 Type 2 diabetes mellitus without complications: Secondary | ICD-10-CM | POA: Diagnosis not present

## 2020-01-28 DIAGNOSIS — E785 Hyperlipidemia, unspecified: Secondary | ICD-10-CM | POA: Diagnosis not present

## 2020-01-28 DIAGNOSIS — Z79899 Other long term (current) drug therapy: Secondary | ICD-10-CM | POA: Insufficient documentation

## 2020-01-28 DIAGNOSIS — I25110 Atherosclerotic heart disease of native coronary artery with unstable angina pectoris: Secondary | ICD-10-CM | POA: Diagnosis not present

## 2020-01-28 DIAGNOSIS — I10 Essential (primary) hypertension: Secondary | ICD-10-CM | POA: Insufficient documentation

## 2020-01-28 DIAGNOSIS — Z7982 Long term (current) use of aspirin: Secondary | ICD-10-CM | POA: Insufficient documentation

## 2020-01-28 HISTORY — PX: LEFT HEART CATH AND CORONARY ANGIOGRAPHY: CATH118249

## 2020-01-28 SURGERY — LEFT HEART CATH AND CORONARY ANGIOGRAPHY
Anesthesia: Moderate Sedation

## 2020-01-28 MED ORDER — SODIUM CHLORIDE 0.9% FLUSH: 3.0000 mL | INTRAVENOUS | Status: DC | PRN

## 2020-01-28 MED ORDER — ASPIRIN 81 MG PO CHEW
CHEWABLE_TABLET | ORAL | Status: AC
  Filled 2020-01-28: qty 1

## 2020-01-28 MED ORDER — ACETAMINOPHEN 325 MG PO TABS: 650.0000 mg | ORAL_TABLET | ORAL | Status: DC | PRN

## 2020-01-28 MED ORDER — MIDAZOLAM HCL 2 MG/2ML IJ SOLN
INTRAMUSCULAR | Status: AC
  Filled 2020-01-28: qty 2

## 2020-01-28 MED ORDER — SODIUM CHLORIDE 0.9 % IV SOLN: 250.0000 mL | INTRAVENOUS | Status: DC | PRN

## 2020-01-28 MED ORDER — ONDANSETRON HCL 4 MG/2ML IJ SOLN: 4.0000 mg | Freq: Four times a day (QID) | INTRAMUSCULAR | Status: DC | PRN

## 2020-01-28 MED ORDER — SODIUM CHLORIDE 0.9 % IV SOLN
250.0000 mL | INTRAVENOUS | Status: DC | PRN
  Administered 2020-01-28: 250 mL via INTRAVENOUS

## 2020-01-28 MED ORDER — SODIUM CHLORIDE 0.9% FLUSH: 3.0000 mL | Freq: Two times a day (BID) | INTRAVENOUS | Status: DC

## 2020-01-28 MED ORDER — SODIUM CHLORIDE 0.9 % WEIGHT BASED INFUSION: 3.0000 mL/kg/h | INTRAVENOUS | Status: DC

## 2020-01-28 MED ORDER — MIDAZOLAM HCL 2 MG/2ML IJ SOLN
INTRAMUSCULAR | Status: DC | PRN
  Administered 2020-01-28: 1 mg via INTRAVENOUS

## 2020-01-28 MED ORDER — HYDRALAZINE HCL 20 MG/ML IJ SOLN: 10.0000 mg | INTRAMUSCULAR | Status: DC | PRN

## 2020-01-28 MED ORDER — SODIUM CHLORIDE 0.9 % IV BOLUS
INTRAVENOUS | Status: AC | PRN
  Administered 2020-01-28: 1000 mL via INTRAVENOUS

## 2020-01-28 MED ORDER — FENTANYL CITRATE (PF) 100 MCG/2ML IJ SOLN
INTRAMUSCULAR | Status: AC
  Filled 2020-01-28: qty 2

## 2020-01-28 MED ORDER — ASPIRIN 81 MG PO CHEW
81.0000 mg | CHEWABLE_TABLET | ORAL | Status: AC
  Administered 2020-01-28: 81 mg via ORAL

## 2020-01-28 MED ORDER — SODIUM CHLORIDE 0.9 % WEIGHT BASED INFUSION: 1.0000 mL/kg/h | INTRAVENOUS | Status: DC

## 2020-01-28 MED ORDER — IOHEXOL 300 MG/ML  SOLN
INTRAMUSCULAR | Status: DC | PRN
  Administered 2020-01-28: 25 mL

## 2020-01-28 MED ORDER — HEPARIN (PORCINE) IN NACL 1000-0.9 UT/500ML-% IV SOLN
INTRAVENOUS | Status: DC | PRN
  Administered 2020-01-28: 500 mL

## 2020-01-28 MED ORDER — FENTANYL CITRATE (PF) 100 MCG/2ML IJ SOLN
INTRAMUSCULAR | Status: DC | PRN
Start: 2020-01-28 — End: 2020-01-28
  Administered 2020-01-28: 25 ug via INTRAVENOUS

## 2020-01-28 MED ORDER — LABETALOL HCL 5 MG/ML IV SOLN: 10.0000 mg | INTRAVENOUS | Status: DC | PRN

## 2020-01-28 SURGICAL SUPPLY — 9 items
CATH INFINITI 5FR JL4 (CATHETERS) ×2 IMPLANT
CATH INFINITI JR4 5F (CATHETERS) ×2 IMPLANT
DEVICE CLOSURE MYNXGRIP 5F (Vascular Products) ×2 IMPLANT
KIT MANI 3VAL PERCEP (MISCELLANEOUS) ×3 IMPLANT
NDL PERC 18GX7CM (NEEDLE) IMPLANT
NEEDLE PERC 18GX7CM (NEEDLE) ×3 IMPLANT
PACK CARDIAC CATH (CUSTOM PROCEDURE TRAY) ×3 IMPLANT
SHEATH AVANTI 5FR X 11CM (SHEATH) ×2 IMPLANT
WIRE GUIDERIGHT .035X150 (WIRE) ×2 IMPLANT

## 2020-01-28 NOTE — Progress Notes (Signed)
Pt given a sandwich tray and something to drink. This RN still unable to reach pt sister. Will try with pt phone.

## 2020-01-28 NOTE — Discharge Instructions (Signed)

## 2020-01-31 ENCOUNTER — Encounter: Payer: Self-pay | Admitting: Cardiovascular Disease

## 2020-04-07 ENCOUNTER — Emergency Department (HOSPITAL_COMMUNITY): Payer: Medicare HMO

## 2020-04-07 ENCOUNTER — Inpatient Hospital Stay (HOSPITAL_COMMUNITY): Payer: Medicare HMO

## 2020-04-07 ENCOUNTER — Encounter (HOSPITAL_COMMUNITY)
Admission: EM | Disposition: A | Discharge status: Skilled Nursing Facility | Payer: Self-pay | Source: Home / Self Care | Attending: Internal Medicine

## 2020-04-07 ENCOUNTER — Inpatient Hospital Stay (HOSPITAL_COMMUNITY)
Admission: EM | Admit: 2020-04-07 | Discharge: 2020-04-20 | DRG: 242 | Disposition: A | Discharge status: Skilled Nursing Facility | Payer: Medicare HMO | Attending: Internal Medicine | Admitting: Internal Medicine

## 2020-04-07 DIAGNOSIS — Z823 Family history of stroke: Secondary | ICD-10-CM

## 2020-04-07 DIAGNOSIS — T796XXA Traumatic ischemia of muscle, initial encounter: Secondary | ICD-10-CM | POA: Diagnosis present

## 2020-04-07 DIAGNOSIS — E785 Hyperlipidemia, unspecified: Secondary | ICD-10-CM | POA: Diagnosis present

## 2020-04-07 DIAGNOSIS — Z8673 Personal history of transient ischemic attack (TIA), and cerebral infarction without residual deficits: Secondary | ICD-10-CM

## 2020-04-07 DIAGNOSIS — E1122 Type 2 diabetes mellitus with diabetic chronic kidney disease: Secondary | ICD-10-CM | POA: Diagnosis present

## 2020-04-07 DIAGNOSIS — E870 Hyperosmolality and hypernatremia: Secondary | ICD-10-CM | POA: Diagnosis not present

## 2020-04-07 DIAGNOSIS — R4182 Altered mental status, unspecified: Secondary | ICD-10-CM

## 2020-04-07 DIAGNOSIS — I459 Conduction disorder, unspecified: Secondary | ICD-10-CM

## 2020-04-07 DIAGNOSIS — I2510 Atherosclerotic heart disease of native coronary artery without angina pectoris: Secondary | ICD-10-CM | POA: Diagnosis present

## 2020-04-07 DIAGNOSIS — E876 Hypokalemia: Secondary | ICD-10-CM | POA: Diagnosis present

## 2020-04-07 DIAGNOSIS — G9341 Metabolic encephalopathy: Secondary | ICD-10-CM | POA: Diagnosis present

## 2020-04-07 DIAGNOSIS — K219 Gastro-esophageal reflux disease without esophagitis: Secondary | ICD-10-CM | POA: Diagnosis present

## 2020-04-07 DIAGNOSIS — E874 Mixed disorder of acid-base balance: Secondary | ICD-10-CM | POA: Diagnosis present

## 2020-04-07 DIAGNOSIS — E1165 Type 2 diabetes mellitus with hyperglycemia: Secondary | ICD-10-CM | POA: Diagnosis not present

## 2020-04-07 DIAGNOSIS — E440 Moderate protein-calorie malnutrition: Secondary | ICD-10-CM | POA: Diagnosis present

## 2020-04-07 DIAGNOSIS — J9621 Acute and chronic respiratory failure with hypoxia: Secondary | ICD-10-CM | POA: Diagnosis present

## 2020-04-07 DIAGNOSIS — I129 Hypertensive chronic kidney disease with stage 1 through stage 4 chronic kidney disease, or unspecified chronic kidney disease: Secondary | ICD-10-CM | POA: Diagnosis present

## 2020-04-07 DIAGNOSIS — Z79899 Other long term (current) drug therapy: Secondary | ICD-10-CM

## 2020-04-07 DIAGNOSIS — R1310 Dysphagia, unspecified: Secondary | ICD-10-CM | POA: Diagnosis present

## 2020-04-07 DIAGNOSIS — N400 Enlarged prostate without lower urinary tract symptoms: Secondary | ICD-10-CM | POA: Diagnosis present

## 2020-04-07 DIAGNOSIS — E7800 Pure hypercholesterolemia, unspecified: Secondary | ICD-10-CM | POA: Diagnosis present

## 2020-04-07 DIAGNOSIS — G9340 Encephalopathy, unspecified: Secondary | ICD-10-CM | POA: Diagnosis not present

## 2020-04-07 DIAGNOSIS — L89619 Pressure ulcer of right heel, unspecified stage: Secondary | ICD-10-CM | POA: Diagnosis not present

## 2020-04-07 DIAGNOSIS — N1832 Chronic kidney disease, stage 3b: Secondary | ICD-10-CM | POA: Diagnosis present

## 2020-04-07 DIAGNOSIS — Z8249 Family history of ischemic heart disease and other diseases of the circulatory system: Secondary | ICD-10-CM

## 2020-04-07 DIAGNOSIS — I639 Cerebral infarction, unspecified: Secondary | ICD-10-CM

## 2020-04-07 DIAGNOSIS — Z7902 Long term (current) use of antithrombotics/antiplatelets: Secondary | ICD-10-CM | POA: Diagnosis not present

## 2020-04-07 DIAGNOSIS — L89629 Pressure ulcer of left heel, unspecified stage: Secondary | ICD-10-CM | POA: Diagnosis not present

## 2020-04-07 DIAGNOSIS — Z20822 Contact with and (suspected) exposure to covid-19: Secondary | ICD-10-CM | POA: Diagnosis present

## 2020-04-07 DIAGNOSIS — E1100 Type 2 diabetes mellitus with hyperosmolarity without nonketotic hyperglycemic-hyperosmolar coma (NKHHC): Secondary | ICD-10-CM | POA: Diagnosis present

## 2020-04-07 DIAGNOSIS — Z978 Presence of other specified devices: Secondary | ICD-10-CM

## 2020-04-07 DIAGNOSIS — I442 Atrioventricular block, complete: Principal | ICD-10-CM

## 2020-04-07 DIAGNOSIS — E860 Dehydration: Secondary | ICD-10-CM | POA: Diagnosis present

## 2020-04-07 DIAGNOSIS — E119 Type 2 diabetes mellitus without complications: Secondary | ICD-10-CM | POA: Diagnosis not present

## 2020-04-07 DIAGNOSIS — N179 Acute kidney failure, unspecified: Secondary | ICD-10-CM

## 2020-04-07 DIAGNOSIS — Z7982 Long term (current) use of aspirin: Secondary | ICD-10-CM

## 2020-04-07 DIAGNOSIS — J9601 Acute respiratory failure with hypoxia: Secondary | ICD-10-CM | POA: Diagnosis not present

## 2020-04-07 DIAGNOSIS — Z87891 Personal history of nicotine dependence: Secondary | ICD-10-CM

## 2020-04-07 DIAGNOSIS — L8990 Pressure ulcer of unspecified site, unspecified stage: Secondary | ICD-10-CM | POA: Insufficient documentation

## 2020-04-07 DIAGNOSIS — Z950 Presence of cardiac pacemaker: Secondary | ICD-10-CM

## 2020-04-07 HISTORY — PX: TEMPORARY PACEMAKER: CATH118268

## 2020-04-07 LAB — DIFFERENTIAL
Abs Immature Granulocytes: 0.1 10*3/uL — ABNORMAL HIGH (ref 0.00–0.07)
Basophils Absolute: 0 10*3/uL (ref 0.0–0.1)
Basophils Relative: 0 %
Eosinophils Absolute: 0 10*3/uL (ref 0.0–0.5)
Eosinophils Relative: 0 %
Immature Granulocytes: 1 %
Lymphocytes Relative: 11 %
Lymphs Abs: 2 10*3/uL (ref 0.7–4.0)
Monocytes Absolute: 1.3 10*3/uL — ABNORMAL HIGH (ref 0.1–1.0)
Monocytes Relative: 7 %
Neutro Abs: 14.3 10*3/uL — ABNORMAL HIGH (ref 1.7–7.7)
Neutrophils Relative %: 81 %

## 2020-04-07 LAB — COMPREHENSIVE METABOLIC PANEL
ALT: 34 U/L (ref 0–44)
AST: 39 U/L (ref 15–41)
Albumin: 3.3 g/dL — ABNORMAL LOW (ref 3.5–5.0)
Alkaline Phosphatase: 59 U/L (ref 38–126)
Anion gap: 17 — ABNORMAL HIGH (ref 5–15)
BUN: 44 mg/dL — ABNORMAL HIGH (ref 8–23)
CO2: 21 mmol/L — ABNORMAL LOW (ref 22–32)
Calcium: 9.9 mg/dL (ref 8.9–10.3)
Chloride: 100 mmol/L (ref 98–111)
Creatinine, Ser: 3.06 mg/dL — ABNORMAL HIGH (ref 0.61–1.24)
GFR, Estimated: 21 mL/min — ABNORMAL LOW (ref >60)
Glucose, Bld: 542 mg/dL (ref 70–99)
Potassium: 5.2 mmol/L — ABNORMAL HIGH (ref 3.5–5.1)
Sodium: 138 mmol/L (ref 135–145)
Total Bilirubin: 1.2 mg/dL (ref 0.3–1.2)
Total Protein: 7.3 g/dL (ref 6.5–8.1)

## 2020-04-07 LAB — I-STAT ARTERIAL BLOOD GAS, ED
Acid-base deficit: 2 mmol/L (ref 0.0–2.0)
Bicarbonate: 19.8 mmol/L — ABNORMAL LOW (ref 20.0–28.0)
Calcium, Ion: 1.26 mmol/L (ref 1.15–1.40)
HCT: 40 % (ref 39.0–52.0)
Hemoglobin: 13.6 g/dL (ref 13.0–17.0)
O2 Saturation: 100 %
Potassium: 4.8 mmol/L (ref 3.5–5.1)
Sodium: 137 mmol/L (ref 135–145)
TCO2: 21 mmol/L — ABNORMAL LOW (ref 22–32)
pCO2 arterial: 26.8 mmHg — ABNORMAL LOW (ref 32.0–48.0)
pH, Arterial: 7.478 — ABNORMAL HIGH (ref 7.350–7.450)
pO2, Arterial: 478 mmHg — ABNORMAL HIGH (ref 83.0–108.0)

## 2020-04-07 LAB — I-STAT VENOUS BLOOD GAS, ED
Acid-base deficit: 3 mmol/L — ABNORMAL HIGH (ref 0.0–2.0)
Bicarbonate: 22.2 mmol/L (ref 20.0–28.0)
Calcium, Ion: 1.18 mmol/L (ref 1.15–1.40)
HCT: 44 % (ref 39.0–52.0)
Hemoglobin: 15 g/dL (ref 13.0–17.0)
O2 Saturation: 57 %
Potassium: 5.2 mmol/L — ABNORMAL HIGH (ref 3.5–5.1)
Sodium: 137 mmol/L (ref 135–145)
TCO2: 23 mmol/L (ref 22–32)
pCO2, Ven: 38 mmHg — ABNORMAL LOW (ref 44.0–60.0)
pH, Ven: 7.374 (ref 7.250–7.430)
pO2, Ven: 31 mmHg — CL (ref 32.0–45.0)

## 2020-04-07 LAB — ECHOCARDIOGRAM COMPLETE
Height: 71 in
S' Lateral: 2.25 cm
Weight: 2673.74 oz

## 2020-04-07 LAB — CBC
HCT: 47.3 % (ref 39.0–52.0)
HCT: 41.1 % (ref 39.0–52.0)
Hemoglobin: 14.7 g/dL (ref 13.0–17.0)
Hemoglobin: 13.2 g/dL (ref 13.0–17.0)
MCH: 28.9 pg (ref 26.0–34.0)
MCH: 28.8 pg (ref 26.0–34.0)
MCHC: 31.1 g/dL (ref 30.0–36.0)
MCHC: 32.1 g/dL (ref 30.0–36.0)
MCV: 92.9 fL (ref 80.0–100.0)
MCV: 89.7 fL (ref 80.0–100.0)
Platelets: 263 10*3/uL (ref 150–400)
Platelets: 235 10*3/uL (ref 150–400)
RBC: 5.09 MIL/uL (ref 4.22–5.81)
RBC: 4.58 MIL/uL (ref 4.22–5.81)
RDW: 13.2 % (ref 11.5–15.5)
RDW: 13.3 % (ref 11.5–15.5)
WBC: 17.7 10*3/uL — ABNORMAL HIGH (ref 4.0–10.5)
WBC: 16.1 10*3/uL — ABNORMAL HIGH (ref 4.0–10.5)
nRBC: 0 % (ref 0.0–0.2)
nRBC: 0 % (ref 0.0–0.2)

## 2020-04-07 LAB — URINALYSIS, ROUTINE W REFLEX MICROSCOPIC
Bilirubin Urine: NEGATIVE
Glucose, UA: >=500 mg/dL — AB
Ketones, ur: 20 mg/dL — AB
Leukocytes,Ua: NEGATIVE
Nitrite: NEGATIVE
Protein, ur: 100 mg/dL — AB
Specific Gravity, Urine: 1.028 (ref 1.005–1.030)
pH: 5 (ref 5.0–8.0)

## 2020-04-07 LAB — POCT I-STAT 7, (LYTES, BLD GAS, ICA,H+H)
Acid-Base Excess: 4 mmol/L — ABNORMAL HIGH (ref 0.0–2.0)
Bicarbonate: 25.7 mmol/L (ref 20.0–28.0)
Calcium, Ion: 1.28 mmol/L (ref 1.15–1.40)
HCT: 36 % — ABNORMAL LOW (ref 39.0–52.0)
Hemoglobin: 12.2 g/dL — ABNORMAL LOW (ref 13.0–17.0)
O2 Saturation: 100 %
Patient temperature: 99.2
Potassium: 3.7 mmol/L (ref 3.5–5.1)
Sodium: 144 mmol/L (ref 135–145)
TCO2: 27 mmol/L (ref 22–32)
pCO2 arterial: 29 mmHg — ABNORMAL LOW (ref 32.0–48.0)
pH, Arterial: 7.556 — ABNORMAL HIGH (ref 7.350–7.450)
pO2, Arterial: 170 mmHg — ABNORMAL HIGH (ref 83.0–108.0)

## 2020-04-07 LAB — RAPID URINE DRUG SCREEN, HOSP PERFORMED
Amphetamines: NOT DETECTED
Barbiturates: NOT DETECTED
Benzodiazepines: NOT DETECTED
Cocaine: NOT DETECTED
Opiates: NOT DETECTED
Tetrahydrocannabinol: NOT DETECTED

## 2020-04-07 LAB — BASIC METABOLIC PANEL
Anion gap: 13 (ref 5–15)
BUN: 41 mg/dL — ABNORMAL HIGH (ref 8–23)
CO2: 24 mmol/L (ref 22–32)
Calcium: 9.6 mg/dL (ref 8.9–10.3)
Chloride: 110 mmol/L (ref 98–111)
Creatinine, Ser: 2.19 mg/dL — ABNORMAL HIGH (ref 0.61–1.24)
GFR, Estimated: 31 mL/min — ABNORMAL LOW (ref >60)
Glucose, Bld: 113 mg/dL — ABNORMAL HIGH (ref 70–99)
Potassium: 3.8 mmol/L (ref 3.5–5.1)
Sodium: 147 mmol/L — ABNORMAL HIGH (ref 135–145)

## 2020-04-07 LAB — I-STAT CHEM 8, ED
BUN: 47 mg/dL — ABNORMAL HIGH (ref 8–23)
Calcium, Ion: 1.19 mmol/L (ref 1.15–1.40)
Chloride: 105 mmol/L (ref 98–111)
Creatinine, Ser: 2.7 mg/dL — ABNORMAL HIGH (ref 0.61–1.24)
Glucose, Bld: 528 mg/dL (ref 70–99)
HCT: 46 % (ref 39.0–52.0)
Hemoglobin: 15.6 g/dL (ref 13.0–17.0)
Potassium: 5 mmol/L (ref 3.5–5.1)
Sodium: 138 mmol/L (ref 135–145)
TCO2: 22 mmol/L (ref 22–32)

## 2020-04-07 LAB — CBG MONITORING, ED
Glucose-Capillary: 458 mg/dL — ABNORMAL HIGH (ref 70–99)
Glucose-Capillary: 500 mg/dL — ABNORMAL HIGH (ref 70–99)
Glucose-Capillary: 440 mg/dL — ABNORMAL HIGH (ref 70–99)
Glucose-Capillary: 422 mg/dL — ABNORMAL HIGH (ref 70–99)
Glucose-Capillary: 351 mg/dL — ABNORMAL HIGH (ref 70–99)

## 2020-04-07 LAB — PROTIME-INR
INR: 1.2 (ref 0.8–1.2)
Prothrombin Time: 14.8 seconds (ref 11.4–15.2)

## 2020-04-07 LAB — RESP PANEL BY RT-PCR (FLU A&B, COVID) ARPGX2
Influenza A by PCR: NEGATIVE
Influenza B by PCR: NEGATIVE
SARS Coronavirus 2 by RT PCR: NEGATIVE

## 2020-04-07 LAB — GLUCOSE, CAPILLARY
Glucose-Capillary: 284 mg/dL — ABNORMAL HIGH (ref 70–99)
Glucose-Capillary: 282 mg/dL — ABNORMAL HIGH (ref 70–99)
Glucose-Capillary: 145 mg/dL — ABNORMAL HIGH (ref 70–99)
Glucose-Capillary: 151 mg/dL — ABNORMAL HIGH (ref 70–99)
Glucose-Capillary: 169 mg/dL — ABNORMAL HIGH (ref 70–99)

## 2020-04-07 LAB — CK: Total CK: 3471 U/L — ABNORMAL HIGH (ref 49–397)

## 2020-04-07 LAB — HEMOGLOBIN A1C
Hgb A1c MFr Bld: 11.9 % — ABNORMAL HIGH (ref 4.8–5.6)
Mean Plasma Glucose: 294.83 mg/dL

## 2020-04-07 LAB — AMMONIA: Ammonia: 28 umol/L (ref 9–35)

## 2020-04-07 LAB — APTT: aPTT: 27 seconds (ref 24–36)

## 2020-04-07 LAB — OSMOLALITY: Osmolality: 343 mOsm/kg (ref 275–295)

## 2020-04-07 SURGERY — TEMPORARY PACEMAKER
Anesthesia: LOCAL

## 2020-04-07 MED ORDER — INSULIN REGULAR(HUMAN) IN NACL 100-0.9 UT/100ML-% IV SOLN
INTRAVENOUS | Status: DC
  Administered 2020-04-07: 12:00:00 4.2 [IU]/h via INTRAVENOUS
  Administered 2020-04-08: 18:00:00 2 [IU]/h via INTRAVENOUS
  Filled 2020-04-07 (×2): qty 100

## 2020-04-07 MED ORDER — ROCURONIUM BROMIDE 10 MG/ML (PF) SYRINGE
PREFILLED_SYRINGE | INTRAVENOUS | Status: AC
  Filled 2020-04-07: qty 10

## 2020-04-07 MED ORDER — LACTATED RINGERS IV SOLN: INTRAVENOUS | Status: DC

## 2020-04-07 MED ORDER — HEPARIN (PORCINE) IN NACL 1000-0.9 UT/500ML-% IV SOLN
INTRAVENOUS | Status: DC | PRN
  Administered 2020-04-07: 500 mL

## 2020-04-07 MED ORDER — ASPIRIN 81 MG PO CHEW
81.0000 mg | CHEWABLE_TABLET | Freq: Every day | ORAL | Status: DC
  Administered 2020-04-08 – 2020-04-12 (×5): 81 mg
  Filled 2020-04-07 (×5): qty 1

## 2020-04-07 MED ORDER — DEXTROSE IN LACTATED RINGERS 5 % IV SOLN: INTRAVENOUS | Status: DC

## 2020-04-07 MED ORDER — DEXMEDETOMIDINE HCL IN NACL 400 MCG/100ML IV SOLN
0.0000 ug/kg/h | INTRAVENOUS | Status: DC
  Filled 2020-04-07: qty 100

## 2020-04-07 MED ORDER — POTASSIUM CHLORIDE 10 MEQ/100ML IV SOLN: 10.0000 meq | INTRAVENOUS | Status: AC

## 2020-04-07 MED ORDER — INSULIN REGULAR(HUMAN) IN NACL 100-0.9 UT/100ML-% IV SOLN: INTRAVENOUS | Status: DC

## 2020-04-07 MED ORDER — LIDOCAINE HCL (PF) 1 % IJ SOLN
INTRAMUSCULAR | Status: AC
  Filled 2020-04-07: qty 30

## 2020-04-07 MED ORDER — CHLORHEXIDINE GLUCONATE CLOTH 2 % EX PADS: 6.0000 | MEDICATED_PAD | Freq: Every day | CUTANEOUS | Status: DC

## 2020-04-07 MED ORDER — PROPOFOL 1000 MG/100ML IV EMUL
5.0000 ug/kg/min | INTRAVENOUS | Status: DC
  Administered 2020-04-07: 16:00:00 5 ug/kg/min via INTRAVENOUS
  Administered 2020-04-08: 19:00:00 40 ug/kg/min via INTRAVENOUS
  Administered 2020-04-08: 04:00:00 30 ug/kg/min via INTRAVENOUS
  Administered 2020-04-08: 12:00:00 20 ug/kg/min via INTRAVENOUS
  Administered 2020-04-08 – 2020-04-09 (×2): 38 ug/kg/min via INTRAVENOUS
  Administered 2020-04-09: 35 ug/kg/min via INTRAVENOUS
  Administered 2020-04-09 (×2): 30 ug/kg/min via INTRAVENOUS
  Administered 2020-04-10: 04:00:00 38 ug/kg/min via INTRAVENOUS
  Administered 2020-04-10: 10:00:00 30 ug/kg/min via INTRAVENOUS
  Filled 2020-04-07 (×11): qty 100
  Filled 2020-04-07: qty 200

## 2020-04-07 MED ORDER — LACTATED RINGERS IV BOLUS
20.0000 mL/kg | Freq: Once | INTRAVENOUS | Status: AC
  Administered 2020-04-07: 13:00:00 1516 mL via INTRAVENOUS

## 2020-04-07 MED ORDER — CHLORHEXIDINE GLUCONATE 0.12% ORAL RINSE (MEDLINE KIT)
15.0000 mL | Freq: Two times a day (BID) | OROMUCOSAL | Status: DC
  Administered 2020-04-07 – 2020-04-11 (×7): 15 mL via OROMUCOSAL

## 2020-04-07 MED ORDER — DOCUSATE SODIUM 50 MG/5ML PO LIQD
100.0000 mg | Freq: Two times a day (BID) | ORAL | Status: DC
  Administered 2020-04-07 – 2020-04-12 (×9): 100 mg
  Filled 2020-04-07 (×9): qty 10

## 2020-04-07 MED ORDER — SODIUM CHLORIDE 0.9 % IV BOLUS
500.0000 mL | Freq: Once | INTRAVENOUS | Status: AC
  Administered 2020-04-07: 12:00:00 500 mL via INTRAVENOUS

## 2020-04-07 MED ORDER — FENTANYL 2500MCG IN NS 250ML (10MCG/ML) PREMIX INFUSION
0.0000 ug/h | INTRAVENOUS | Status: DC
  Administered 2020-04-07: 10:00:00 100 ug/h via INTRAVENOUS
  Filled 2020-04-07: qty 250

## 2020-04-07 MED ORDER — FENTANYL CITRATE (PF) 100 MCG/2ML IJ SOLN
25.0000 ug | Freq: Once | INTRAMUSCULAR | Status: AC
  Administered 2020-04-07: 11:00:00 25 ug via INTRAVENOUS

## 2020-04-07 MED ORDER — FENTANYL BOLUS VIA INFUSION
25.0000 ug | INTRAVENOUS | Status: DC | PRN
  Filled 2020-04-07: qty 25

## 2020-04-07 MED ORDER — ETOMIDATE 2 MG/ML IV SOLN
INTRAVENOUS | Status: AC | PRN
Start: 2020-04-07 — End: 2020-04-07
  Administered 2020-04-07: 20 mg via INTRAVENOUS

## 2020-04-07 MED ORDER — SUCCINYLCHOLINE CHLORIDE 200 MG/10ML IV SOSY
PREFILLED_SYRINGE | INTRAVENOUS | Status: AC
  Filled 2020-04-07: qty 10

## 2020-04-07 MED ORDER — ORAL CARE MOUTH RINSE
15.0000 mL | OROMUCOSAL | Status: DC
  Administered 2020-04-07 – 2020-04-11 (×38): 15 mL via OROMUCOSAL

## 2020-04-07 MED ORDER — PERFLUTREN LIPID MICROSPHERE
1.0000 mL | INTRAVENOUS | Status: AC | PRN
  Administered 2020-04-07: 13:00:00 5 mL via INTRAVENOUS
  Filled 2020-04-07: qty 10

## 2020-04-07 MED ORDER — POLYETHYLENE GLYCOL 3350 17 G PO PACK
17.0000 g | PACK | Freq: Every day | ORAL | Status: DC
  Administered 2020-04-08 – 2020-04-12 (×5): 17 g
  Filled 2020-04-07 (×5): qty 1

## 2020-04-07 MED ORDER — DEXTROSE 50 % IV SOLN
0.0000 mL | INTRAVENOUS | Status: DC | PRN
  Filled 2020-04-07: qty 50

## 2020-04-07 MED ORDER — SODIUM CHLORIDE 0.9 % IV SOLN: 100.0000 mL/h | INTRAVENOUS | Status: DC

## 2020-04-07 MED ORDER — CHLORHEXIDINE GLUCONATE CLOTH 2 % EX PADS
6.0000 | MEDICATED_PAD | Freq: Every day | CUTANEOUS | Status: DC
  Administered 2020-04-08 – 2020-04-16 (×8): 6 via TOPICAL

## 2020-04-07 MED ORDER — ROSUVASTATIN CALCIUM 20 MG PO TABS
20.0000 mg | ORAL_TABLET | Freq: Every day | ORAL | Status: DC
  Administered 2020-04-08 – 2020-04-12 (×5): 20 mg
  Filled 2020-04-07 (×6): qty 1

## 2020-04-07 MED ORDER — DEXTROSE 50 % IV SOLN: 0.0000 mL | INTRAVENOUS | Status: DC | PRN

## 2020-04-07 MED ORDER — ROCURONIUM BROMIDE 50 MG/5ML IV SOLN
INTRAVENOUS | Status: AC | PRN
  Administered 2020-04-07: 80 mg via INTRAVENOUS

## 2020-04-07 MED ORDER — ETOMIDATE 2 MG/ML IV SOLN
INTRAVENOUS | Status: AC
  Filled 2020-04-07: qty 20

## 2020-04-07 MED ORDER — HEPARIN SODIUM (PORCINE) 5000 UNIT/ML IJ SOLN
5000.0000 [IU] | Freq: Three times a day (TID) | INTRAMUSCULAR | Status: DC
  Administered 2020-04-07 – 2020-04-11 (×11): 5000 [IU] via SUBCUTANEOUS
  Filled 2020-04-07 (×11): qty 1

## 2020-04-07 MED ORDER — LIDOCAINE HCL (PF) 1 % IJ SOLN
INTRAMUSCULAR | Status: DC | PRN
  Administered 2020-04-07: 5 mL

## 2020-04-07 MED FILL — Perflutren Lipid Microsphere IV Susp 1.1 MG/ML: INTRAVENOUS | Qty: 10 | Status: AC

## 2020-04-07 SURGICAL SUPPLY — 6 items
CABLE ADAPT PACING TEMP 12FT (ADAPTER) ×1 IMPLANT
CATH S G BIP PACING (CATHETERS) ×1 IMPLANT
KIT MICROPUNCTURE NIT STIFF (SHEATH) ×1 IMPLANT
SHEATH PINNACLE 6F 10CM (SHEATH) ×1 IMPLANT
SHEATH PROBE COVER 6X72 (BAG) ×1 IMPLANT
SLEEVE REPOSITIONING LENGTH 30 (MISCELLANEOUS) ×1 IMPLANT

## 2020-04-07 NOTE — H&P (Addendum)
NAME:  Jordan [name], MRN:  [id number], DOB:  May 10, 1945, LOS: 0 ADMISSION DATE:  04/07/2020, CONSULTATION DATE:  04/07/20 REFERRING MD:  Particia NearingHaviland, CHIEF COMPLAINT:  stroke   Brief History:  Mr.Jordan [name] is a 74 yo M w/ PMH of Dm, HTN, CKD, CAD [facility]. Found to be altered. Intubated for altered mental status  History of Present Illness:  Mr.Jordan [name] is a 74 yo M w/ PMH listed below [facility] being found down at home. On arrival he was noted to be altered, minimally responsive with inability to follow commands. He is unable to provide history.  History obtained via chart review. EMS pushed emergency call from home around midnight. Patient was unable to be accessed by EMS. Police was called and he was found him at home down for unknown duration. He was brought in as code stroke due to left neglect but was found to be bradycardic with HR in 30s. He was given atropine, cardiology was consulted and intubated. He was also found to be in HHS with cbg >500. Insulin gtt started and PCCM consulted for admission.  Past Medical History:   Past Medical History:  Diagnosis Date  . BPH (benign prostatic hyperplasia)   . CVA (cerebral vascular accident) (HCC) 10/23/2017   CT confirms subacute R parietal infarct  . GERD (gastroesophageal reflux disease)   . High cholesterol   . Hypertension    Significant Hospital Events:  04/07/20 Present to [facility]  Consults:  Neurology, Cardiology  Procedures:  N/A  Significant Diagnostic Tests:  04/07/20 CT CERVICAL SPINE WO CONTRAST IMPRESSION: Negative for acute fracture or malalignment of the cervical spine. Degenerative changes as above.  04/07/20 CT Head IMPRESSION: No acute intracranial hemorrhage or evidence of acute infarction. Multiple chronic infarcts.  Chronic microvascular ischemic changes.  04/07/20 MR Brain IMPRESSION: No acute infarction. No proximal intracranial vessel occlusion.  Micro  Data:  12/17 COVID, FLU neg  Antimicrobials:  N/A   Interim History / Subjective:  Mr.Jordan [name] was examined and evaluated at bedside. He is intubated and unable to provide history. Noted to be agitated during foley insertion but otherwise well-sedated.  Objective   Blood pressure (!) 187/55, pulse (!) 35, resp. rate 18, height 5\' 11"  (1.803 m), weight 75.8 kg, SpO2 100 %.    Vent Mode: PRVC FiO2 (%):  [70 %-100 %] 70 % Set Rate:  [12 bmp-18 bmp] 12 bmp Vt Set:  [560 mL-600 mL] 600 mL PEEP:  [5 cmH20] 5 cmH20 Plateau Pressure:  [15 cmH20-16 cmH20] 16 cmH20  No intake or output data in the 24 hours ending 04/07/20 1136 Filed Weights   04/07/20 0900  Weight: 75.8 kg    Examination:  Gen: Well-developed, chronically ill-appearing, agitated HEENT: NCAT head, EOMI, poor dentition, intubated Neck: No jvd CV: Bradycardic, regular rhythm, S1, S2 normal, No rubs, no murmurs, no gallops Pulm: CTAB, No rales, no wheezes Abd: Soft, BS+, NTND, No rebound, no guarding Extm: ROM intact, Peripheral pulses intact, No peripheral edema Skin: Dry, Warm, poor turgor Neuro: Awaken and withdraw from painful stimuli  Resolved Hospital Problem list     Assessment & Plan:   #HHS Last known hgb a1c in pre-diabetes range at 6.4. Not on diabetic meds at home. Admit cbg 540. Gap of 20. Elevated serum osm at 343 with no acidosis (pH 7.37) Consistent with HHS. Started on insulin gtt in ED. 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 Cardiology on  board. Pacers placed. Given atropine in ED.  - Plan for temporary pacemaker placement per cardiology - Telemetry - Avoid beta-blockers or other rate reducing meds  #Acute encephalopathy due to above Likely multi-factorial due to HHS + symptomatic bradycardia. Intubated and sedated with fentanyl gtt. ABG showing no evidence of respiratory distress. Mild resp alkalosis due to tachypnea - C/w mechanical ventilation - VAp protocol - Wean off sedation as allowed - SBT trials  #Acute Kidney Injury on Chronic Kidney Disease Due to dehydration. Admit creatinine 3.06. Baseline 1.3-1.4 - Fluid resuscitation as above - Trend renal fx - Avoid nephrotoxic meds when able  #CAD Cath in 01/2020 with non-obstructive cad - Resume home meds when NG placed  Best practice (evaluated daily)  Diet: NPO Pain/Anxiety/Delirium protocol (if indicated): fentanyl, precedex VAP protocol (if indicated): Y DVT prophylaxis: subqhep GI prophylaxis: PPI Glucose control: SSI Mobility: BR Disposition:ICU  Goals of Care:  Last date of multidisciplinary goals of care discussion: N/A Family and staff present: N/A Summary of discussion: N/A Follow up goals of care discussion due: 04/08/20 Code Status: FUll  Labs   CBC: Recent Labs  Lab 04/07/20 0915 04/07/20 0922 04/07/20 0952 04/07/20 1116  WBC 17.7*  --   --   --   NEUTROABS 14.3*  --   --   --   HGB 14.7 15.6 15.0 13.6  HCT 47.3 46.0 44.0 40.0  MCV 92.9  --   --   --   PLT 263  --   --   --     Basic Metabolic Panel: Recent Labs  Lab 04/07/20 0915 04/07/20 0922 04/07/20 0952 04/07/20 1116  NA 138 138 137 137  K 5.2* 5.0 5.2* 4.8  CL 100 105  --   --   CO2 21*  --   --   --   GLUCOSE 542* 528*  --   --   BUN 44* 47*  --   --   CREATININE 3.06* 2.70*  --   --   CALCIUM 9.9  --   --   --    GFR: Estimated Creatinine Clearance: 25.6 mL/min (A) (by C-G formula based on SCr of 2.7 mg/dL (H)). Recent Labs  Lab 04/07/20 0915  WBC 17.7*     Liver Function Tests: Recent Labs  Lab 04/07/20 0915  AST 39  ALT 34  ALKPHOS 59  BILITOT 1.2  PROT 7.3  ALBUMIN 3.3*   No results for input(s): LIPASE, AMYLASE in the last 168 hours. No results for input(s): AMMONIA in the last 168 hours.  ABG    Component Value Date/Time   PHART 7.478 (H) 04/07/2020 1116   PCO2ART 26.8 (L) 04/07/2020 1116   PO2ART 478 (H) 04/07/2020 1116   HCO3 19.8 (L) 04/07/2020 1116   TCO2 21 (L) 04/07/2020 1116   ACIDBASEDEF 2.0 04/07/2020 1116   O2SAT 100.0 04/07/2020 1116     Coagulation Profile: Recent Labs  Lab 04/07/20 0947  INR  1.2    Cardiac Enzymes: Recent Labs  Lab 04/07/20 0915  CKTOTAL 3,471*    HbA1C: Hgb A1c MFr Bld  Date/Time Value Ref Range Status  10/24/2017 03:57 AM 6.4 (H) 4.8 - 5.6 % Final    Comment:    (NOTE) Pre diabetes:          5.7%-6.4% Diabetes:              >6.4% Glycemic control for   <7.0% adults with diabetes     CBG: Recent Labs  Lab 04/07/20 0910 04/07/20 1131  GLUCAP 458* 500*    Review of Systems:   Unable to assess  Past Medical History:  He,  has a past medical history of BPH (benign prostatic hyperplasia), CVA (cerebral vascular accident) (HCC) (10/23/2017), GERD (gastroesophageal reflux disease), High cholesterol, and Hypertension.   Surgical History:   Past Surgical History:  Procedure Laterality Date  . LEFT HEART CATH AND CORONARY ANGIOGRAPHY N/A 01/28/2020   Procedure: LEFT HEART CATH AND CORONARY ANGIOGRAPHY;  Surgeon: Laurier Nancy, MD;  Location: [facility] INVASIVE CV LAB;  Service: Cardiovascular;  Laterality: N/A;  . LOOP RECORDER INSERTION N/A 10/27/2017   Procedure: LOOP RECORDER INSERTION;  Surgeon: Marinus Maw, MD;  Location: [facility] INVASIVE CV LAB;  Service: Cardiovascular;  Laterality: N/A;  . NO PAST SURGERIES    . TEE WITHOUT CARDIOVERSION N/A 10/27/2017   Procedure: TRANSESOPHAGEAL ECHOCARDIOGRAM (TEE);  Surgeon: Pricilla Riffle, MD;  Location: [facility] ENDOSCOPY;   Service: Cardiovascular;  Laterality: N/A;     Social History:   reports that he quit smoking about 31 years ago. His smoking use included cigarettes and pipe. He has a 1.20 pack-year smoking history. He has never used smokeless tobacco. He reports that he does not drink alcohol and does not use drugs.   Family History:  His family history includes CVA in his father; CVA (age of onset: 57) in his mother; Heart attack in his father; Stroke in his father and mother.   Allergies No Known Allergies   Home Medications  Prior to Admission medications   Medication Sig Start Date End Date Taking? Authorizing Provider  acetaminophen (TYLENOL) 500 MG tablet Take 1,000 mg by mouth daily as needed for headache (pain).    [provider]  Apple Cid Vn-Grn Tea-Bit Or-Cr (APPLE CIDER VINEGAR PLUS) TABS Take 1-2 tablets by mouth daily.    [provider]  Ascorbic Acid (VITAMIN C) 1000 MG tablet Take 1,000 mg by mouth daily.    [provider]  aspirin 81 MG chewable tablet Chew 81 mg by mouth daily.     [provider]  Cholecalciferol (VITAMIN D3) 1.25 MG (50000 UT) CAPS Take 50,000 Units by mouth every Saturday.    [provider]  clopidogrel (PLAVIX) 75 MG tablet Take 1 tablet (75 mg total) by mouth daily. 10/29/17   Bloomfield, Carley D, DO  GARLIC PO Take 1 tablet by mouth daily.    [provider]  losartan (COZAAR) 100 MG tablet Take 100 mg by mouth daily.    [provider]  Multiple Vitamin (MULTIVITAMIN WITH MINERALS) TABS tablet Take 1 tablet by mouth daily.    [provider]  nitroGLYCERIN (NITROSTAT) 0.4 MG SL tablet Place 1 tablet (0.4 mg total) under the tongue every 5 (five) minutes as needed for chest pain. Patient not taking: Reported on 01/28/2020 03/09/18   Synetta Fail, MD  Omega-3 Fatty Acids (FISH OIL) 1200 MG CAPS Take 1,200  mg by mouth daily.     [provider]  Red Yeast Rice 600 MG TABS  Take 600 mg by mouth daily.    [provider]  rosuvastatin (CRESTOR) 20 MG tablet Take 20 mg by mouth daily.    [provider]  senna (SENOKOT) 8.6 MG TABS tablet Take 1 tablet by mouth daily as needed for mild constipation.    [provider]  sertraline (ZOLOFT) 50 MG tablet Take 1 tablet (50 mg total) by mouth daily. 10/28/17   Bloomfield, Carley D, DO  triamterene-hydrochlorothiazide (MAXZIDE-25) 37.5-25 MG tablet Take 1 tablet by mouth daily. 02/21/19   Frederica Kuster, MD   Theotis Barrio, MD 04/07/2020, 12:07 PM PGY-3, [facility] Internal Medicine Pager: [phone]  Critical care time:

## 2020-04-07 NOTE — ED Notes (Signed)
1 amp atropine given per Dr. Particia Nearing. HR 30

## 2020-04-07 NOTE — Code Documentation (Addendum)
Stroke Response Nurse Documentation Code Documentation  Jordan [name] is a 74 y.o. male arriving to [facility] ED via [facility] on 04/07/2020 with past medical hx of HTN, high cholesterol, CVA. Code stroke was activated by ED after patient arrival. Patient from home where he was LKW at 1700 yesterday when he talked to his family on the phone. Family reports around midnight that the patient puled his emergency line and the EMS was unable to get into his building. Around 0800, family was able to get in and reported that he was found down unresponsive, not following commands.   EMS transported to the ED. Upon arrival, the EDP evaluated and activated a Code Stroke. Pt was noted to have elevated blood sugar along with BUN and Creatine. Stroke Team met patient in room and EDP Haviland cleared airway and patient taken directly to CT. CT Head completed and showed no acute stroke. Not a candidate for tPA due to being outside the window.   NIHSS 20 - Pt not tracking with eyes or blink to threat. He opens eyes to voice, and will follow commands. Noted to have left arm and leg weakness along with right leg weakness, aphasia and dysarthric.     In CT, Pt was noted to be bradycardic with complete heart block and HR in 30s. BP stable. Provider felt patient needed to be stabilized prior to CTA or further imaging. Returned to ED - intubated and Cardiology consulted.   Decision made to take patient for emergent MRI/MRA to ruleout basilar occlusion prior to having pacing wires placed.   1020 - Patient transferred to MRI. Completed and no stroke per MD Bhagat. Returned back to ED to wait for bed placement and temporary pacer wires to be placed.   Handoff given to Liz, RN.    Bell, Hannah S  Stroke Response RN    

## 2020-04-07 NOTE — ED Triage Notes (Signed)
Pt arrives via EMS from home. Per EMS pt pushed emergency call from home around midnight to 0100. EMS unable to get to pt. Unknown downtown. Pt neglecting left side . Pt has stroke history 151/62 59 HR 94%   RA

## 2020-04-07 NOTE — ED Notes (Signed)
Pt back in room.  VS 161/51 HR 67 97%.  RR 11  Pt less responsive. Dr. Particia Nearing to intubate

## 2020-04-07 NOTE — ED Notes (Signed)
Returned from CT.

## 2020-04-07 NOTE — Procedures (Signed)
Patient Name: Jordan [name]  MRN: [id number]  Epilepsy Attending: Charlsie Quest  Referring Physician/Provider: Dr Brooke Dare Date: 04/07/2020 Duration: 23.43 mins  Patient history: 74yo M with ams. EEG to evaluate for seizure.  Level of alertness:  comatose  AEDs during EEG study: Propofol  Technical aspects: This EEG study was done with scalp electrodes positioned according to the 10-20 International system of electrode placement. Electrical activity was acquired at a sampling rate of 500Hz  and reviewed with a high frequency filter of 70Hz  and a low frequency filter of 1Hz . EEG data were recorded continuously and digitally stored.   Description: EEG showed continuous generalized 3 to 6 Hz theta-delta slowing. Hyperventilation and photic stimulation were not performed.     ABNORMALITY - Continuous slow, generalized  IMPRESSION: This study is suggestive of moderate to severe diffuse encephalopathy. No seizures or epileptiform discharges were seen throughout the recording.  Shakena Callari 

## 2020-04-07 NOTE — Progress Notes (Signed)
Patient transported to MRI. RN at bedside.

## 2020-04-07 NOTE — Progress Notes (Signed)
  Echocardiogram 2D Echocardiogram has been performed with Definity.  Gerda Diss 04/07/2020, 1:30 PM

## 2020-04-07 NOTE — ED Notes (Signed)
Pacing started.

## 2020-04-07 NOTE — Progress Notes (Addendum)
Inpatient Diabetes Program Recommendations  AACE/ADA: New Consensus Statement on Inpatient Glycemic Control (2015)  Target Ranges:  Prepandial:   less than 140 mg/dL      Peak postprandial:   less than 180 mg/dL (1-2 hours)      Critically ill patients:  140 - 180 mg/dL  Results for JAYSHUN, GALENTINE (MRN [id number]) as of 04/07/2020 13:15  Ref. Range 04/07/2020 09:15  COMPREHENSIVE METABOLIC PANEL Unknown Rpt (A)  Sodium Latest Ref Range: 135 - 145 mmol/L 138  Potassium Latest Ref Range: 3.5 - 5.1 mmol/L 5.2 (H)  Chloride Latest Ref Range: 98 - 111 mmol/L 100  CO2 Latest Ref Range: 22 - 32 mmol/L 21 (L)  Glucose Latest Ref Range: 70 - 99 mg/dL 096 (HH)  BUN Latest Ref Range: 8 - 23 mg/dL 44 (H)  Creatinine Latest Ref Range: 0.61 - 1.24 mg/dL 2.83 (H)  Calcium Latest Ref Range: 8.9 - 10.3 mg/dL 9.9  Anion gap Latest Ref Range: 5 - 15  17 (H)   Results for MAURICIO, DAHLEN (MRN [id number]) as of 04/07/2020 13:15  Ref. Range 04/07/2020 09:10 04/07/2020 11:31  Glucose-Capillary Latest Ref Range: 70 - 99 mg/dL 650 (H) 354 (H)  IV Insulin Drip Started    Admit with: HHS/ Complete Heart Block  No Previous History of Diabetes noted  Current Orders: IV Insulin Drip    Intubated in the ED  IV Insulin Drip started around 11am today.  Hemoglobin A1c level pending  Will follow   --Will follow patient during hospitalization--  Ambrose Finland RN, MSN, CDE Diabetes Coordinator Inpatient Glycemic Control Team Team Pager: [phone] (8a-5p)

## 2020-04-07 NOTE — Progress Notes (Signed)
RT note. Called elink to inform about current ABG.

## 2020-04-07 NOTE — Consult Note (Signed)
Neurology Consultation Reason for Consult: Code stroke Requesting Physician: Jacalyn Lefevre  CC: Left-sided weakness and right gaze preference  History is obtained from: ED provider and chart review  HPI: Jordan [name] is a 74 y.o. male with a past medical history significant for right frontoparietal stroke (cryptogenic embolic, minimal residual deficit), hypertension, hyperlipidemia, BPH  Per ED providers he lives independently but in some sort of complex.  Reportedly he activated an assistance alarm around midnight or 1 AM but EMS providers were unable to obtain access to his residence.  The Police Department eventually was able to obtain access to his residence around 6 or 7 AM at which time he was found down.  Chart review per last note from Dr. Jerrell Belfast who saw the patient for a transient confusional episode in April 2020: Patient was seen back on 10/2017 for a right parietal infarct in the setting of old embolic infarcts.  Symptoms at that time or confusion such as having difficulty dressing himself in the morning and difficulty driving.  MRI at that time did show a right parietal infarct along with old right paramedial frontal infarcts along with basal ganglia lacunar wounds and numerous microhemorrhages.  MRA showed no L VO.  Echocardiogram showed left ventricular EF of 55% - 60%.  TEE showed no evidence of thrombus in the atrial cavity or appendage.  Loop recorder was implanted at that time.  LDL was 75.  A1c was 6.4.  There was consideration of patient being placed on the [place] trial-however he did not enroll  Notably in all documented neurological examinations despite his significant stroke he is described as having 5 out of 5 strength bilaterally with no clear residual deficit  In the ED he was found to have right-sided gaze and left-sided weakness.  Head CT reviewed by myself and discussed with neuroradiology showed no clear new process.  On further evaluation, I was concerned for a weaker  left corneal reflex compared to the right and right leg weakness, concerning for potential pontine stroke.  Course was also complicated by new complete heart block with heart rates dropping into the 30s (intermittently recovering to 70s), patient reporting some nausea, fluctuating alertness for which she was intubated for airway protection, and concern for new onset diabetes given his blood sugars in the 500s, with labs additionally notable for significant AKI (creatinine 2.7).  After his airway was secured by ED provider, he was taken to MRI to rule out new large vessel occlusion.  MRI brain and MRA were discussed with neuroradiology and personally reviewed by myself.  No evidence of any acute intracranial process.  He was then taken emergently to the Cath Lab by cardiology.  LKW: Unclear at this time tPA given?: No, due to medical instability and unclear last known well Premorbid modified rankin scale:      0 - No symptoms.  ROS: Unable to obtain due to altered mental status.   Past Medical History:  Diagnosis Date  . BPH (benign prostatic hyperplasia)   . CVA (cerebral vascular accident) (HCC) 10/23/2017   CT confirms subacute R parietal infarct  . GERD (gastroesophageal reflux disease)   . High cholesterol   . Hypertension    Past Surgical History:  Procedure Laterality Date  . LEFT HEART CATH AND CORONARY ANGIOGRAPHY N/A 01/28/2020   Procedure: LEFT HEART CATH AND CORONARY ANGIOGRAPHY;  Surgeon: Laurier Nancy, MD;  Location: [facility] INVASIVE CV LAB;  Service: Cardiovascular;  Laterality: N/A;  . LOOP RECORDER INSERTION N/A 10/27/2017  Procedure: LOOP RECORDER INSERTION;  Surgeon: Marinus Maw, MD;  Location: [facility] INVASIVE CV LAB;  Service: Cardiovascular;  Laterality: N/A;  . NO PAST SURGERIES    . TEE WITHOUT CARDIOVERSION N/A 10/27/2017   Procedure: TRANSESOPHAGEAL ECHOCARDIOGRAM (TEE);  Surgeon: Pricilla Riffle, MD;  Location: [facility] ENDOSCOPY;  Service: Cardiovascular;  Laterality: N/A;    Family History  Problem Relation Age of Onset  . CVA Mother 76  . Stroke Mother   . Heart attack Father        88  . CVA Father   . Stroke Father    Social History:  reports that he quit smoking about 31 years ago. His smoking use included cigarettes and pipe. He has a 1.20 pack-year smoking history. He has never used smokeless tobacco. He reports that he does not drink alcohol and does not use drugs.  Exam: Current vital signs: BP (!) 187/55   Pulse (!) 35   Resp 18   Ht 5\' 11"  (1.803 m)   Wt 75.8 kg   SpO2 100%   BMI 23.31 kg/m  Vital signs in last 24 hours: Pulse Rate:  [35-37] 35 (12/17 1000) Resp:  [16-35] 18 (12/17 1000) BP: (151-187)/(51-89) 187/55 (12/17 1000) SpO2:  [100 %] 100 % (12/17 1120) FiO2 (%):  [70 %-100 %] 70 % (12/17 1120) Weight:  [75.8 kg] 75.8 kg (12/17 0900)   Physical Exam  Constitutional: Appears well-developed and well-nourished, acutely ill Psych: Minimally interactive. Eyes: No scleral injection HENT: C-collar in place MSK: no joint deformities.  Cardiovascular: Complete heart block, but perfusing extremities well Respiratory: Labored breathing GI: Soft.  No distension. Skin: Warm dry and intact visible skin  Neuro: Mental Status: At best, patient is able to follow some commands such as show me 2 fingers and stick out your tongue.  He is disoriented, reporting that it is October Tober but reporting his age correctly is 50.  He is drowsy.  He has minimal verbal output and does not always follow commands Does appear to neglect the left side intermittently Cranial Nerves: II: Visual Fields with no clear blink to threat. Pupils are equal, round, and reactive to light.  2.5 to 1 mm III,IV, VI: He has intermittent midline gaze but intermittent right gaze preference.  He does not bury to the left. V: Facial sensation is notable for a reduced eyelash brush to the left VII: Minimal facial movements, unable to accurately assess for droop  VIII:  hearing is intact to voice, orients to examiner XII: tongue is midline without atrophy or fasciculations.  Motor: He is purposeful with the right upper extremity, at least 4/5 He has minimal movement to noxious stimulation of the right lower extremity, 2/5 Left upper extremity is purposeful spontaneously, at least 3/5, but intermittently weak when tested Left lower extremity is intermittently weak, but at best does have some purposeful movement at least 2/5 Sensory: Appears equally reactive to noxious stimulation in all 4 extremities Deep Tendon Reflexes: 2+ and symmetric in the biceps and patellae.  Plantars: Toes are mute bilaterally Cerebellar: Unable to assess secondary to patient's mental status    NIHSS total  1 points for drowsiness 1 points for answering month wrong 1 points for gaze deviation 3 points for visual Fields 2 points for left upper extremity weakness 1 points for right upper extremity weakness 2 points for left lower extremity weakness 3 points for right lower extremity s 2 points for aphasia  1 point for dysarthria  2 points  for complete neglect of the left side (intermittent)   score breakdown: 16   I have reviewed labs in epic and the results pertinent to this consultation are: As documented in HPI above  I have reviewed the images obtained: As documented in HPI above, head CT, MRI and MRA reviewed  Impression: This is a 74 year old gentleman with past medical history significant for prior right parietal strokes, presenting in new onset DKA/HHS and complete heart block.  Suspect his symptoms may be secondary to recrudescence in the setting of these significant metabolic derangements, however provoked seizure in the setting of metabolic derangements is also a possibility.  At this point, medical stabilization per cardiology and primary team is critical.  Would not treat for seizures at this time unless there continues to be continuing concern for waxing and  waning mental status.  Additional recommendations outside of those given during the code stroke as listed in HPI: -Appreciate treatment of medical conditions per cardiology and primary team -Routine EEG when medically stabilized to assess for potential epileptogenic activity -Neurology will be available on an as-needed basis, please page neurologist on-call if EEG read reports epileptogenic activity  55 minutes were spent in the critical care of this patient today including discussions with ED provider, cardiology, neuroradiology as above, more than 50% at bedside  Brooke Dare MD-PhD [facility] [phone]

## 2020-04-07 NOTE — ED Notes (Signed)
Color change . 26 at the lip

## 2020-04-07 NOTE — Progress Notes (Signed)
eLink Physician-Brief Progress Note Patient Name: Jordan [name] DOB: 1945/10/26 MRN: [id number]   Date of Service  04/07/2020  HPI/Events of Note  RT requesting order for ABG.  eICU Interventions  ABG ordered.        Jordan [name] 04/07/2020, 11:09 PM

## 2020-04-07 NOTE — Progress Notes (Signed)
EEG complete - results pending 

## 2020-04-07 NOTE — H&P (View-Only) (Signed)
Cardiology Consultation:  Patient ID: Jordan [name] MRN: [id number]; DOB: 12-05-1945  Admit date: 04/07/2020 Date of Consult: 04/07/2020  Primary Care Provider: Sherrie Mustache, MD Primary Cardiologist: No primary care provider on file.  Primary Electrophysiologist:  None   Patient Profile:  Jordan [name] is a 74 y.o. male with a hx of diabetes, hypertension, CKD, nonobstructive CAD who is being seen today for the evaluation of complete heart block at the request of Leonel Ramsay.  History of Present Illness:  Jordan [name] presents after being found down by family.  He was brought to the hospital unresponsive.  He is not answering questions or following commands.   He was found to be in complete heart block.  Blood pressure stable.  He was transcutaneously paced when I was first in the room.  We did stop this and his blood pressure is stable.  Blood pressure is 160/90 on last check.  Heart rate is in the 40s.  He is in complete heart block with a right bundle branch morphology.  He does have an escape rhythm in the 40 to 50 bpm range.  CT head with no acute intracranial hemorrhage.  No evidence of acute infarction.  There is multiple chronic old infarcts.  There are chronic microvascular ischemic changes.  Neurology wishes to proceed with MRI.  Laboratory data show ABG 7.3 7/38/31.  BUN 47, creatinine 2.7, potassium 5.0, calcium 1.1.  White blood cell count 17.7.  Hemoglobin 14.7.  Platelets 263.  Heart Pathway Score:       Past Medical History: Past Medical History:  Diagnosis Date  . BPH (benign prostatic hyperplasia)   . CVA (cerebral vascular accident) (HCC) 10/23/2017   CT confirms subacute R parietal infarct  . GERD (gastroesophageal reflux disease)   . High cholesterol   . Hypertension     Past Surgical History: Past Surgical History:  Procedure Laterality Date  . LEFT HEART CATH AND CORONARY ANGIOGRAPHY N/A 01/28/2020   Procedure: LEFT HEART CATH AND CORONARY ANGIOGRAPHY;   Surgeon: Laurier Nancy, MD;  Location: [facility] INVASIVE CV LAB;  Service: Cardiovascular;  Laterality: N/A;  . LOOP RECORDER INSERTION N/A 10/27/2017   Procedure: LOOP RECORDER INSERTION;  Surgeon: Marinus Maw, MD;  Location: [facility] INVASIVE CV LAB;  Service: Cardiovascular;  Laterality: N/A;  . NO PAST SURGERIES    . TEE WITHOUT CARDIOVERSION N/A 10/27/2017   Procedure: TRANSESOPHAGEAL ECHOCARDIOGRAM (TEE);  Surgeon: Pricilla Riffle, MD;  Location: [facility] ENDOSCOPY;  Service: Cardiovascular;  Laterality: N/A;     Home Medications:  Prior to Admission medications   Medication Sig Start Date End Date Taking? Authorizing Provider  acetaminophen (TYLENOL) 500 MG tablet Take 1,000 mg by mouth daily as needed for headache (pain).    [provider]  Ascorbic Acid (VITAMIN C) 1000 MG tablet Take 1,000 mg by mouth daily.    [provider]  aspirin 81 MG chewable tablet Chew 81 mg by mouth daily.     [provider]  Cholecalciferol (VITAMIN D3) 1.25 MG (50000 UT) CAPS Take 50,000 Units by mouth every Saturday.    [provider]  clopidogrel (PLAVIX) 75 MG tablet Take 1 tablet (75 mg total) by mouth daily. 10/29/17   Bloomfield, Carley D, DO  losartan (COZAAR) 100 MG tablet Take 100 mg by mouth daily.    [provider]  Multiple Vitamin (MULTIVITAMIN WITH MINERALS) TABS tablet Take 1 tablet by mouth daily.    [provider]  nitroGLYCERIN (NITROSTAT)  0.4 MG SL tablet Place 1 tablet (0.4 mg total) under the tongue every 5 (five) minutes as needed for chest pain. Patient not taking: Reported on 01/28/2020 03/09/18   Synetta Fail, MD  Omega-3 Fatty Acids (FISH OIL) 1200 MG CAPS Take 1,200 mg by mouth daily.     [provider]  Red Yeast Rice 600 MG TABS Take 600 mg by mouth daily.    [provider]  rosuvastatin (CRESTOR) 20 MG tablet Take 20 mg by mouth daily.    [provider]  senna (SENOKOT) 8.6 MG TABS tablet Take 1  tablet by mouth daily as needed for mild constipation.    [provider]  sertraline (ZOLOFT) 50 MG tablet Take 1 tablet (50 mg total) by mouth daily. 10/28/17   Bloomfield, Carley D, DO  triamterene-hydrochlorothiazide (MAXZIDE-25) 37.5-25 MG tablet Take 1 tablet by mouth daily. 02/21/19   Frederica Kuster, MD    Inpatient Medications: Scheduled Meds: . docusate  100 mg Per Tube BID  . etomidate      . fentaNYL (SUBLIMAZE) injection  25 mcg Intravenous Once  . polyethylene glycol  17 g Per Tube Daily  . rocuronium bromide      . succinylcholine       Continuous Infusions: . sodium chloride     Followed by  . sodium chloride    . dextrose 5% lactated ringers    . fentaNYL infusion INTRAVENOUS    . insulin    . lactated ringers    . lactated ringers     PRN Meds: dextrose, fentaNYL  Allergies:    No Known Allergies  Social History:   Social History   Socioeconomic History  . Marital status: Divorced    Spouse name: Not on file  . Number of children: Not on file  . Years of education: Not on file  . Highest education level: Not on file  Occupational History  . Not on file  Tobacco Use  . Smoking status: Former Smoker    Packs/day: 0.12    Years: 10.00    Pack years: 1.20    Types: Cigarettes, Pipe    Quit date: 1990    Years since quitting: 31.9  . Smokeless tobacco: Never Used  Vaping Use  . Vaping Use: Never used  Substance and Sexual Activity  . Alcohol use: Never  . Drug use: Never  . Sexual activity: Not on file  Other Topics Concern  . Not on file  Social History Narrative  . Not on file   Social Determinants of Health   Financial Resource Strain: Not on file  Food Insecurity: Not on file  Transportation Needs: Not on file  Physical Activity: Not on file  Stress: Not on file  Social Connections: Not on file  Intimate Partner Violence: Not on file     Family History:    Family History  Problem Relation Age of Onset  . CVA Mother  19  . Stroke Mother   . Heart attack Father        69  . CVA Father   . Stroke Father      ROS:  All other ROS reviewed and negative. Pertinent positives noted in the HPI.     Physical Exam/Data:   Vitals:   04/07/20 0900 04/07/20 1006  SpO2:  100%  Weight: 75.8 kg    No intake or output data in the 24 hours ending 04/07/20 1009  Last 3 Weights 04/07/2020 01/28/2020  12/30/2019  Weight (lbs) 167 lb 1.7 oz 175 lb 0.7 oz 175 lb  Weight (kg) 75.8 kg 79.4 kg 79.379 kg    Body mass index is 23.31 kg/m.  General: Ill-appearing, nonresponsive Head: Atraumatic, normal size  Eyes: No scleral icterus Neck: Supple, no JVD Endocrine: No thryomegaly Cardiac: Normal S1, S2; RRR; no murmurs, rubs, or gallops Lungs: Diminished breath sounds bilaterally Abd: Soft, nontender, no hepatomegaly  Ext: No edema, pulses 2+ Musculoskeletal: No deformities Skin: Warm and dry, no rashes   Neuro: Awake, not protecting airway, intubation performed by was in the room  EKG:  The EKG was personally reviewed and demonstrates: Sinus rhythm with atrial rate around 100 bpm, escape rhythm noted, complete heart block, complete A-V dissociation, he is in an escape rhythm with rate of 32 bpm, the skate rhythm is right bundle branch morphology with left anterior fascicular block Telemetry:  Telemetry was personally reviewed and demonstrates: Complete heart block  Relevant CV Studies:  Left heart cath 01/28/2020 -25% mid LAD, 25% RCA -Nonobstructive CAD  Echo 10/24/2017 EF 55 to 60%, no wall motion normalities  Laboratory Data: High Sensitivity Troponin:  No results for input(s): TROPONINIHS in the last 720 hours.   Cardiac EnzymesNo results for input(s): TROPONINI in the last 168 hours. No results for input(s): TROPIPOC in the last 168 hours.  Chemistry Recent Labs  Lab 04/07/20 0922 04/07/20 0952  NA 138 137  K 5.0 5.2*  CL 105  --   GLUCOSE 528*  --   BUN 47*  --   CREATININE 2.70*  --     No  results for input(s): PROT, ALBUMIN, AST, ALT, ALKPHOS, BILITOT in the last 168 hours. Hematology Recent Labs  Lab 04/07/20 0915 04/07/20 0922 04/07/20 0952  WBC 17.7*  --   --   RBC 5.09  --   --   HGB 14.7 15.6 15.0  HCT 47.3 46.0 44.0  MCV 92.9  --   --   MCH 28.9  --   --   MCHC 31.1  --   --   RDW 13.2  --   --   PLT 263  --   --    BNPNo results for input(s): BNP, PROBNP in the last 168 hours.  DDimer No results for input(s): DDIMER in the last 168 hours.  Radiology/Studies:  CT CERVICAL SPINE WO CONTRAST  Result Date: 04/07/2020 CLINICAL DATA:  74-year-old male with a history of neurologic deficit EXAM: CT CERVICAL SPINE WITHOUT CONTRAST TECHNIQUE: Multidetector CT imaging of the cervical spine was performed without intravenous contrast. Multiplanar CT image reconstructions were also generated. COMPARISON:  None. FINDINGS: Alignment: Craniocervical junction aligned. Anatomic alignment of the cervical elements. No subluxation. Skull base and vertebrae: No acute fracture at the skullbase. Vertebral body heights relatively maintained. No acute fracture identified. Soft tissues and spinal canal: Unremarkable cervical soft tissues. Lymph nodes are present, though not enlarged. Disc levels: Disc space narrowing at C5-C6 and C6-C7 with endplate sclerosis, anterior osteophyte production, and uncovertebral joint disease. Less degree of joint space narrowing at C4-C5. Mild right foraminal narrowing at C6-C7 secondary to uncovertebral joint disease. Ankylosis of the right greater than left facets at C2-C3. Left-sided facet ankylosis at C4-C5. No significant bony canal narrowing. Upper chest: Unremarkable appearance of the lung apices. Other: No bony canal narrowing. IMPRESSION: Negative for acute fracture or malalignment of the cervical spine. Degenerative changes as above. Electronically Signed   By: Jaime  Wagner D.O.   On: 04/07/2020 09:41     CT HEAD CODE STROKE WO CONTRAST  Result Date:  04/07/2020 CLINICAL DATA:  Code stroke.  Left-sided weakness EXAM: CT HEAD WITHOUT CONTRAST TECHNIQUE: Contiguous axial images were obtained from the base of the skull through the vertex without intravenous contrast. COMPARISON:  02/20/2019 FINDINGS: Brain: There is no acute intracranial hemorrhage, mass effect, or edema. No new loss of gray-white differentiation. Multiple chronic infarcts are again identified within encephalomalacia/gliosis present in the parasagittal right frontal lobe, right parietal lobe, and left occipital lobe. There are also chronic small vessel infarcts of the left basal ganglia and adjacent white matter and left thalamus. Prominence of the ventricles and sulci reflects stable parenchymal volume loss. Additional patchy and confluent areas of hypoattenuation in the supratentorial white matter probably reflect stable chronic microvascular ischemic changes. Vascular: No hyperdense vessel. There is intracranial atherosclerotic calcification at the skull base. Skull: Unremarkable. Sinuses/Orbits: No acute abnormality. Other: Mastoid air cells are clear. IMPRESSION: No acute intracranial hemorrhage or evidence of acute infarction. Multiple chronic infarcts.  Chronic microvascular ischemic changes. These results were communicated to Dr. Iver Nestle at 9:38 am on 04/07/2020 by text page via the [facility] messaging system. Electronically Signed   By: Guadlupe Spanish M.D.   On: 04/07/2020 09:41    Assessment and Plan:   1. Complete Heart Block -Admitted with concern for stroke and altered mentation.  Significantly elevated blood glucose levels.  He is not acidotic.  His EKG shows right bundle branch block with left anterior fascicular block morphology of the QRS complexes.  He is in a ventricular escape rhythm in the 30 to 50 bpm range.  He is in complete heart block.  Blood pressure is stable. -I did discuss his case with electrophysiology.  We will plan to proceed for emergent temporary pacing wire.   Given his ongoing metabolic derangements and concerns for stroke he will need to be more stable.  The plan is for neurology to pursue MRI and then we will pursue temporary pacing wire after this. -Notified Deloris Sturdivent [phone]) by phone of critical condition  -Due to concerns for stroke and significant hyperglycemia, I would recommend admission to either the neuro ICU or medical ICU.  For questions or updates, please contact [facility] Please consult [email or web address] for contact info under   Signed, Gerri Spore T. Flora Lipps, MD [place]  [facility]  04/07/2020 10:09 AM

## 2020-04-07 NOTE — Consult Note (Signed)
ELECTROPHYSIOLOGY CONSULT NOTE    Patient ID: Jordan [name] MRN: [id number], DOB/AGE: 20-Feb-1946 74 y.o.  Admit date: 04/07/2020 Date of Consult: 04/08/2020  Primary Physician: Casilda Carls, MD Primary Cardiologist: No primary care provider on file.  Electrophysiologist: Lars Mage, MD  Referring Provider: Dr. Audie Box  Patient Profile: Jordan [name] is a 74 y.o. male with a history of DM2, HTN, CKD, Non-obstructive CAD who is being seen today for the evaluation of CHB at the request of Dr. Audie Box.  HPI:  Jordan [name] is a 74 y.o. male with medical history as above.  He was found down by police with well check after not answering family's calls and brought to the hospital via EMS. On arrival he was not answering questions or following commands. Found to be in [place]. Transcutaneously paced though hemodynamically stable. Code stroke called with unresponsiveness.   CT head with no acute change. MRI negative for stroke, multiple chronic old infarcts.   Pertinent labs on admission include ABG 7.3 7/38/31.  BUN 47, creatinine 2.7, potassium 5.0, calcium 1.1.  White blood cell count 17.7.  Hemoglobin 14.7.  Platelets 263.  HR did not improve despite atropine. Pts mental status continued to decline and was intubated. Blood sugar elevated at > 500 with no known history of DM.  COVID negative.   Patient is encephalopathic and/or intubated. History has been obtained from chart review.   Patient has a history of cryptogenic stroke for which he had a loop recorder implanted on 10/27/2017. His most recent remote check on 12/31/2018 showed no arrhythmias or brady episodes.     Past Medical History:  Diagnosis Date  . BPH (benign prostatic hyperplasia)   . CVA (cerebral vascular accident) ([place]) 10/23/2017   CT confirms subacute R parietal infarct  . GERD (gastroesophageal reflux disease)   . High cholesterol   . Hypertension      Surgical History:  Past Surgical History:  Procedure  Laterality Date  . LEFT HEART CATH AND CORONARY ANGIOGRAPHY N/A 01/28/2020   Procedure: LEFT HEART CATH AND CORONARY ANGIOGRAPHY;  Surgeon: Dionisio David, MD;  Location: [facility];  Service: Cardiovascular;  Laterality: N/A;  . LOOP RECORDER INSERTION N/A 10/27/2017   Procedure: LOOP RECORDER INSERTION;  Surgeon: Evans Lance, MD;  Location: [facility];  Service: Cardiovascular;  Laterality: N/A;  . NO PAST SURGERIES    . TEE WITHOUT CARDIOVERSION N/A 10/27/2017   Procedure: TRANSESOPHAGEAL ECHOCARDIOGRAM (TEE);  Surgeon: Fay Records, MD;  Location: [facility];  Service: Cardiovascular;  Laterality: N/A;     Medications Prior to Admission  Medication Sig Dispense Refill Last Dose  . acetaminophen (TYLENOL) 500 MG tablet Take 1,000 mg by mouth daily as needed for headache (pain).     Marland Kitchen albuterol (VENTOLIN HFA) 108 (90 Base) MCG/ACT inhaler Inhale 2 puffs into the lungs every 4 (four) hours as needed.     Marland Kitchen Apple Cid Vn-Grn Tea-Bit Or-Cr (APPLE CIDER VINEGAR PLUS) TABS Take 1-2 tablets by mouth daily.     . Ascorbic Acid (VITAMIN C) 1000 MG tablet Take 1,000 mg by mouth daily.     Marland Kitchen aspirin 81 MG chewable tablet Chew 81 mg by mouth daily.      . carvedilol (COREG) 12.5 MG tablet Take 12.5 mg by mouth 2 (two) times daily.     . Cholecalciferol (VITAMIN D3) 1.25 MG (50000 UT) CAPS Take 50,000 Units by mouth every Saturday.     . clopidogrel (  PLAVIX) 75 MG tablet Take 1 tablet (75 mg total) by mouth daily. 90 tablet 3   . GARLIC PO Take 1 tablet by mouth daily.     . hydrochlorothiazide (HYDRODIURIL) 25 MG tablet Take 25 mg by mouth daily.     Marland Kitchen losartan (COZAAR) 100 MG tablet Take 100 mg by mouth daily.     . Multiple Vitamin (MULTIVITAMIN WITH MINERALS) TABS tablet Take 1 tablet by mouth daily.     . nitroGLYCERIN (NITROSTAT) 0.4 MG SL tablet Place 1 tablet (0.4 mg total) under the tongue every 5 (five) minutes as needed for chest pain. 10 tablet 0   . Omega-3 Fatty Acids  (FISH OIL) 1200 MG CAPS Take 1,200 mg by mouth daily.      . Red Yeast Rice 600 MG TABS Take 600 mg by mouth daily.     . rosuvastatin (CRESTOR) 20 MG tablet Take 20 mg by mouth daily.     Marland Kitchen senna (SENOKOT) 8.6 MG TABS tablet Take 1 tablet by mouth daily as needed for mild constipation.     . sertraline (ZOLOFT) 50 MG tablet Take 1 tablet (50 mg total) by mouth daily. 30 tablet 1   . sildenafil (VIAGRA) 100 MG tablet Take 100 mg by mouth daily as needed.     . triamterene-hydrochlorothiazide (MAXZIDE-25) 37.5-25 MG tablet Take 1 tablet by mouth daily. 30 tablet 1     Inpatient Medications:  . aspirin  81 mg Per Tube Daily  . chlorhexidine gluconate (MEDLINE KIT)  15 mL Mouth Rinse BID  . Chlorhexidine Gluconate Cloth  6 each Topical Daily  . docusate  100 mg Per Tube BID  . heparin  5,000 Units Subcutaneous Q8H  . mouth rinse  15 mL Mouth Rinse 10 times per day  . mupirocin ointment  1 application Nasal BID  . polyethylene glycol  17 g Per Tube Daily  . rosuvastatin  20 mg Per Tube Daily  . sodium chloride flush  10-40 mL Intracatheter Q12H    Allergies: No Known Allergies  Social History   Socioeconomic History  . Marital status: Divorced    Spouse name: Not on file  . Number of children: Not on file  . Years of education: Not on file  . Highest education level: Not on file  Occupational History  . Not on file  Tobacco Use  . Smoking status: Former Smoker    Packs/day: 0.12    Years: 10.00    Pack years: 1.20    Types: Cigarettes, Pipe    Quit date: 1990    Years since quitting: 31.9  . Smokeless tobacco: Never Used  Vaping Use  . Vaping Use: Never used  Substance and Sexual Activity  . Alcohol use: Never  . Drug use: Never  . Sexual activity: Not on file  Other Topics Concern  . Not on file  Social History Narrative  . Not on file   Social Determinants of Health   Financial Resource Strain: Not on file  Food Insecurity: Not on file  Transportation Needs:  Not on file  Physical Activity: Not on file  Stress: Not on file  Social Connections: Not on file  Intimate Partner Violence: Not on file     Family History  Problem Relation Age of Onset  . CVA Mother 15  . Stroke Mother   . Heart attack Father        25  . CVA Father   . Stroke Father  Review of Systems: All other systems reviewed and are otherwise negative except as noted above.  Physical Exam: Vitals:   04/08/20 0500 04/08/20 0600 04/08/20 0700 04/08/20 0737  BP: (!) 118/41 114/77 (!) 142/55   Pulse: (!) 58 (!) 58 (!) 59   Resp: (!) 36 (!) 27 (!) 33   Temp:    99.8 F (37.7 C)  TempSrc:    Oral  SpO2: 100% 100% 100%   Weight:      Height:        GEN- Intubated and sedated although follows commands per RN   HEENT: normocephalic, atraumatic; sclera clear, conjunctiva pink; hearing intact; oropharynx clear; neck supple Lungs- Clear to ausculation bilaterally.  No wheezes, rales, rhonchi. Intubated. Heart- Regular rate and rhythm, no murmurs, rubs or gallops. Paced. No underlying ventricular rhythm. GI- soft, non-tender, non-distended, bowel sounds present Extremities- no clubbing, cyanosis, or edema; DP/PT/radial pulses 2+ bilaterally MS- no significant deformity or atrophy Skin- warm and dry, no rash or lesion Psych-  sedated Neuro- sedated  Labs:   Lab Results  Component Value Date   WBC 16.1 (H) 04/07/2020   HGB 12.2 (L) 04/08/2020   HCT 36.0 (L) 04/08/2020   MCV 89.7 04/07/2020   PLT 235 04/07/2020    Recent Labs  Lab 04/07/20 0915 04/07/20 0922 04/08/20 0439  NA 138   < > 145  K 5.2*   < > 3.7  CL 100   < > 109  CO2 21*   < > 27  BUN 44*   < > 36*  CREATININE 3.06*   < > 2.03*  CALCIUM 9.9   < > 9.0  PROT 7.3  --   --   BILITOT 1.2  --   --   ALKPHOS 59  --   --   ALT 34  --   --   AST 39  --   --   GLUCOSE 542*   < > 232*   < > = values in this interval not displayed.      Radiology/Studies: EEG  Result Date: 04/07/2020 Lora Havens, MD     04/07/2020  5:22 PM Patient Name: Jordan [name] MRN: [id number] Epilepsy Attending: Lora Havens Referring Physician/Provider: Dr Lesleigh Noe Date: 04/07/2020 Duration: 23.43 mins Patient history: 74yo M with ams. EEG to evaluate for seizure. Level of alertness:  comatose AEDs during EEG study: Propofol Technical aspects: This EEG study was done with scalp electrodes positioned according to the 10-20 International system of electrode placement. Electrical activity was acquired at a sampling rate of '500Hz'  and reviewed with a high frequency filter of '70Hz'  and a low frequency filter of '1Hz' . EEG data were recorded continuously and digitally stored. Description: EEG showed continuous generalized 3 to 6 Hz theta-delta slowing. Hyperventilation and photic stimulation were not performed.   ABNORMALITY - Continuous slow, generalized IMPRESSION: This study is suggestive of moderate to severe diffuse encephalopathy. No seizures or epileptiform discharges were seen throughout the recording. Lora Havens   CT CERVICAL SPINE WO CONTRAST  Result Date: 04/07/2020 CLINICAL DATA:  74 year old male with a history of neurologic deficit EXAM: CT CERVICAL SPINE WITHOUT CONTRAST TECHNIQUE: Multidetector CT imaging of the cervical spine was performed without intravenous contrast. Multiplanar CT image reconstructions were also generated. COMPARISON:  None. FINDINGS: Alignment: Craniocervical junction aligned. Anatomic alignment of the cervical elements. No subluxation. Skull base and vertebrae: No acute fracture at the skullbase. Vertebral body heights relatively maintained. No acute fracture  identified. Soft tissues and spinal canal: Unremarkable cervical soft tissues. Lymph nodes are present, though not enlarged. Disc levels: Disc space narrowing at C5-C6 and C6-C7 with endplate sclerosis, anterior osteophyte production, and uncovertebral joint disease. Less degree of joint space narrowing at C4-C5. Mild  right foraminal narrowing at C6-C7 secondary to uncovertebral joint disease. Ankylosis of the right greater than left facets at C2-C3. Left-sided facet ankylosis at C4-C5. No significant bony canal narrowing. Upper chest: Unremarkable appearance of the lung apices. Other: No bony canal narrowing. IMPRESSION: Negative for acute fracture or malalignment of the cervical spine. Degenerative changes as above. Electronically Signed   By: Corrie Mckusick D.O.   On: 04/07/2020 09:41   MR ANGIO HEAD WO CONTRAST  Result Date: 04/07/2020 CLINICAL DATA:  Code stroke follow-up EXAM: MRI HEAD WITHOUT CONTRAST MRA HEAD WITHOUT CONTRAST TECHNIQUE: Multiplanar, multiecho pulse sequences of the brain and surrounding structures were obtained without intravenous contrast. Angiographic images of the head were obtained using MRA technique without contrast. COMPARISON:  None. FINDINGS: MRI HEAD Axial DWI and T2 FLAIR sequences were obtained. There is no acute infarction. Prominence of the ventricles and sulci reflects generalized parenchymal volume loss. 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 There is no hydrocephalus or extra-axial collection. MRA HEAD Intracranial internal carotid arteries are patent. Middle and anterior cerebral arteries are patent. Intracranial vertebral arteries, basilar artery, posterior cerebral arteries are patent. Bilateral posterior communicating arteries are present. There is no significant stenosis or aneurysm. IMPRESSION: No acute infarction. No proximal intracranial vessel occlusion. Electronically Signed   By: Macy Mis M.D.   On: 04/07/2020 11:13   MR BRAIN WO CONTRAST  Result Date: 04/07/2020 CLINICAL DATA:  Code stroke follow-up EXAM: MRI HEAD WITHOUT CONTRAST MRA HEAD WITHOUT CONTRAST TECHNIQUE: Multiplanar, multiecho pulse sequences of the brain and  surrounding structures were obtained without intravenous contrast. Angiographic images of the head were obtained using MRA technique without contrast. COMPARISON:  None. FINDINGS: MRI HEAD Axial DWI and T2 FLAIR sequences were obtained. There is no acute infarction. Prominence of the ventricles and sulci reflects generalized parenchymal volume loss. 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 There is no hydrocephalus or extra-axial collection. MRA HEAD Intracranial internal carotid arteries are patent. Middle and anterior cerebral arteries are patent. Intracranial vertebral arteries, basilar artery, posterior cerebral arteries are patent. Bilateral posterior communicating arteries are present. There is no significant stenosis or aneurysm. IMPRESSION: No acute infarction. No proximal intracranial vessel occlusion. Electronically Signed   By: Macy Mis M.D.   On: 04/07/2020 11:13   CARDIAC CATHETERIZATION  Result Date: 04/07/2020 Successful placement of temporary transvenous pacemaker via right jugular access. Plan: check portable CXR for line placement. Will leave at rate 60 bpm, 5 mA output. Capture threshold < 0.1 mA  DG Chest Portable 1 View  Result Date: 04/07/2020 CLINICAL DATA:  Unresponsive.  Intubation. EXAM: PORTABLE CHEST 1 VIEW COMPARISON:  Chest x-ray dated December 30, 2019. FINDINGS: The patient is rotated to the right. Endotracheal tube tip approximately 1.9 cm above the carina. Enteric tube entering the stomach with the tip below the field of view. The heart size and mediastinal contours are within normal limits. Loop recorder again noted. No focal consolidation, pleural effusion, or pneumothorax. No acute osseous abnormality. IMPRESSION: 1. Appropriately  positioned endotracheal tube.  No active disease. Electronically Signed   By: Titus Dubin M.D.    On: 04/07/2020 12:17   DG Chest Port 1V same Day  Result Date: 04/07/2020 CLINICAL DATA:  Confirm pacer placement. EXAM: PORTABLE CHEST 1 VIEW COMPARISON:  April 07, 2020 FINDINGS: There is stable endotracheal tube and nasogastric tube positioning. Interval right internal jugular cardiac pacer lead placement is seen with appropriate lead tip positioning seen overlying the left lung base. The heart size and mediastinal contours are within normal limits. Both lungs are clear. The visualized skeletal structures are unremarkable. IMPRESSION: Interval right internal jugular cardiac pacer placement positioning, as described above, without evidence of acute or active cardiopulmonary disease. Electronically Signed   By: Virgina Norfolk M.D.   On: 04/07/2020 19:20   DG Abd Portable 1 View  Result Date: 04/07/2020 CLINICAL DATA:  Unresponsive.  Enteric tube placement. EXAM: PORTABLE ABDOMEN - 1 VIEW COMPARISON:  None. FINDINGS: Enteric tube in the stomach. The bowel gas pattern is normal. No radio-opaque calculi or other significant radiographic abnormality are seen. No acute osseous abnormality. Bilateral hip osteoarthritis. IMPRESSION: 1. Enteric tube in the stomach. Electronically Signed   By: Titus Dubin M.D.   On: 04/07/2020 12:18   ECHOCARDIOGRAM COMPLETE  Result Date: 04/07/2020    ECHOCARDIOGRAM REPORT   Patient Name:   Jordan [name] Date of Exam: 04/07/2020 Medical Rec #:  [id number]    Height:       71.0 in Accession #:    [id number]   Weight:       167.1 lb Date of Birth:  [birth year]   BSA:          1.954 m Patient Age:    47 years     BP:           131/40 mmHg Patient Gender: M            HR:           36 bpm. Exam Location:  Inpatient Procedure: 2D Echo, Color Doppler, Cardiac Doppler and Intracardiac            Opacification Agent Indications:    Heart block  History:        Patient has prior history of Echocardiogram examinations, most                 recent 10/27/2017. Risk Factors:Sleep  Apnea, Former Smoker,                 Hypertension and Dyslipidemia. GERD.  Sonographer:    Clayton Lefort RDCS (AE) Referring Phys: [id number] [place]  Sonographer Comments: Echo performed with patient supine and on artificial respirator. Limited patient mobility. IMPRESSIONS  1. Left ventricular ejection fraction, by estimation, is 60 to 65%. The left ventricle has normal function. The left ventricle has no regional wall motion abnormalities. There is mild concentric left ventricular hypertrophy. Left ventricular diastolic function could not be evaluated.  2. Right ventricular systolic function is normal. The right ventricular size is normal.  3. The mitral valve is grossly normal. Trivial mitral valve regurgitation.  4. The aortic valve is tricuspid. Aortic valve regurgitation is not visualized. No aortic stenosis is present. Conclusion(s)/Recommendation(s): In heart block throughout study, with ventricular rate in the 30s. Per EMR, teams are aware and managing heart block. FINDINGS  Left Ventricle: Left ventricular ejection fraction, by estimation, is 60 to 65%. The left ventricle has normal function. The left ventricle has no regional  wall motion abnormalities. Definity contrast agent was given IV to delineate the left ventricular  endocardial borders. The left ventricular internal cavity size was normal in size. There is mild concentric left ventricular hypertrophy. Left ventricular diastolic function could not be evaluated. Right Ventricle: The right ventricular size is normal. Right vetricular wall thickness was not well visualized. Right ventricular systolic function is normal. Left Atrium: Left atrial size was normal in size. Right Atrium: Right atrial size was normal in size. Pericardium: There is no evidence of pericardial effusion. Mitral Valve: The mitral valve is grossly normal. There is mild thickening of the mitral valve leaflet(s). There is mild calcification of the mitral valve leaflet(s).  Trivial mitral valve regurgitation. Tricuspid Valve: The tricuspid valve is normal in structure. Tricuspid valve regurgitation is trivial. No evidence of tricuspid stenosis. Aortic Valve: The aortic valve is tricuspid. Aortic valve regurgitation is not visualized. No aortic stenosis is present. Pulmonic Valve: The pulmonic valve was grossly normal. Pulmonic valve regurgitation is trivial. No evidence of pulmonic stenosis. Aorta: The aortic root and ascending aorta are structurally normal, with no evidence of dilitation. Venous: IVC assessment for right atrial pressure unable to be performed due to mechanical ventilation. IAS/Shunts: The atrial septum is grossly normal.  LEFT VENTRICLE PLAX 2D LVIDd:         3.25 cm LVIDs:         2.25 cm LV PW:         1.45 cm LV IVS:        1.25 cm LVOT diam:     1.60 cm LVOT Area:     2.01 cm  RIGHT VENTRICLE RV Basal diam:  3.20 cm RV S prime:     12.60 cm/s TAPSE (M-mode): 2.1 cm LEFT ATRIUM             Index       RIGHT ATRIUM           Index LA diam:        2.80 cm 1.43 cm/m  RA Area:     11.50 cm LA Vol (A2C):   47.5 ml 24.31 ml/m RA Volume:   22.20 ml  11.36 ml/m LA Vol (A4C):   27.5 ml 14.08 ml/m LA Biplane Vol: 36.5 ml 18.68 ml/m   AORTA Ao Root diam: 3.10 cm Ao Asc diam:  2.50 cm TRICUSPID VALVE TR Peak grad:   15.4 mmHg TR Vmax:        196.00 cm/s  SHUNTS Systemic Diam: 1.60 cm Buford Dresser MD Electronically signed by Buford Dresser MD Signature Date/Time: 04/07/2020/4:27:00 PM    Final    CT HEAD CODE STROKE WO CONTRAST  Result Date: 04/07/2020 CLINICAL DATA:  Code stroke.  Left-sided weakness EXAM: CT HEAD WITHOUT CONTRAST TECHNIQUE: Contiguous axial images were obtained from the base of the skull through the vertex without intravenous contrast. COMPARISON:  02/20/2019 FINDINGS: Brain: There is no acute intracranial hemorrhage, mass effect, or edema. No new loss of gray-white differentiation. Multiple chronic infarcts are again identified  within encephalomalacia/gliosis present in the parasagittal right frontal lobe, right parietal lobe, and left occipital lobe. There are also chronic small vessel infarcts of the left basal ganglia and adjacent white matter and left thalamus. Prominence of the ventricles and sulci reflects stable parenchymal volume loss. Additional patchy and confluent areas of hypoattenuation in the supratentorial white matter probably reflect stable chronic microvascular ischemic changes. Vascular: No hyperdense vessel. There is intracranial atherosclerotic calcification at the skull base. Skull: Unremarkable.  Sinuses/Orbits: No acute abnormality. Other: Mastoid air cells are clear. IMPRESSION: No acute intracranial hemorrhage or evidence of acute infarction. Multiple chronic infarcts.  Chronic microvascular ischemic changes. These results were communicated to Dr. Curly Shores at 9:38 am on 04/07/2020 by text page via the [facility] messaging system. Electronically Signed   By: Macy Mis M.D.   On: 04/07/2020 09:41    EKG: on arrival shows CHB at 32 bpm (personally reviewed)  12/17 Echo personally reviewed EF 60% RV normal No significant valvular abnormalities  Baseline EKG 12/30/2019 shows NSR at 67 bpm with LBBB at 146 ms  TELEMETRY: ventricular paced at 60bpm. (personally reviewed)  Assessment/Plan: 1.  CHB Unclear etiology. Will need PPM, plan for Monday. I spoke with the sister, Deloris, this morning about the procedure including the potential risks and she understands the plan for [facility] Monday. EF 60% NPO Sunday night.  2. Hyperosmolar hyperglycemic syndrome No known h/o DM. Sugars > 400-500 on arrival Per primary team.  3. Encephalopathy Stroke work up negative  4. AKI Cr 3.06 from apparent baseline of 1.3-1.4 (though was 1.7 ER visit in 12/2019)  For questions or updates, please contact [place] Please consult [email or web address] for contact info under Cardiology/STEMI.  Signed, Vickie Epley, MD   04/08/2020 9:26 AM

## 2020-04-07 NOTE — Interval H&P Note (Signed)
History and Physical Interval Note:  04/07/2020 4:22 PM  Jordan [name]  has presented today for surgery, with the diagnosis of heart block.  The various methods of treatment have been discussed with the patient and family. After consideration of risks, benefits and other options for treatment, the patient has consented to  Procedure(s): TEMPORARY PACEMAKER (N/A) as a surgical intervention.  The patient's history has been reviewed, patient examined, no change in status, stable for surgery.  I have reviewed the patient's chart and labs.  Questions were answered to the patient's satisfaction.     Theron Arista [facility] 04/07/2020 4:23 PM

## 2020-04-07 NOTE — ED Provider Notes (Signed)
Procedure Name: Intubation Date/Time: 04/07/2020 10:02 AM Performed by: Shanon Ace, PA-C Pre-anesthesia Checklist: Patient identified, Suction available, Patient being monitored, Emergency Drugs available and Timeout performed Oxygen Delivery Method: Ambu bag Preoxygenation: Pre-oxygenation with 100% oxygen Ventilation: Mask ventilation without difficulty Laryngoscope Size: Glidescope Grade View: Grade I Tube size: 7.0 mm Number of attempts: 1 Airway Equipment and Method: Video-laryngoscopy Placement Confirmation: ETT inserted through vocal cords under direct vision,  Positive ETCO2 and Breath sounds checked- equal and bilateral Secured at: 25 cm Tube secured with: ETT holder Dental Injury: Teeth and Oropharynx as per pre-operative assessment  Difficulty Due To: Difficulty was anticipated         Shanon Ace, PA-C 04/07/20 1011    Jacalyn Lefevre, MD 04/07/20 1227

## 2020-04-07 NOTE — ED Provider Notes (Signed)
[facility] EMERGENCY DEPARTMENT Provider Note   CSN: [id number] Arrival date & time: 04/07/20  4098  An emergency department physician performed an initial assessment on this suspected stroke patient at 0900.  History Chief Complaint  Patient presents with  . Fall    Jordan [name] is a 74 y.o. male.  Pt presents to the ED today with AMS.  Pt has a hx of CVA, but he has recovered from that CVA and had no residual deficits according to old chart.  Per EMS, pt lives alone and family last saw him yesterday afternoon (time unclear).  He pulled an emergency cord between 0000 and 0100 this am.  EMS apparently came out, but could not get into his apartment.  It is unclear what exactly happened this am.  This morning, the police were called to his apartment because he was not responding to family's calls.  They broke in and found him on the ground.  EMS were called and brought him here.  He is unable to give any hx.        Past Medical History:  Diagnosis Date  . BPH (benign prostatic hyperplasia)   . CVA (cerebral vascular accident) ([place]) 10/23/2017   CT confirms subacute R parietal infarct  . GERD (gastroesophageal reflux disease)   . High cholesterol   . Hypertension     Patient Active Problem List   Diagnosis Date Noted  . Hyperglycemic crisis due to diabetes mellitus ([place]) 04/07/2020  . Unstable angina ([place]) 01/24/2020  . Chest pain 03/09/2018  . HTN (hypertension), benign   . History of ischemic left MCA stroke   . Ischemic stroke ([place])   . Hyperlipidemia   . Essential hypertension   . CVA (cerebral vascular accident) ([place]) 10/23/2017    Past Surgical History:  Procedure Laterality Date  . LEFT HEART CATH AND CORONARY ANGIOGRAPHY N/A 01/28/2020   Procedure: LEFT HEART CATH AND CORONARY ANGIOGRAPHY;  Surgeon: Dionisio David, MD;  Location: [facility];  Service: Cardiovascular;  Laterality: N/A;  . LOOP RECORDER INSERTION N/A 10/27/2017    Procedure: LOOP RECORDER INSERTION;  Surgeon: Evans Lance, MD;  Location: [facility];  Service: Cardiovascular;  Laterality: N/A;  . NO PAST SURGERIES    . TEE WITHOUT CARDIOVERSION N/A 10/27/2017   Procedure: TRANSESOPHAGEAL ECHOCARDIOGRAM (TEE);  Surgeon: Fay Records, MD;  Location: [facility] ENDOSCOPY;  Service: Cardiovascular;  Laterality: N/A;       Family History  Problem Relation Age of Onset  . CVA Mother 56  . Stroke Mother   . Heart attack Father        68  . CVA Father   . Stroke Father     Social History   Tobacco Use  . Smoking status: Former Smoker    Packs/day: 0.12    Years: 10.00    Pack years: 1.20    Types: Cigarettes, Pipe    Quit date: 1990    Years since quitting: 31.9  . Smokeless tobacco: Never Used  Vaping Use  . Vaping Use: Never used  Substance Use Topics  . Alcohol use: Never  . Drug use: Never    Home Medications Prior to Admission medications   Medication Sig Start Date End Date Taking? Authorizing Provider  acetaminophen (TYLENOL) 500 MG tablet Take 1,000 mg by mouth daily as needed for headache (pain).    [provider]  Apple Cid Vn-Grn Tea-Bit Or-Cr (APPLE CIDER VINEGAR PLUS) TABS Take  1-2 tablets by mouth daily.    [provider]  Ascorbic Acid (VITAMIN C) 1000 MG tablet Take 1,000 mg by mouth daily.    [provider]  aspirin 81 MG chewable tablet Chew 81 mg by mouth daily.     [provider]  Cholecalciferol (VITAMIN D3) 1.25 MG (50000 UT) CAPS Take 50,000 Units by mouth every Saturday.    [provider]  clopidogrel (PLAVIX) 75 MG tablet Take 1 tablet (75 mg total) by mouth daily. 10/29/17   Bloomfield, Carley D, DO  GARLIC PO Take 1 tablet by mouth daily.    [provider]  losartan (COZAAR) 100 MG tablet Take 100 mg by mouth daily.    [provider]  Multiple Vitamin (MULTIVITAMIN WITH MINERALS) TABS tablet Take 1 tablet by mouth daily.    [provider]  nitroGLYCERIN (NITROSTAT) 0.4 MG SL tablet Place 1 tablet (0.4 mg total) under the tongue every 5 (five) minutes as needed for chest pain. Patient not taking: Reported on 01/28/2020 03/09/18   Marcelyn Bruins, MD  Omega-3 Fatty Acids (FISH OIL) 1200 MG CAPS Take 1,200 mg by mouth daily.     [provider]  Red Yeast Rice 600 MG TABS Take 600 mg by mouth daily.    [provider]  rosuvastatin (CRESTOR) 20 MG tablet Take 20 mg by mouth daily.    [provider]  senna (SENOKOT) 8.6 MG TABS tablet Take 1 tablet by mouth daily as needed for mild constipation.    [provider]  sertraline (ZOLOFT) 50 MG tablet Take 1 tablet (50 mg total) by mouth daily. 10/28/17   Bloomfield, Carley D, DO  triamterene-hydrochlorothiazide (MAXZIDE-25) 37.5-25 MG tablet Take 1 tablet by mouth daily. 02/21/19   Wardell Honour, MD    Allergies    Patient has no known allergies.  Review of Systems   Review of Systems  Unable to perform ROS: Mental status change    Physical Exam Updated Vital Signs BP (!) 187/55   Pulse (!) 35   Resp 18   Ht '5\' 11"'  (1.803 m)   Wt 75.8 kg   SpO2 100%   BMI 23.31 kg/m   Physical Exam Vitals and nursing note reviewed.  Constitutional:      General: He is in acute distress.     Appearance: He is ill-appearing.  HENT:     Head: Normocephalic and atraumatic.     Right Ear: External ear normal.     Left Ear: External ear normal.     Nose: Nose normal.     Mouth/Throat:     Mouth: Mucous membranes are dry.  Eyes:     Pupils: Pupils are equal, round, and reactive to light.  Neck:     Comments: In c-collar Cardiovascular:     Rate and Rhythm: Bradycardia present.     Pulses: Normal pulses.     Heart sounds: Normal heart sounds.  Pulmonary:     Effort: Tachypnea present.     Breath sounds: Normal breath sounds.  Abdominal:     General: Abdomen is flat. Bowel sounds are normal.     Palpations: Abdomen is soft.   Musculoskeletal:        General: No deformity or signs of injury.  Skin:    General: Skin is dry.     Capillary Refill: Capillary refill takes more than 3 seconds.  Neurological:     Comments: Left sided neglect.  Left  sided weakness. Pt can say his first name and that is all the verbalizations I can get him to do.  Psychiatric:     Comments: Unable to assess     ED Results / Procedures / Treatments   Labs (all labs ordered are listed, but only abnormal results are displayed) Labs Reviewed  CBC - Abnormal; Notable for the following components:      Result Value   WBC 17.7 (*)    All other components within normal limits  DIFFERENTIAL - Abnormal; Notable for the following components:   Neutro Abs 14.3 (*)    Monocytes Absolute 1.3 (*)    Abs Immature Granulocytes 0.10 (*)    All other components within normal limits  COMPREHENSIVE METABOLIC PANEL - Abnormal; Notable for the following components:   Potassium 5.2 (*)    CO2 21 (*)    Glucose, Bld 542 (*)    BUN 44 (*)    Creatinine, Ser 3.06 (*)    Albumin 3.3 (*)    GFR, Estimated 21 (*)    Anion gap 17 (*)    All other components within normal limits  CK - Abnormal; Notable for the following components:   Total CK 3,471 (*)    All other components within normal limits  OSMOLALITY - Abnormal; Notable for the following components:   Osmolality 343 (*)    All other components within normal limits  I-STAT CHEM 8, ED - Abnormal; Notable for the following components:   BUN 47 (*)    Creatinine, Ser 2.70 (*)    Glucose, Bld 528 (*)    All other components within normal limits  CBG MONITORING, ED - Abnormal; Notable for the following components:   Glucose-Capillary 458 (*)    All other components within normal limits  I-STAT VENOUS BLOOD GAS, ED - Abnormal; Notable for the following components:   pCO2, Ven 38.0 (*)    pO2, Ven 31.0 (*)    Acid-base deficit 3.0 (*)    Potassium 5.2 (*)    All other components within  normal limits  I-STAT ARTERIAL BLOOD GAS, ED - Abnormal; Notable for the following components:   pH, Arterial 7.478 (*)    pCO2 arterial 26.8 (*)    pO2, Arterial 478 (*)    Bicarbonate 19.8 (*)    TCO2 21 (*)    All other components within normal limits  CBG MONITORING, ED - Abnormal; Notable for the following components:   Glucose-Capillary 500 (*)    All other components within normal limits  RESP PANEL BY RT-PCR (FLU A&B, COVID) ARPGX2  PROTIME-INR  APTT  ETHANOL  RAPID URINE DRUG SCREEN, HOSP PERFORMED  URINALYSIS, ROUTINE W REFLEX MICROSCOPIC  URINALYSIS, ROUTINE W REFLEX MICROSCOPIC  AMMONIA  BETA-HYDROXYBUTYRIC ACID  BLOOD GAS, ARTERIAL  CBC  BASIC METABOLIC PANEL  BASIC METABOLIC PANEL  BASIC METABOLIC PANEL  BASIC METABOLIC PANEL  HEMOGLOBIN A1C    EKG EKG Interpretation  Date/Time:  Friday April 07 2020 09:43:46 EST Ventricular Rate:  32 PR Interval:    QRS Duration: 128 QT Interval:  635 QTC Calculation: 464 R Axis:   -59 Text Interpretation: AV block, complete (third degree) Confirmed by Isla Pence [id number]) on 04/07/2020 10:35:33 AM   Radiology CT CERVICAL SPINE WO CONTRAST  Result Date: 04/07/2020 CLINICAL DATA:  74 year old male with a history of neurologic deficit EXAM: CT CERVICAL SPINE WITHOUT CONTRAST TECHNIQUE: Multidetector CT imaging of the cervical spine was performed without intravenous contrast. Multiplanar CT image  reconstructions were also generated. COMPARISON:  None. FINDINGS: Alignment: Craniocervical junction aligned. Anatomic alignment of the cervical elements. No subluxation. Skull base and vertebrae: No acute fracture at the skullbase. Vertebral body heights relatively maintained. No acute fracture identified. Soft tissues and spinal canal: Unremarkable cervical soft tissues. Lymph nodes are present, though not enlarged. Disc levels: Disc space narrowing at C5-C6 and C6-C7 with endplate sclerosis, anterior osteophyte production,  and uncovertebral joint disease. Less degree of joint space narrowing at C4-C5. Mild right foraminal narrowing at C6-C7 secondary to uncovertebral joint disease. Ankylosis of the right greater than left facets at C2-C3. Left-sided facet ankylosis at C4-C5. No significant bony canal narrowing. Upper chest: Unremarkable appearance of the lung apices. Other: No bony canal narrowing. IMPRESSION: Negative for acute fracture or malalignment of the cervical spine. Degenerative changes as above. Electronically Signed   By: Corrie Mckusick D.O.   On: 04/07/2020 09:41   MR ANGIO HEAD WO CONTRAST  Result Date: 04/07/2020 CLINICAL DATA:  Code stroke follow-up EXAM: MRI HEAD WITHOUT CONTRAST MRA HEAD WITHOUT CONTRAST TECHNIQUE: Multiplanar, multiecho pulse sequences of the brain and surrounding structures were obtained without intravenous contrast. Angiographic images of the head were obtained using MRA technique without contrast. COMPARISON:  None. FINDINGS: MRI HEAD Axial DWI and T2 FLAIR sequences were obtained. There is no acute infarction. Prominence of the ventricles and sulci reflects generalized parenchymal volume loss. 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 There is no hydrocephalus or extra-axial collection. MRA HEAD Intracranial internal carotid arteries are patent. Middle and anterior cerebral arteries are patent. Intracranial vertebral arteries, basilar artery, posterior cerebral arteries are patent. Bilateral posterior communicating arteries are present. There is no significant stenosis or aneurysm. IMPRESSION: No acute infarction. No proximal intracranial vessel occlusion. Electronically Signed   By: Macy Mis M.D.   On: 04/07/2020 11:13   MR BRAIN WO CONTRAST  Result Date: 04/07/2020 CLINICAL DATA:  Code stroke follow-up EXAM: MRI HEAD WITHOUT CONTRAST MRA HEAD  WITHOUT CONTRAST TECHNIQUE: Multiplanar, multiecho pulse sequences of the brain and surrounding structures were obtained without intravenous contrast. Angiographic images of the head were obtained using MRA technique without contrast. COMPARISON:  None. FINDINGS: MRI HEAD Axial DWI and T2 FLAIR sequences were obtained. There is no acute infarction. Prominence of the ventricles and sulci reflects generalized parenchymal volume loss. 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 There is no hydrocephalus or extra-axial collection. MRA HEAD Intracranial internal carotid arteries are patent. Middle and anterior cerebral arteries are patent. Intracranial vertebral arteries, basilar artery, posterior cerebral arteries are patent. Bilateral posterior communicating arteries are present. There is no significant stenosis or aneurysm. IMPRESSION: No acute infarction. No proximal intracranial vessel occlusion. Electronically Signed   By: Macy Mis M.D.   On: 04/07/2020 11:13   DG Chest Portable 1 View  Result Date: 04/07/2020 CLINICAL DATA:  Unresponsive.  Intubation. EXAM: PORTABLE CHEST 1 VIEW COMPARISON:  Chest x-ray dated December 30, 2019. FINDINGS: The patient is rotated to the right. Endotracheal tube tip approximately 1.9 cm above the carina. Enteric tube entering the stomach with the tip below the field of view. The heart size and mediastinal contours are within normal limits. Loop recorder again noted. No focal consolidation, pleural effusion, or pneumothorax. No acute osseous abnormality. IMPRESSION: 1. Appropriately  positioned endotracheal tube.  No active disease. Electronically Signed   By: Titus Dubin M.D.   On: 04/07/2020 12:17   DG Abd Portable 1 View  Result Date: 04/07/2020 CLINICAL DATA:  Unresponsive.  Enteric tube placement. EXAM: PORTABLE ABDOMEN - 1 VIEW  COMPARISON:  None. FINDINGS: Enteric tube in the stomach. The bowel gas pattern is normal. No radio-opaque calculi or other significant radiographic abnormality are seen. No acute osseous abnormality. Bilateral hip osteoarthritis. IMPRESSION: 1. Enteric tube in the stomach. Electronically Signed   By: Titus Dubin M.D.   On: 04/07/2020 12:18   CT HEAD CODE STROKE WO CONTRAST  Result Date: 04/07/2020 CLINICAL DATA:  Code stroke.  Left-sided weakness EXAM: CT HEAD WITHOUT CONTRAST TECHNIQUE: Contiguous axial images were obtained from the base of the skull through the vertex without intravenous contrast. COMPARISON:  02/20/2019 FINDINGS: Brain: There is no acute intracranial hemorrhage, mass effect, or edema. No new loss of gray-white differentiation. Multiple chronic infarcts are again identified within encephalomalacia/gliosis present in the parasagittal right frontal lobe, right parietal lobe, and left occipital lobe. There are also chronic small vessel infarcts of the left basal ganglia and adjacent white matter and left thalamus. Prominence of the ventricles and sulci reflects stable parenchymal volume loss. Additional patchy and confluent areas of hypoattenuation in the supratentorial white matter probably reflect stable chronic microvascular ischemic changes. Vascular: No hyperdense vessel. There is intracranial atherosclerotic calcification at the skull base. Skull: Unremarkable. Sinuses/Orbits: No acute abnormality. Other: Mastoid air cells are clear. IMPRESSION: No acute intracranial hemorrhage or evidence of acute infarction. Multiple chronic infarcts.  Chronic microvascular ischemic changes. These results were communicated to Dr. Curly Shores at 9:38 am on 04/07/2020 by text page via the [facility] messaging system. Electronically Signed   By: Macy Mis M.D.   On: 04/07/2020 09:41    Procedures Procedures (including critical care time)  Medications Ordered in ED Medications  lactated ringers bolus  1,516 mL (has no administration in time range)  insulin regular, human (MYXREDLIN) 100 units/ 100 mL infusion (4.2 Units/hr Intravenous New Bag/Given 04/07/20 1151)  lactated ringers infusion (has no administration in time range)  dextrose 5 % in lactated ringers infusion (has no administration in time range)  dextrose 50 % solution 0-50 mL (has no administration in time range)  rocuronium bromide 100 MG/10ML SOSY (has no administration in time range)  etomidate (AMIDATE) 2 MG/ML injection (has no administration in time range)  succinylcholine (ANECTINE) 200 MG/10ML syringe (has no administration in time range)  docusate (COLACE) 50 MG/5ML liquid 100 mg (has no administration in time range)  polyethylene glycol (MIRALAX / GLYCOLAX) packet 17 g (has no administration in time range)  fentaNYL (SUBLIMAZE) injection 25 mcg (has no administration in time range)  fentaNYL 2562mg in NS 2539m(1079mml) infusion-PREMIX (100 mcg/hr Intravenous New Bag/Given 04/07/20 1000)  fentaNYL (SUBLIMAZE) bolus via infusion 25 mcg (has no administration in time range)  propofol (DIPRIVAN) 1000 MG/100ML infusion (has no administration in time range)  heparin injection 5,000 Units (has no administration in time range)  insulin regular, human (MYXREDLIN) 100 units/ 100 mL infusion (has no administration in time range)  lactated ringers infusion (has no administration in time range)  dextrose 5 % in lactated ringers infusion (has no administration in time range)  dextrose 50 % solution 0-50 mL (has no administration in time range)  potassium chloride 10 mEq in 100 mL IVPB (has no administration in time range)  sodium chloride 0.9 % bolus 500 mL (500  mLs Intravenous New Bag/Given 04/07/20 1131)    ED Course  I have reviewed the triage vital signs and the nursing notes.  Pertinent labs & imaging results that were available during my care of the patient were reviewed by me and considered in my medical decision making  (see chart for details).    MDM Rules/Calculators/A&P                          Code stroke called upon pt's arrival to the ED.  Neurology team met pt at bedside and we went to the [facility].  CT showed some chronic infarcts.  He is not in the tpa window.  He went to the MRI scanner which showed no acute infarct.  Neuro believes it's a recrudescence of his prior stroke. There is nothing for IR to go after.    Pt was also noted to be in a complete heart block.  I spoke with cards who came to see pt.  Pt's MS worsened while in CT.  HR in the 30s, but BP ok.  1 mg atropine given without change.  I paced him to see if it would help his MS, but it did not.  Cardiology recommended stopping the pacing as bp was ok.  They will take him to the cath lab for a temporary wire.  Due to decline in MS, I decided to intubate pt.  PA Walisiewicz intubated pt while I stood directly next to her monitoring progress.  O2 decreased briefly during intubation, but came back up with bagging.  Pt also does not have a hx of DM on his chart and BS elevated.  IVFs and IV insulin ordered.  Pt does not appear to be in DKA.  It is likely type 2.  I did leave a message with his sister.  She has called me back.  She is here in [place] and I let her know what is going on.  Pt's son is in [facility] right now.    Covid negative.  Pt d/w CCM who will see pt and admit.   CRITICAL CARE Performed by: Isla Pence   Total critical care time: 60 minutes  Critical care time was exclusive of separately billable procedures and treating other patients.  Critical care was necessary to treat or prevent imminent or life-threatening deterioration.  Critical care was time spent personally by me on the following activities: development of treatment plan with patient and/or surrogate as well as nursing, discussions with consultants, evaluation of patient's response to treatment, examination of patient, obtaining history from patient or  surrogate, ordering and performing treatments and interventions, ordering and review of laboratory studies, ordering and review of radiographic studies, pulse oximetry and re-evaluation of patient's condition.   KEY CEN was evaluated in Emergency Department on 04/07/2020 for the symptoms described in the history of present illness. He was evaluated in the context of the global COVID-19 pandemic, which necessitated consideration that the patient might be at risk for infection with the SARS-CoV-2 virus that causes COVID-19. Institutional protocols and algorithms that pertain to the evaluation of patients at risk for COVID-19 are in a state of rapid change based on information released by regulatory bodies including the CDC and federal and state organizations. These policies and algorithms were followed during the patient's care in the ED.  Final Clinical Impression(s) / ED Diagnoses Final diagnoses:  Cerebrovascular accident (CVA), unspecified mechanism ([place])  Complete heart block ([place])  AKI (  acute kidney injury) ([place])  New onset type 2 diabetes mellitus ([place])  Traumatic rhabdomyolysis, initial encounter [facility])    Rx / DC Orders ED Discharge Orders    None       Isla Pence, MD 04/07/20 1224

## 2020-04-07 NOTE — ED Notes (Signed)
Cath Lab and RT here. Ready to transport pt up to Cath Lab.

## 2020-04-07 NOTE — Consult Note (Addendum)
Cardiology Consultation:  Patient ID: Jordan [name] MRN: [id number]; DOB: 12-05-1945  Admit date: 04/07/2020 Date of Consult: 04/07/2020  Primary Care Provider: Sherrie Mustache, MD Primary Cardiologist: No primary care provider on file.  Primary Electrophysiologist:  None   Patient Profile:  Jordan [name] is a 74 y.o. male with a hx of diabetes, hypertension, CKD, nonobstructive CAD who is being seen today for the evaluation of complete heart block at the request of Leonel Ramsay.  History of Present Illness:  Jordan [name] presents after being found down by family.  He was brought to the hospital unresponsive.  He is not answering questions or following commands.   He was found to be in complete heart block.  Blood pressure stable.  He was transcutaneously paced when I was first in the room.  We did stop this and his blood pressure is stable.  Blood pressure is 160/90 on last check.  Heart rate is in the 40s.  He is in complete heart block with a right bundle branch morphology.  He does have an escape rhythm in the 40 to 50 bpm range.  CT head with no acute intracranial hemorrhage.  No evidence of acute infarction.  There is multiple chronic old infarcts.  There are chronic microvascular ischemic changes.  Neurology wishes to proceed with MRI.  Laboratory data show ABG 7.3 7/38/31.  BUN 47, creatinine 2.7, potassium 5.0, calcium 1.1.  White blood cell count 17.7.  Hemoglobin 14.7.  Platelets 263.  Heart Pathway Score:       Past Medical History: Past Medical History:  Diagnosis Date  . BPH (benign prostatic hyperplasia)   . CVA (cerebral vascular accident) (HCC) 10/23/2017   CT confirms subacute R parietal infarct  . GERD (gastroesophageal reflux disease)   . High cholesterol   . Hypertension     Past Surgical History: Past Surgical History:  Procedure Laterality Date  . LEFT HEART CATH AND CORONARY ANGIOGRAPHY N/A 01/28/2020   Procedure: LEFT HEART CATH AND CORONARY ANGIOGRAPHY;   Surgeon: Laurier Nancy, MD;  Location: [facility] INVASIVE CV LAB;  Service: Cardiovascular;  Laterality: N/A;  . LOOP RECORDER INSERTION N/A 10/27/2017   Procedure: LOOP RECORDER INSERTION;  Surgeon: Marinus Maw, MD;  Location: [facility] INVASIVE CV LAB;  Service: Cardiovascular;  Laterality: N/A;  . NO PAST SURGERIES    . TEE WITHOUT CARDIOVERSION N/A 10/27/2017   Procedure: TRANSESOPHAGEAL ECHOCARDIOGRAM (TEE);  Surgeon: Pricilla Riffle, MD;  Location: [facility] ENDOSCOPY;  Service: Cardiovascular;  Laterality: N/A;     Home Medications:  Prior to Admission medications   Medication Sig Start Date End Date Taking? Authorizing Provider  acetaminophen (TYLENOL) 500 MG tablet Take 1,000 mg by mouth daily as needed for headache (pain).    [provider]  Ascorbic Acid (VITAMIN C) 1000 MG tablet Take 1,000 mg by mouth daily.    [provider]  aspirin 81 MG chewable tablet Chew 81 mg by mouth daily.     [provider]  Cholecalciferol (VITAMIN D3) 1.25 MG (50000 UT) CAPS Take 50,000 Units by mouth every Saturday.    [provider]  clopidogrel (PLAVIX) 75 MG tablet Take 1 tablet (75 mg total) by mouth daily. 10/29/17   Bloomfield, Carley D, DO  losartan (COZAAR) 100 MG tablet Take 100 mg by mouth daily.    [provider]  Multiple Vitamin (MULTIVITAMIN WITH MINERALS) TABS tablet Take 1 tablet by mouth daily.    [provider]  nitroGLYCERIN (NITROSTAT)  0.4 MG SL tablet Place 1 tablet (0.4 mg total) under the tongue every 5 (five) minutes as needed for chest pain. Patient not taking: Reported on 01/28/2020 03/09/18   Synetta Fail, MD  Omega-3 Fatty Acids (FISH OIL) 1200 MG CAPS Take 1,200 mg by mouth daily.     [provider]  Red Yeast Rice 600 MG TABS Take 600 mg by mouth daily.    [provider]  rosuvastatin (CRESTOR) 20 MG tablet Take 20 mg by mouth daily.    [provider]  senna (SENOKOT) 8.6 MG TABS tablet Take 1  tablet by mouth daily as needed for mild constipation.    [provider]  sertraline (ZOLOFT) 50 MG tablet Take 1 tablet (50 mg total) by mouth daily. 10/28/17   Bloomfield, Carley D, DO  triamterene-hydrochlorothiazide (MAXZIDE-25) 37.5-25 MG tablet Take 1 tablet by mouth daily. 02/21/19   Frederica Kuster, MD    Inpatient Medications: Scheduled Meds: . docusate  100 mg Per Tube BID  . etomidate      . fentaNYL (SUBLIMAZE) injection  25 mcg Intravenous Once  . polyethylene glycol  17 g Per Tube Daily  . rocuronium bromide      . succinylcholine       Continuous Infusions: . sodium chloride     Followed by  . sodium chloride    . dextrose 5% lactated ringers    . fentaNYL infusion INTRAVENOUS    . insulin    . lactated ringers    . lactated ringers     PRN Meds: dextrose, fentaNYL  Allergies:    No Known Allergies  Social History:   Social History   Socioeconomic History  . Marital status: Divorced    Spouse name: Not on file  . Number of children: Not on file  . Years of education: Not on file  . Highest education level: Not on file  Occupational History  . Not on file  Tobacco Use  . Smoking status: Former Smoker    Packs/day: 0.12    Years: 10.00    Pack years: 1.20    Types: Cigarettes, Pipe    Quit date: 1990    Years since quitting: 31.9  . Smokeless tobacco: Never Used  Vaping Use  . Vaping Use: Never used  Substance and Sexual Activity  . Alcohol use: Never  . Drug use: Never  . Sexual activity: Not on file  Other Topics Concern  . Not on file  Social History Narrative  . Not on file   Social Determinants of Health   Financial Resource Strain: Not on file  Food Insecurity: Not on file  Transportation Needs: Not on file  Physical Activity: Not on file  Stress: Not on file  Social Connections: Not on file  Intimate Partner Violence: Not on file     Family History:    Family History  Problem Relation Age of Onset  . CVA Mother  19  . Stroke Mother   . Heart attack Father        69  . CVA Father   . Stroke Father      ROS:  All other ROS reviewed and negative. Pertinent positives noted in the HPI.     Physical Exam/Data:   Vitals:   04/07/20 0900 04/07/20 1006  SpO2:  100%  Weight: 75.8 kg    No intake or output data in the 24 hours ending 04/07/20 1009  Last 3 Weights 04/07/2020 01/28/2020  12/30/2019  Weight (lbs) 167 lb 1.7 oz 175 lb 0.7 oz 175 lb  Weight (kg) 75.8 kg 79.4 kg 79.379 kg    Body mass index is 23.31 kg/m.  General: Ill-appearing, nonresponsive Head: Atraumatic, normal size  Eyes: No scleral icterus Neck: Supple, no JVD Endocrine: No thryomegaly Cardiac: Normal S1, S2; RRR; no murmurs, rubs, or gallops Lungs: Diminished breath sounds bilaterally Abd: Soft, nontender, no hepatomegaly  Ext: No edema, pulses 2+ Musculoskeletal: No deformities Skin: Warm and dry, no rashes   Neuro: Awake, not protecting airway, intubation performed by was in the room  EKG:  The EKG was personally reviewed and demonstrates: Sinus rhythm with atrial rate around 100 bpm, escape rhythm noted, complete heart block, complete A-V dissociation, he is in an escape rhythm with rate of 32 bpm, the skate rhythm is right bundle branch morphology with left anterior fascicular block Telemetry:  Telemetry was personally reviewed and demonstrates: Complete heart block  Relevant CV Studies:  Left heart cath 01/28/2020 -25% mid LAD, 25% RCA -Nonobstructive CAD  Echo 10/24/2017 EF 55 to 60%, no wall motion normalities  Laboratory Data: High Sensitivity Troponin:  No results for input(s): TROPONINIHS in the last 720 hours.   Cardiac EnzymesNo results for input(s): TROPONINI in the last 168 hours. No results for input(s): TROPIPOC in the last 168 hours.  Chemistry Recent Labs  Lab 04/07/20 0922 04/07/20 0952  NA 138 137  K 5.0 5.2*  CL 105  --   GLUCOSE 528*  --   BUN 47*  --   CREATININE 2.70*  --     No  results for input(s): PROT, ALBUMIN, AST, ALT, ALKPHOS, BILITOT in the last 168 hours. Hematology Recent Labs  Lab 04/07/20 0915 04/07/20 0922 04/07/20 0952  WBC 17.7*  --   --   RBC 5.09  --   --   HGB 14.7 15.6 15.0  HCT 47.3 46.0 44.0  MCV 92.9  --   --   MCH 28.9  --   --   MCHC 31.1  --   --   RDW 13.2  --   --   PLT 263  --   --    BNPNo results for input(s): BNP, PROBNP in the last 168 hours.  DDimer No results for input(s): DDIMER in the last 168 hours.  Radiology/Studies:  CT CERVICAL SPINE WO CONTRAST  Result Date: 04/07/2020 CLINICAL DATA:  74 year old male with a history of neurologic deficit EXAM: CT CERVICAL SPINE WITHOUT CONTRAST TECHNIQUE: Multidetector CT imaging of the cervical spine was performed without intravenous contrast. Multiplanar CT image reconstructions were also generated. COMPARISON:  None. FINDINGS: Alignment: Craniocervical junction aligned. Anatomic alignment of the cervical elements. No subluxation. Skull base and vertebrae: No acute fracture at the skullbase. Vertebral body heights relatively maintained. No acute fracture identified. Soft tissues and spinal canal: Unremarkable cervical soft tissues. Lymph nodes are present, though not enlarged. Disc levels: Disc space narrowing at C5-C6 and C6-C7 with endplate sclerosis, anterior osteophyte production, and uncovertebral joint disease. Less degree of joint space narrowing at C4-C5. Mild right foraminal narrowing at C6-C7 secondary to uncovertebral joint disease. Ankylosis of the right greater than left facets at C2-C3. Left-sided facet ankylosis at C4-C5. No significant bony canal narrowing. Upper chest: Unremarkable appearance of the lung apices. Other: No bony canal narrowing. IMPRESSION: Negative for acute fracture or malalignment of the cervical spine. Degenerative changes as above. Electronically Signed   By: Gilmer MorJaime  Wagner D.O.   On: 04/07/2020 09:41  CT HEAD CODE STROKE WO CONTRAST  Result Date:  04/07/2020 CLINICAL DATA:  Code stroke.  Left-sided weakness EXAM: CT HEAD WITHOUT CONTRAST TECHNIQUE: Contiguous axial images were obtained from the base of the skull through the vertex without intravenous contrast. COMPARISON:  02/20/2019 FINDINGS: Brain: There is no acute intracranial hemorrhage, mass effect, or edema. No new loss of gray-white differentiation. Multiple chronic infarcts are again identified within encephalomalacia/gliosis present in the parasagittal right frontal lobe, right parietal lobe, and left occipital lobe. There are also chronic small vessel infarcts of the left basal ganglia and adjacent white matter and left thalamus. Prominence of the ventricles and sulci reflects stable parenchymal volume loss. Additional patchy and confluent areas of hypoattenuation in the supratentorial white matter probably reflect stable chronic microvascular ischemic changes. Vascular: No hyperdense vessel. There is intracranial atherosclerotic calcification at the skull base. Skull: Unremarkable. Sinuses/Orbits: No acute abnormality. Other: Mastoid air cells are clear. IMPRESSION: No acute intracranial hemorrhage or evidence of acute infarction. Multiple chronic infarcts.  Chronic microvascular ischemic changes. These results were communicated to Dr. Iver Nestle at 9:38 am on 04/07/2020 by text page via the [facility] messaging system. Electronically Signed   By: Guadlupe Spanish M.D.   On: 04/07/2020 09:41    Assessment and Plan:   1. Complete Heart Block -Admitted with concern for stroke and altered mentation.  Significantly elevated blood glucose levels.  He is not acidotic.  His EKG shows right bundle branch block with left anterior fascicular block morphology of the QRS complexes.  He is in a ventricular escape rhythm in the 30 to 50 bpm range.  He is in complete heart block.  Blood pressure is stable. -I did discuss his case with electrophysiology.  We will plan to proceed for emergent temporary pacing wire.   Given his ongoing metabolic derangements and concerns for stroke he will need to be more stable.  The plan is for neurology to pursue MRI and then we will pursue temporary pacing wire after this. -Notified Deloris Sturdivent [phone]) by phone of critical condition  -Due to concerns for stroke and significant hyperglycemia, I would recommend admission to either the neuro ICU or medical ICU.  For questions or updates, please contact [facility] Please consult [email or web address] for contact info under   Signed, Gerri Spore T. Flora Lipps, MD [place]  [facility]  04/07/2020 10:09 AM

## 2020-04-08 DIAGNOSIS — I442 Atrioventricular block, complete: Principal | ICD-10-CM

## 2020-04-08 DIAGNOSIS — E119 Type 2 diabetes mellitus without complications: Secondary | ICD-10-CM

## 2020-04-08 DIAGNOSIS — J9601 Acute respiratory failure with hypoxia: Secondary | ICD-10-CM

## 2020-04-08 DIAGNOSIS — J9621 Acute and chronic respiratory failure with hypoxia: Secondary | ICD-10-CM

## 2020-04-08 DIAGNOSIS — N179 Acute kidney failure, unspecified: Secondary | ICD-10-CM

## 2020-04-08 LAB — BASIC METABOLIC PANEL
Anion gap: 12 (ref 5–15)
Anion gap: 8 (ref 5–15)
Anion gap: 9 (ref 5–15)
Anion gap: 9 (ref 5–15)
Anion gap: 9 (ref 5–15)
BUN: 27 mg/dL — ABNORMAL HIGH (ref 8–23)
BUN: 29 mg/dL — ABNORMAL HIGH (ref 8–23)
BUN: 32 mg/dL — ABNORMAL HIGH (ref 8–23)
BUN: 36 mg/dL — ABNORMAL HIGH (ref 8–23)
BUN: 41 mg/dL — ABNORMAL HIGH (ref 8–23)
CO2: 24 mmol/L (ref 22–32)
CO2: 25 mmol/L (ref 22–32)
CO2: 26 mmol/L (ref 22–32)
CO2: 27 mmol/L (ref 22–32)
CO2: 27 mmol/L (ref 22–32)
Calcium: 8.1 mg/dL — ABNORMAL LOW (ref 8.9–10.3)
Calcium: 8.7 mg/dL — ABNORMAL LOW (ref 8.9–10.3)
Calcium: 8.8 mg/dL — ABNORMAL LOW (ref 8.9–10.3)
Calcium: 9 mg/dL (ref 8.9–10.3)
Calcium: 9.4 mg/dL (ref 8.9–10.3)
Chloride: 108 mmol/L (ref 98–111)
Chloride: 108 mmol/L (ref 98–111)
Chloride: 109 mmol/L (ref 98–111)
Chloride: 109 mmol/L (ref 98–111)
Chloride: 112 mmol/L — ABNORMAL HIGH (ref 98–111)
Creatinine, Ser: 1.63 mg/dL — ABNORMAL HIGH (ref 0.61–1.24)
Creatinine, Ser: 1.7 mg/dL — ABNORMAL HIGH (ref 0.61–1.24)
Creatinine, Ser: 1.95 mg/dL — ABNORMAL HIGH (ref 0.61–1.24)
Creatinine, Ser: 2.03 mg/dL — ABNORMAL HIGH (ref 0.61–1.24)
Creatinine, Ser: 2.18 mg/dL — ABNORMAL HIGH (ref 0.61–1.24)
GFR, Estimated: 31 mL/min — ABNORMAL LOW (ref >60)
GFR, Estimated: 34 mL/min — ABNORMAL LOW (ref >60)
GFR, Estimated: 35 mL/min — ABNORMAL LOW (ref >60)
GFR, Estimated: 42 mL/min — ABNORMAL LOW (ref >60)
GFR, Estimated: 44 mL/min — ABNORMAL LOW (ref >60)
Glucose, Bld: 200 mg/dL — ABNORMAL HIGH (ref 70–99)
Glucose, Bld: 204 mg/dL — ABNORMAL HIGH (ref 70–99)
Glucose, Bld: 232 mg/dL — ABNORMAL HIGH (ref 70–99)
Glucose, Bld: 265 mg/dL — ABNORMAL HIGH (ref 70–99)
Glucose, Bld: 267 mg/dL — ABNORMAL HIGH (ref 70–99)
Potassium: 3.6 mmol/L (ref 3.5–5.1)
Potassium: 3.6 mmol/L (ref 3.5–5.1)
Potassium: 3.7 mmol/L (ref 3.5–5.1)
Potassium: 3.7 mmol/L (ref 3.5–5.1)
Potassium: 3.8 mmol/L (ref 3.5–5.1)
Sodium: 143 mmol/L (ref 135–145)
Sodium: 144 mmol/L (ref 135–145)
Sodium: 145 mmol/L (ref 135–145)
Sodium: 145 mmol/L (ref 135–145)
Sodium: 145 mmol/L (ref 135–145)

## 2020-04-08 LAB — BETA-HYDROXYBUTYRIC ACID: Beta-Hydroxybutyric Acid: 0.11 mmol/L (ref 0.05–0.27)

## 2020-04-08 LAB — GLUCOSE, CAPILLARY
Glucose-Capillary: 125 mg/dL — ABNORMAL HIGH (ref 70–99)
Glucose-Capillary: 129 mg/dL — ABNORMAL HIGH (ref 70–99)
Glucose-Capillary: 152 mg/dL — ABNORMAL HIGH (ref 70–99)
Glucose-Capillary: 164 mg/dL — ABNORMAL HIGH (ref 70–99)
Glucose-Capillary: 164 mg/dL — ABNORMAL HIGH (ref 70–99)
Glucose-Capillary: 165 mg/dL — ABNORMAL HIGH (ref 70–99)
Glucose-Capillary: 165 mg/dL — ABNORMAL HIGH (ref 70–99)
Glucose-Capillary: 167 mg/dL — ABNORMAL HIGH (ref 70–99)
Glucose-Capillary: 171 mg/dL — ABNORMAL HIGH (ref 70–99)
Glucose-Capillary: 180 mg/dL — ABNORMAL HIGH (ref 70–99)
Glucose-Capillary: 187 mg/dL — ABNORMAL HIGH (ref 70–99)
Glucose-Capillary: 189 mg/dL — ABNORMAL HIGH (ref 70–99)
Glucose-Capillary: 192 mg/dL — ABNORMAL HIGH (ref 70–99)
Glucose-Capillary: 198 mg/dL — ABNORMAL HIGH (ref 70–99)
Glucose-Capillary: 204 mg/dL — ABNORMAL HIGH (ref 70–99)
Glucose-Capillary: 205 mg/dL — ABNORMAL HIGH (ref 70–99)
Glucose-Capillary: 207 mg/dL — ABNORMAL HIGH (ref 70–99)
Glucose-Capillary: 209 mg/dL — ABNORMAL HIGH (ref 70–99)
Glucose-Capillary: 222 mg/dL — ABNORMAL HIGH (ref 70–99)

## 2020-04-08 LAB — POCT I-STAT 7, (LYTES, BLD GAS, ICA,H+H)
Acid-Base Excess: 3 mmol/L — ABNORMAL HIGH (ref 0.0–2.0)
Bicarbonate: 26.8 mmol/L (ref 20.0–28.0)
Calcium, Ion: 1.31 mmol/L (ref 1.15–1.40)
HCT: 36 % — ABNORMAL LOW (ref 39.0–52.0)
Hemoglobin: 12.2 g/dL — ABNORMAL LOW (ref 13.0–17.0)
O2 Saturation: 100 %
Patient temperature: 100.6
Potassium: 3.6 mmol/L (ref 3.5–5.1)
Sodium: 144 mmol/L (ref 135–145)
TCO2: 28 mmol/L (ref 22–32)
pCO2 arterial: 40.9 mmHg (ref 32.0–48.0)
pH, Arterial: 7.429 (ref 7.350–7.450)
pO2, Arterial: 179 mmHg — ABNORMAL HIGH (ref 83.0–108.0)

## 2020-04-08 LAB — PHOSPHORUS: Phosphorus: 1.9 mg/dL — ABNORMAL LOW (ref 2.5–4.6)

## 2020-04-08 LAB — MAGNESIUM: Magnesium: 2.6 mg/dL — ABNORMAL HIGH (ref 1.7–2.4)

## 2020-04-08 LAB — MRSA PCR SCREENING: MRSA by PCR: POSITIVE — AB

## 2020-04-08 MED ORDER — HYDRALAZINE HCL 20 MG/ML IJ SOLN
10.0000 mg | INTRAMUSCULAR | Status: DC | PRN
  Administered 2020-04-12: 10:00:00 20 mg via INTRAVENOUS
  Filled 2020-04-08 (×2): qty 1

## 2020-04-08 MED ORDER — FENTANYL CITRATE (PF) 100 MCG/2ML IJ SOLN
25.0000 ug | INTRAMUSCULAR | Status: AC | PRN
  Administered 2020-04-09 (×3): 25 ug via INTRAVENOUS
  Filled 2020-04-08 (×3): qty 2

## 2020-04-08 MED ORDER — SODIUM CHLORIDE 0.9% FLUSH
10.0000 mL | INTRAVENOUS | Status: DC | PRN
  Administered 2020-04-12: 10:00:00 10 mL

## 2020-04-08 MED ORDER — MUPIROCIN 2 % EX OINT
1.0000 "application " | TOPICAL_OINTMENT | Freq: Two times a day (BID) | CUTANEOUS | Status: AC
  Administered 2020-04-08 – 2020-04-12 (×10): 1 via NASAL
  Filled 2020-04-08 (×5): qty 22

## 2020-04-08 MED ORDER — SODIUM CHLORIDE 0.9% FLUSH
10.0000 mL | Freq: Two times a day (BID) | INTRAVENOUS | Status: DC
  Administered 2020-04-08 – 2020-04-19 (×15): 10 mL

## 2020-04-08 MED ORDER — FENTANYL CITRATE (PF) 100 MCG/2ML IJ SOLN
25.0000 ug | INTRAMUSCULAR | Status: DC | PRN
  Administered 2020-04-08: 100 ug via INTRAVENOUS
  Administered 2020-04-10: 50 ug via INTRAVENOUS
  Filled 2020-04-08 (×2): qty 2

## 2020-04-08 NOTE — Progress Notes (Signed)
NAME:  Jordan [name], MRN:  [id number], DOB:  March 18, 1946, LOS: 1 ADMISSION DATE:  04/07/2020, CONSULTATION DATE:  04/07/20 REFERRING MD:  Particia NearingHaviland, CHIEF COMPLAINT:  stroke   Brief History:  Jordan [name] is a 74 yo M w/ PMH of Dm, HTN, CKD, CAD [facility] being [facility]. Found to be altered. Intubated for altered mental status  History of Present Illness:  Jordan [name] is a 74 yo M w/ PMH listed below [facility] being [facility] down at home. On arrival he was noted to be altered, minimally responsive with inability to follow commands. He is unable to provide history.  History obtained via chart review. EMS pushed emergency call from home around midnight. Patient was unable to be accessed by EMS. Police was called and he was found him at home down for unknown duration. He was brought in as code stroke due to left neglect but was found to be bradycardic with HR in 30s. He was given atropine, cardiology was consulted and intubated. He was also found to be in HHS with cbg >500. Insulin gtt started and PCCM consulted for admission.  Past Medical History:   Past Medical History:  Diagnosis Date   BPH (benign prostatic hyperplasia)    CVA (cerebral vascular accident) (HCC) 10/23/2017   CT confirms subacute R parietal infarct   GERD (gastroesophageal reflux disease)    High cholesterol    Hypertension    Significant Hospital Events:  04/07/20 [facility] to [facility]  Consults:  Neurology, Cardiology  Procedures:  EEG 12/17 >>  Temporary pacemaker placement 12/17 >>   Significant Diagnostic Tests:  04/07/20 CT CERVICAL SPINE WO CONTRAST IMPRESSION: Negative for acute fracture or malalignment of the cervical spine. Degenerative changes as above.  04/07/20 CT Head IMPRESSION: No acute intracranial hemorrhage or evidence of acute infarction. Multiple chronic infarcts.  Chronic microvascular ischemic changes.  04/07/20 MR Brain IMPRESSION: No acute infarction. No  proximal intracranial vessel occlusion.  EEG 12/17 Moderate to severe diffuse encephalopathy without any epileptiform discharges or seizure activity (on propofol)  Echocardiogram 12/17 LVEF 60-65%, normal function.  No regional wall motion abnormality.  Mild concentric LVH.  RV size and function normal   Micro Data:  12/17 COVID, FLU neg  Antimicrobials:    Interim History / Subjective:   EEG as above Respiratory alkalosis on evening ABG, tidal volumes to 7 cc/kg Insulin infusion to Propofol 15 Fentanyl 100 CBG range 164-222 I/O+ 2.3 L total He does not have an underlying rhythm when challenged with decreased pacing   Objective   Blood pressure (!) 142/55, pulse (!) 59, temperature 98.4 F (36.9 C), temperature source Axillary, resp. rate (!) 33, height 5\' 11"  (1.803 m), weight 74.8 kg, SpO2 100 %.    Vent Mode: PRVC FiO2 (%):  [40 %-100 %] 40 % Set Rate:  [12 bmp-18 bmp] 12 bmp Vt Set:  [500 mL-600 mL] 500 mL PEEP:  [5 cmH20] 5 cmH20 Plateau Pressure:  [14 cmH20-18 cmH20] 14 cmH20   Intake/Output Summary (Last 24 hours) at 04/08/2020 0732 Last data filed at 04/08/2020 0700 Gross per 24 hour  Intake 3807.57 ml  Output 1490 ml  Net 2317.57 ml   Filed Weights   04/07/20 0900 04/08/20 0300  Weight: 75.8 kg 74.8 kg    Examination:  Gen: Intubated, lightly sedated, calm HEENT: ET tube in place, poor dentition, pupils equal Neck: No JVD CV: Paced rhythm 60, distant, no murmur Pulm: Clear bilaterally, no wheezing, no crackles Abd: Nondistended,  positive bowel sounds Extm: No lower extremity edema Skin: Warm, dry, no rash Neuro: Opens eyes to voice, nodded to questions, follow commands, globally weak  Resolved Hospital Problem list     Assessment & Plan:   #HHS Last known hgb a1c in pre-diabetes range at 6.4. Not on diabetic meds at home. Admit cbg 540. Gap of 20. Elevated serum osm at 343 with no acidosis (pH 7.37) Consistent with HHS. Started on insulin  gtt in ED. -Anion gap, CO2, CBG all improved.  Work on transitioning to sliding scale on 12/18 -Aggressive IV fluids until able to transition off insulin infusion -N.p.o., hold nutrition until glycemic control achieved -Follow frequent BMP, replace electrolytes if indicated  #Complete Heart Block 3rd degree heart block on EKG. Cardiology on board. Pacers placed. Given atropine in ED.  -Appreciate cardiology assistance, management.  Temporary pacemaker in place.  He does not have an underlying rhythm when his temporary pacemaker is held.  EP will see him today.  I suspect he will need a permanent pacemaker either this weekend or Monday. -Follow telemetry -Hold all rate controlling medications, beta-blocker, etc.  #Acute encephalopathy due to above Likely multi-factorial due to HHS + symptomatic bradycardia. Intubated and sedated with fentanyl gtt. ABG showing no evidence of respiratory distress. Mild resp alkalosis due to tachypnea -Correct metabolic disarray -Minimize sedating medications especially leading up to spontaneous breathing trial  #Acute respiratory failure, principally due to encephalopathy, altered mental status and acidosis -PRVC 7 cc/kg. -VAP prevention order set -Assess for spontaneous breathing trials once metabolic status improved, mental status improved, goal 12/18 unless there are plans for permanent pacemaker placement 12/18 if that is the case then will leave him intubated.  #Acute Kidney Injury on Chronic Kidney Disease Due to dehydration. Admit creatinine 3.06. Baseline 1.3-1.4.  Improving -Continue aggressive fluid resuscitation -Follow urine output, BMP -Avoid nephrotoxins -Renal dose medications  #CAD Cath in 01/2020 with non-obstructive cad -Plan to resume rosuvastatin, aspirin  Best practice (evaluated daily)  Diet: NPO Pain/Anxiety/Delirium protocol (if indicated): fentanyl, precedex VAP protocol (if indicated): Y DVT prophylaxis: subqhep GI  prophylaxis: PPI Glucose control: SSI Mobility: BR Disposition:ICU  Goals of Care:  Last date of multidisciplinary goals of care discussion: N/A Family and staff present: N/A Summary of discussion: N/A Follow up goals of care discussion due: 04/08/20 Code Status: FUll  Labs   CBC: Recent Labs  Lab 04/07/20 0915 04/07/20 0922 04/07/20 0952 04/07/20 1116 04/07/20 1907 04/07/20 2336 04/08/20 0259  WBC 17.7*  --   --   --  16.1*  --   --   NEUTROABS 14.3*  --   --   --   --   --   --   HGB 14.7   < > 15.0 13.6 13.2 12.2* 12.2*  HCT 47.3   < > 44.0 40.0 41.1 36.0* 36.0*  MCV 92.9  --   --   --  89.7  --   --   PLT 263  --   --   --  235  --   --    < > = values in this interval not displayed.    Basic Metabolic Panel: Recent Labs  Lab 04/07/20 0915 04/07/20 0922 04/07/20 0952 04/07/20 1907 04/07/20 2335 04/07/20 2336 04/08/20 0259 04/08/20 0439  NA 138 138   < > 147* 145 144 144 145  K 5.2* 5.0   < > 3.8 3.8 3.7 3.6 3.7  CL 100 105  --  110 108  --   --  109  CO2 21*  --   --  24 25  --   --  27  GLUCOSE 542* 528*  --  113* 265*  --   --  232*  BUN 44* 47*  --  41* 41*  --   --  36*  CREATININE 3.06* 2.70*  --  2.19* 2.18*  --   --  2.03*  CALCIUM 9.9  --   --  9.6 9.4  --   --  9.0  MG  --   --   --   --   --   --   --  2.6*  PHOS  --   --   --   --   --   --   --  1.9*   < > = values in this interval not displayed.   GFR: Estimated Creatinine Clearance: 33.8 mL/min (A) (by C-G formula based on SCr of 2.03 mg/dL (H)). Recent Labs  Lab 04/07/20 0915 04/07/20 1907  WBC 17.7* 16.1*    Liver Function Tests: Recent Labs  Lab 04/07/20 0915  AST 39  ALT 34  ALKPHOS 59  BILITOT 1.2  PROT 7.3  ALBUMIN 3.3*   No results for input(s): LIPASE, AMYLASE in the last 168 hours. Recent Labs  Lab 04/07/20 1907  AMMONIA 28    ABG    Component Value Date/Time   PHART 7.429 04/08/2020 0259   PCO2ART 40.9 04/08/2020 0259   PO2ART 179 (H) 04/08/2020 0259    HCO3 26.8 04/08/2020 0259   TCO2 28 04/08/2020 0259   ACIDBASEDEF 2.0 04/07/2020 1116   O2SAT 100.0 04/08/2020 0259     Coagulation Profile: Recent Labs  Lab 04/07/20 0947  INR 1.2    Cardiac Enzymes: Recent Labs  Lab 04/07/20 0915  CKTOTAL 3,471*    HbA1C: Hgb A1c MFr Bld  Date/Time Value Ref Range Status  04/07/2020 09:15 AM 11.9 (H) 4.8 - 5.6 % Final    Comment:    (NOTE) Pre diabetes:          5.7%-6.4%  Diabetes:              >6.4%  Glycemic control for   <7.0% adults with diabetes   10/24/2017 03:57 AM 6.4 (H) 4.8 - 5.6 % Final    Comment:    (NOTE) Pre diabetes:          5.7%-6.4% Diabetes:              >6.4% Glycemic control for   <7.0% adults with diabetes     CBG: Recent Labs  Lab 04/08/20 0230 04/08/20 0336 04/08/20 0448 04/08/20 0559 04/08/20 0712  GLUCAP 222* 207* 204* 187* 164*    Review of Systems:   Unable to assess  Past Medical History:  He,  has a past medical history of BPH (benign prostatic hyperplasia), CVA (cerebral vascular accident) (HCC) (10/23/2017), GERD (gastroesophageal reflux disease), High cholesterol, and Hypertension.   Surgical History:   Past Surgical History:  Procedure Laterality Date   LEFT HEART CATH AND CORONARY ANGIOGRAPHY N/A 01/28/2020   Procedure: LEFT HEART CATH AND CORONARY ANGIOGRAPHY;  Surgeon: Laurier Nancy, MD;  Location: [facility] INVASIVE CV LAB;  Service: Cardiovascular;  Laterality: N/A;   LOOP RECORDER INSERTION N/A 10/27/2017   Procedure: LOOP RECORDER INSERTION;  Surgeon: Marinus Maw, MD;  Location: [facility] INVASIVE CV LAB;  Service: Cardiovascular;  Laterality: N/A;   NO PAST SURGERIES     TEE WITHOUT CARDIOVERSION N/A 10/27/2017  Procedure: TRANSESOPHAGEAL ECHOCARDIOGRAM (TEE);  Surgeon: Pricilla Riffle, MD;  Location: [facility] ENDOSCOPY;  Service: Cardiovascular;  Laterality: N/A;     Social History:   reports that he quit smoking about 31 years ago. His smoking use included cigarettes and pipe.  He has a 1.20 pack-year smoking history. He has never used smokeless tobacco. He reports that he does not drink alcohol and does not use drugs.   Family History:  His family history includes CVA in his father; CVA (age of onset: 72) in his mother; Heart attack in his father; Stroke in his father and mother.   Allergies No Known Allergies   Home Medications  Prior to Admission medications   Medication Sig Start Date End Date Taking? Authorizing Provider  acetaminophen (TYLENOL) 500 MG tablet Take 1,000 mg by mouth daily as needed for headache (pain).    [provider]  Apple Cid Vn-Grn Tea-Bit Or-Cr (APPLE CIDER VINEGAR PLUS) TABS Take 1-2 tablets by mouth daily.    [provider]  Ascorbic Acid (VITAMIN C) 1000 MG tablet Take 1,000 mg by mouth daily.    [provider]  aspirin 81 MG chewable tablet Chew 81 mg by mouth daily.     [provider]  Cholecalciferol (VITAMIN D3) 1.25 MG (50000 UT) CAPS Take 50,000 Units by mouth every Saturday.    [provider]  clopidogrel (PLAVIX) 75 MG tablet Take 1 tablet (75 mg total) by mouth daily. 10/29/17   Bloomfield, Carley D, DO  GARLIC PO Take 1 tablet by mouth daily.    [provider]  losartan (COZAAR) 100 MG tablet Take 100 mg by mouth daily.    [provider]  Multiple Vitamin (MULTIVITAMIN WITH MINERALS) TABS tablet Take 1 tablet by mouth daily.    [provider]  nitroGLYCERIN (NITROSTAT) 0.4 MG SL tablet Place 1 tablet (0.4 mg total) under the tongue every 5 (five) minutes as needed for chest pain. Patient not taking: Reported on 01/28/2020 03/09/18   Synetta Fail, MD  Omega-3 Fatty Acids (FISH OIL) 1200 MG CAPS Take 1,200 mg by mouth daily.     [provider]  Red Yeast Rice 600 MG TABS Take 600 mg by mouth daily.    [provider]  rosuvastatin (CRESTOR) 20 MG tablet Take 20 mg by mouth daily.    [provider]  senna (SENOKOT)  8.6 MG TABS tablet Take 1 tablet by mouth daily as needed for mild constipation.    [provider]  sertraline (ZOLOFT) 50 MG tablet Take 1 tablet (50 mg total) by mouth daily. 10/28/17   Bloomfield, Carley D, DO  triamterene-hydrochlorothiazide (MAXZIDE-25) 37.5-25 MG tablet Take 1 tablet by mouth daily. 02/21/19   Frederica Kuster, MD    Critical care time: 85 min      Levy Pupa, MD, PhD 04/08/2020, 7:50 AM [facility] [phone] or if no answer [phone]

## 2020-04-08 NOTE — Progress Notes (Signed)
eLink Physician-Brief Progress Note Patient Name: BRODAN GREWELL DOB: [birth year] MRN: [id number]   Date of Service  04/08/2020  HPI/Events of Note  ABG: 7.55/ 29/ 170/ 25.7  eICU Interventions  Vt reduced to 7 ml / kg from 8 ml  / kg, repeat ABG ordered for 3 :00 a.m.        Thomasene Lot Rekha Hobbins 04/08/2020, 12:11 AM

## 2020-04-08 NOTE — Progress Notes (Signed)
Inpatient Diabetes Program Recommendations  AACE/ADA: New Consensus Statement on Inpatient Glycemic Control   Target Ranges:  Prepandial:   less than 140 mg/dL      Peak postprandial:   less than 180 mg/dL (1-2 hours)      Critically ill patients:  140 - 180 mg/dL  Results for PRATHER, FAILLA (MRN [id number]) as of 04/08/2020 11:56  Ref. Range 04/08/2020 00:24 04/08/2020 02:30 04/08/2020 03:36 04/08/2020 04:48 04/08/2020 05:59 04/08/2020 07:12 04/08/2020 09:46  Glucose-Capillary Latest Ref Range: 70 - 99 mg/dL 350 (H) 093 (H) 818 (H) 204 (H) 187 (H) 164 (H) 205 (H)   Results for KANYON, SEIBOLD (MRN [id number]) as of 04/08/2020 11:56  Ref. Range 04/07/2020 09:15  Glucose Latest Ref Range: 70 - 99 mg/dL 789 (HH)  Hemoglobin F8B Latest Ref Range: 4.8 - 5.6 % 11.9 (H)   Review of Glycemic Control  Diabetes history: Unknown;  A1C 6.4% 10/24/2017 Outpatient Diabetes medications: None Current orders for Inpatient glycemic control: IV insulin  Inpatient Diabetes Program Recommendations:    Insulin: Once MD is ready to transition from IV to SQ insulin, please consider ordering Levemir 10 units BID, CBGs Q4H, Novolog 0-9 units Q4H.  NOTE: Noted consult for Diabetes Coordinator. Diabetes Coordinator is not on campus over the weekend but available by pager from 8am to 5pm for questions or concerns. Chart reviewed. Unknown DM hx but noted A1C of 6.4% on 10/24/17. Initial glucose 542 mg/dl on 01/75/10 and patient was started on IV insulin which is currently at 1.7 units/hr.   Thanks, Orlando Penner, RN, MSN, CDE Diabetes Coordinator Inpatient Diabetes Program [phone] (Team Pager from 8am to 5pm)

## 2020-04-08 NOTE — Progress Notes (Signed)
Cardiology Progress Note  Patient ID: Jordan [name] MRN: [id number] DOB: 24-May-1945 Date of Encounter: 04/08/2020  Primary Cardiologist: No primary care provider on file.  Subjective   Chief Complaint: Intubated on vent.  HPI: Intubated on vent.  Will follow commands per nursing.  Temporary pacer working fine paced at 60.  Appears to be in ventricular standstill.  Capture was lost around 0.8 mA.  At this time e.g. showed sinus rhythm with no underlying ventricular rhythm.  ROS:  All other ROS reviewed and negative. Pertinent positives noted in the HPI.     Inpatient Medications  Scheduled Meds: . aspirin  81 mg Per Tube Daily  . chlorhexidine gluconate (MEDLINE KIT)  15 mL Mouth Rinse BID  . Chlorhexidine Gluconate Cloth  6 each Topical Daily  . docusate  100 mg Per Tube BID  . heparin  5,000 Units Subcutaneous Q8H  . mouth rinse  15 mL Mouth Rinse 10 times per day  . mupirocin ointment  1 application Nasal BID  . polyethylene glycol  17 g Per Tube Daily  . rosuvastatin  20 mg Per Tube Daily  . sodium chloride flush  10-40 mL Intracatheter Q12H   Continuous Infusions: . dextrose 5% lactated ringers 125 mL/hr at 04/08/20 0658  . fentaNYL infusion INTRAVENOUS 100 mcg/hr (04/07/20 2122)  . insulin 2.2 mL/hr at 04/08/20 0658  . lactated ringers 125 mL/hr at 04/07/20 1552  . propofol (DIPRIVAN) infusion 15 mcg/kg/min (04/08/20 0658)   PRN Meds: dextrose, fentaNYL, sodium chloride flush   Vital Signs   Vitals:   04/08/20 0500 04/08/20 0600 04/08/20 0700 04/08/20 0737  BP: (!) 118/41 114/77 (!) 142/55   Pulse: (!) 58 (!) 58 (!) 59   Resp: (!) 36 (!) 27 (!) 33   Temp:    99.8 F (37.7 C)  TempSrc:    Oral  SpO2: 100% 100% 100%   Weight:      Height:        Intake/Output Summary (Last 24 hours) at 04/08/2020 0834 Last data filed at 04/08/2020 0700 Gross per 24 hour  Intake 3807.57 ml  Output 1490 ml  Net 2317.57 ml   Last 3 Weights 04/08/2020 04/07/2020 01/28/2020   Weight (lbs) 164 lb 14.5 oz 167 lb 1.7 oz 175 lb 0.7 oz  Weight (kg) 74.8 kg 75.8 kg 79.4 kg      Telemetry  Overnight telemetry shows V paced rhythm 60 bpm, which I personally reviewed.   ECG  The most recent ECG shows complete heart block, ventricular escape rhythm 32, which I personally reviewed.   Physical Exam   Vitals:   04/08/20 0500 04/08/20 0600 04/08/20 0700 04/08/20 0737  BP: (!) 118/41 114/77 (!) 142/55   Pulse: (!) 58 (!) 58 (!) 59   Resp: (!) 36 (!) 27 (!) 33   Temp:    99.8 F (37.7 C)  TempSrc:    Oral  SpO2: 100% 100% 100%   Weight:      Height:         Intake/Output Summary (Last 24 hours) at 04/08/2020 0834 Last data filed at 04/08/2020 0700 Gross per 24 hour  Intake 3807.57 ml  Output 1490 ml  Net 2317.57 ml    Last 3 Weights 04/08/2020 04/07/2020 01/28/2020  Weight (lbs) 164 lb 14.5 oz 167 lb 1.7 oz 175 lb 0.7 oz  Weight (kg) 74.8 kg 75.8 kg 79.4 kg    Body mass index is 23 kg/m.  General: Intubated on vent Head:  Atraumatic, normal size  Eyes: PEERLA, EOMI  Neck: Supple, no JVD Endocrine: No thryomegaly Cardiac: Normal S1, S2; RRR; no murmurs, rubs, or gallops Lungs: Diminished breath sounds bilaterally Abd: Soft, nontender, no hepatomegaly  Ext: No edema, pulses 2+ Musculoskeletal: No deformities Skin: Warm and dry, no rashes   Neuro: Intubated on vent  Labs  High Sensitivity Troponin:  No results for input(s): TROPONINIHS in the last 720 hours.   Cardiac EnzymesNo results for input(s): TROPONINI in the last 168 hours. No results for input(s): TROPIPOC in the last 168 hours.  Chemistry Recent Labs  Lab 04/07/20 0915 04/07/20 8144 04/07/20 1907 04/07/20 2335 04/07/20 2336 04/08/20 0259 04/08/20 0439  NA 138   < > 147* 145 144 144 145  K 5.2*   < > 3.8 3.8 3.7 3.6 3.7  CL 100   < > 110 108  --   --  109  CO2 21*  --  24 25  --   --  27  GLUCOSE 542*   < > 113* 265*  --   --  232*  BUN 44*   < > 41* 41*  --   --  36*  CREATININE  3.06*   < > 2.19* 2.18*  --   --  2.03*  CALCIUM 9.9  --  9.6 9.4  --   --  9.0  PROT 7.3  --   --   --   --   --   --   ALBUMIN 3.3*  --   --   --   --   --   --   AST 39  --   --   --   --   --   --   ALT 34  --   --   --   --   --   --   ALKPHOS 59  --   --   --   --   --   --   BILITOT 1.2  --   --   --   --   --   --   GFRNONAA 21*  --  31* 31*  --   --  34*  ANIONGAP 17*  --  13 12  --   --  9   < > = values in this interval not displayed.    Hematology Recent Labs  Lab 04/07/20 0915 04/07/20 0922 04/07/20 1907 04/07/20 2336 04/08/20 0259  WBC 17.7*  --  16.1*  --   --   RBC 5.09  --  4.58  --   --   HGB 14.7   < > 13.2 12.2* 12.2*  HCT 47.3   < > 41.1 36.0* 36.0*  MCV 92.9  --  89.7  --   --   MCH 28.9  --  28.8  --   --   MCHC 31.1  --  32.1  --   --   RDW 13.2  --  13.3  --   --   PLT 263  --  235  --   --    < > = values in this interval not displayed.   BNPNo results for input(s): BNP, PROBNP in the last 168 hours.  DDimer No results for input(s): DDIMER in the last 168 hours.   Radiology  EEG  Result Date: 04/07/2020 Lora Havens, MD     04/07/2020  5:22 PM Patient Name: Jordan [name] MRN: [id number] Epilepsy Attending: Lora Havens  Referring Physician/Provider: Dr Lesleigh Noe Date: 04/07/2020 Duration: 23.43 mins Patient history: 74yo M with ams. EEG to evaluate for seizure. Level of alertness:  comatose AEDs during EEG study: Propofol Technical aspects: This EEG study was done with scalp electrodes positioned according to the 10-20 International system of electrode placement. Electrical activity was acquired at a sampling rate of '500Hz'  and reviewed with a high frequency filter of '70Hz'  and a low frequency filter of '1Hz' . EEG data were recorded continuously and digitally stored. Description: EEG showed continuous generalized 3 to 6 Hz theta-delta slowing. Hyperventilation and photic stimulation were not performed.   ABNORMALITY - Continuous slow, generalized  IMPRESSION: This study is suggestive of moderate to severe diffuse encephalopathy. No seizures or epileptiform discharges were seen throughout the recording. Lora Havens   CT CERVICAL SPINE WO CONTRAST  Result Date: 04/07/2020 CLINICAL DATA:  74 year old male with a history of neurologic deficit EXAM: CT CERVICAL SPINE WITHOUT CONTRAST TECHNIQUE: Multidetector CT imaging of the cervical spine was performed without intravenous contrast. Multiplanar CT image reconstructions were also generated. COMPARISON:  None. FINDINGS: Alignment: Craniocervical junction aligned. Anatomic alignment of the cervical elements. No subluxation. Skull base and vertebrae: No acute fracture at the skullbase. Vertebral body heights relatively maintained. No acute fracture identified. Soft tissues and spinal canal: Unremarkable cervical soft tissues. Lymph nodes are present, though not enlarged. Disc levels: Disc space narrowing at C5-C6 and C6-C7 with endplate sclerosis, anterior osteophyte production, and uncovertebral joint disease. Less degree of joint space narrowing at C4-C5. Mild right foraminal narrowing at C6-C7 secondary to uncovertebral joint disease. Ankylosis of the right greater than left facets at C2-C3. Left-sided facet ankylosis at C4-C5. No significant bony canal narrowing. Upper chest: Unremarkable appearance of the lung apices. Other: No bony canal narrowing. IMPRESSION: Negative for acute fracture or malalignment of the cervical spine. Degenerative changes as above. Electronically Signed   By: Corrie Mckusick D.O.   On: 04/07/2020 09:41   MR ANGIO HEAD WO CONTRAST  Result Date: 04/07/2020 CLINICAL DATA:  Code stroke follow-up EXAM: MRI HEAD WITHOUT CONTRAST MRA HEAD WITHOUT CONTRAST TECHNIQUE: Multiplanar, multiecho pulse sequences of the brain and surrounding structures were obtained without intravenous contrast. Angiographic images of the head were obtained using MRA technique without contrast. COMPARISON:   None. FINDINGS: MRI HEAD Axial DWI and T2 FLAIR sequences were obtained. There is no acute infarction. Prominence of the ventricles and sulci reflects generalized parenchymal volume loss. 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 There is no hydrocephalus or extra-axial collection. MRA HEAD Intracranial internal carotid arteries are patent. Middle and anterior cerebral arteries are patent. Intracranial vertebral arteries, basilar artery, posterior cerebral arteries are patent. Bilateral posterior communicating arteries are present. There is no significant stenosis or aneurysm. IMPRESSION: No acute infarction. No proximal intracranial vessel occlusion. Electronically Signed   By: Macy Mis M.D.   On: 04/07/2020 11:13   MR BRAIN WO CONTRAST  Result Date: 04/07/2020 CLINICAL DATA:  Code stroke follow-up EXAM: MRI HEAD WITHOUT CONTRAST MRA HEAD WITHOUT CONTRAST TECHNIQUE: Multiplanar, multiecho pulse sequences of the brain and surrounding structures were obtained without intravenous contrast. Angiographic images of the head were obtained using MRA technique without contrast. COMPARISON:  None. FINDINGS: MRI HEAD Axial DWI and T2 FLAIR sequences were obtained. There is no acute infarction. Prominence of the ventricles and sulci reflects generalized parenchymal volume loss. Chronic infarcts are identified  in the parasagittal right frontal lobe, right parietal lobe, left occipital lobe, left thalamus, and left basal ganglia and adjacent white matter. There is no hydrocephalus or extra-axial collection. MRA HEAD Intracranial internal carotid arteries are patent. Middle and anterior cerebral arteries are patent. Intracranial vertebral arteries, basilar artery, posterior cerebral arteries are patent. Bilateral posterior communicating arteries are present. There is no significant  stenosis or aneurysm. IMPRESSION: No acute infarction. No proximal intracranial vessel occlusion. Electronically Signed   By: Macy Mis M.D.   On: 04/07/2020 11:13   CARDIAC CATHETERIZATION  Result Date: 04/07/2020 Successful placement of temporary transvenous pacemaker via right jugular access. Plan: check portable CXR for line placement. Will leave at rate 60 bpm, 5 mA output. Capture threshold < 0.1 mA  DG Chest Portable 1 View  Result Date: 04/07/2020 CLINICAL DATA:  Unresponsive.  Intubation. EXAM: PORTABLE CHEST 1 VIEW COMPARISON:  Chest x-ray dated December 30, 2019. FINDINGS: The patient is rotated to the right. Endotracheal tube tip approximately 1.9 cm above the carina. Enteric tube entering the stomach with the tip below the field of view. The heart size and mediastinal contours are within normal limits. Loop recorder again noted. No focal consolidation, pleural effusion, or pneumothorax. No acute osseous abnormality. IMPRESSION: 1. Appropriately positioned endotracheal tube.  No active disease. Electronically Signed   By: Titus Dubin M.D.   On: 04/07/2020 12:17   DG Chest Port 1V same Day  Result Date: 04/07/2020 CLINICAL DATA:  Confirm pacer placement. EXAM: PORTABLE CHEST 1 VIEW COMPARISON:  April 07, 2020 FINDINGS: There is stable endotracheal tube and nasogastric tube positioning. Interval right internal jugular cardiac pacer lead placement is seen with appropriate lead tip positioning seen overlying the left lung base. The heart size and mediastinal contours are within normal limits. Both lungs are clear. The visualized skeletal structures are unremarkable. IMPRESSION: Interval right internal jugular cardiac pacer placement positioning, as described above, without evidence of acute or active cardiopulmonary disease. Electronically Signed   By: Virgina Norfolk M.D.   On: 04/07/2020 19:20   DG Abd Portable 1 View  Result Date: 04/07/2020 CLINICAL DATA:  Unresponsive.   Enteric tube placement. EXAM: PORTABLE ABDOMEN - 1 VIEW COMPARISON:  None. FINDINGS: Enteric tube in the stomach. The bowel gas pattern is normal. No radio-opaque calculi or other significant radiographic abnormality are seen. No acute osseous abnormality. Bilateral hip osteoarthritis. IMPRESSION: 1. Enteric tube in the stomach. Electronically Signed   By: Titus Dubin M.D.   On: 04/07/2020 12:18   ECHOCARDIOGRAM COMPLETE  Result Date: 04/07/2020    ECHOCARDIOGRAM REPORT   Patient Name:   Jordan [name] Date of Exam: 04/07/2020 Medical Rec #:  [id number]    Height:       71.0 in Accession #:    [id number]   Weight:       167.1 lb Date of Birth:  1945/11/25   BSA:          1.954 m Patient Age:    8 years     BP:           131/40 mmHg Patient Gender: M            HR:           36 bpm. Exam Location:  Inpatient Procedure: 2D Echo, Color Doppler, Cardiac Doppler and Intracardiac            Opacification Agent Indications:    Heart block  History:  Patient has prior history of Echocardiogram examinations, most                 recent 10/27/2017. Risk Factors:Sleep Apnea, Former Smoker,                 Hypertension and Dyslipidemia. GERD.  Sonographer:    Clayton Lefort RDCS (AE) Referring Phys: [id number] [place]  Sonographer Comments: Echo performed with patient supine and on artificial respirator. Limited patient mobility. IMPRESSIONS  1. Left ventricular ejection fraction, by estimation, is 60 to 65%. The left ventricle has normal function. The left ventricle has no regional wall motion abnormalities. There is mild concentric left ventricular hypertrophy. Left ventricular diastolic function could not be evaluated.  2. Right ventricular systolic function is normal. The right ventricular size is normal.  3. The mitral valve is grossly normal. Trivial mitral valve regurgitation.  4. The aortic valve is tricuspid. Aortic valve regurgitation is not visualized. No aortic stenosis is present.  Conclusion(s)/Recommendation(s): In heart block throughout study, with ventricular rate in the 30s. Per EMR, teams are aware and managing heart block. FINDINGS  Left Ventricle: Left ventricular ejection fraction, by estimation, is 60 to 65%. The left ventricle has normal function. The left ventricle has no regional wall motion abnormalities. Definity contrast agent was given IV to delineate the left ventricular  endocardial borders. The left ventricular internal cavity size was normal in size. There is mild concentric left ventricular hypertrophy. Left ventricular diastolic function could not be evaluated. Right Ventricle: The right ventricular size is normal. Right vetricular wall thickness was not well visualized. Right ventricular systolic function is normal. Left Atrium: Left atrial size was normal in size. Right Atrium: Right atrial size was normal in size. Pericardium: There is no evidence of pericardial effusion. Mitral Valve: The mitral valve is grossly normal. There is mild thickening of the mitral valve leaflet(s). There is mild calcification of the mitral valve leaflet(s). Trivial mitral valve regurgitation. Tricuspid Valve: The tricuspid valve is normal in structure. Tricuspid valve regurgitation is trivial. No evidence of tricuspid stenosis. Aortic Valve: The aortic valve is tricuspid. Aortic valve regurgitation is not visualized. No aortic stenosis is present. Pulmonic Valve: The pulmonic valve was grossly normal. Pulmonic valve regurgitation is trivial. No evidence of pulmonic stenosis. Aorta: The aortic root and ascending aorta are structurally normal, with no evidence of dilitation. Venous: IVC assessment for right atrial pressure unable to be performed due to mechanical ventilation. IAS/Shunts: The atrial septum is grossly normal.  LEFT VENTRICLE PLAX 2D LVIDd:         3.25 cm LVIDs:         2.25 cm LV PW:         1.45 cm LV IVS:        1.25 cm LVOT diam:     1.60 cm LVOT Area:     2.01 cm  RIGHT  VENTRICLE RV Basal diam:  3.20 cm RV S prime:     12.60 cm/s TAPSE (M-mode): 2.1 cm LEFT ATRIUM             Index       RIGHT ATRIUM           Index LA diam:        2.80 cm 1.43 cm/m  RA Area:     11.50 cm LA Vol (A2C):   47.5 ml 24.31 ml/m RA Volume:   22.20 ml  11.36 ml/m LA Vol (A4C):   27.5 ml 14.08 ml/m  LA Biplane Vol: 36.5 ml 18.68 ml/m   AORTA Ao Root diam: 3.10 cm Ao Asc diam:  2.50 cm TRICUSPID VALVE TR Peak grad:   15.4 mmHg TR Vmax:        196.00 cm/s  SHUNTS Systemic Diam: 1.60 cm Buford Dresser MD Electronically signed by Buford Dresser MD Signature Date/Time: 04/07/2020/4:27:00 PM    Final    CT HEAD CODE STROKE WO CONTRAST  Result Date: 04/07/2020 CLINICAL DATA:  Code stroke.  Left-sided weakness EXAM: CT HEAD WITHOUT CONTRAST TECHNIQUE: Contiguous axial images were obtained from the base of the skull through the vertex without intravenous contrast. COMPARISON:  02/20/2019 FINDINGS: Brain: There is no acute intracranial hemorrhage, mass effect, or edema. No new loss of gray-white differentiation. Multiple chronic infarcts are again identified within encephalomalacia/gliosis present in the parasagittal right frontal lobe, right parietal lobe, and left occipital lobe. There are also chronic small vessel infarcts of the left basal ganglia and adjacent white matter and left thalamus. Prominence of the ventricles and sulci reflects stable parenchymal volume loss. Additional patchy and confluent areas of hypoattenuation in the supratentorial white matter probably reflect stable chronic microvascular ischemic changes. Vascular: No hyperdense vessel. There is intracranial atherosclerotic calcification at the skull base. Skull: Unremarkable. Sinuses/Orbits: No acute abnormality. Other: Mastoid air cells are clear. IMPRESSION: No acute intracranial hemorrhage or evidence of acute infarction. Multiple chronic infarcts.  Chronic microvascular ischemic changes. These results were  communicated to Dr. Curly Shores at 9:38 am on 04/07/2020 by text page via the [facility] messaging system. Electronically Signed   By: Macy Mis M.D.   On: 04/07/2020 09:41    Cardiac Studies  TTE 04/07/2020 1. Left ventricular ejection fraction, by estimation, is 60 to 65%. The  left ventricle has normal function. The left ventricle has no regional  wall motion abnormalities. There is mild concentric left ventricular  hypertrophy. Left ventricular diastolic  function could not be evaluated.  2. Right ventricular systolic function is normal. The right ventricular  size is normal.  3. The mitral valve is grossly normal. Trivial mitral valve  regurgitation.  4. The aortic valve is tricuspid. Aortic valve regurgitation is not  visualized. No aortic stenosis is present.    Left heart cath 01/28/2020 -25% mid LAD, 25% RCA -Nonobstructive CAD  Patient Profile  Jordan [name] is a 74 y.o. male with diabetes, hypertension, CKD, nonobstructive CAD who was admitted on 04/07/2020 with severe hyperglycemia and altered mentation.  He was found to be in complete heart block.  Assessment & Plan   1.  Complete heart block -Admitted 04/07/2020 with altered mentation.  There was concern for an acute stroke but this was ruled out.  Significantly elevated blood glucose levels concerning for hyper osmolar state.  EEG with diffuse encephalopathy.  Found to be in complete heart block. -Echo shows normal LV function.  Recent heart cath at [facility] in October demonstrates nonobstructive and minimal CAD. -Temporary pacing wire placed on 04/07/2020. -Appropriate capture.  Capture was lost at 0.8 mA.  Underlying rhythm is ventricular standstill.  He has a lack of ventricular escape. -Continue temporary pacing at 5 mA.  He has appropriate capture.  I did discuss this with electrophysiology.  They will see him today.  Timing of pacemaker implantation will be decided by them.  May need to occur sooner than later.  For  questions or updates, please contact [place] Please consult [email or web address] for contact info under   Time Spent with Patient: I have spent a  total of 25 minutes with patient reviewing hospital notes, telemetry, EKGs, labs and examining the patient as well as establishing an assessment and plan that was discussed with the patient.  > 50% of time was spent in direct patient care.    Signed, Addison Naegeli. Audie Box, [place]  04/08/2020 8:34 AM

## 2020-04-09 DIAGNOSIS — E1165 Type 2 diabetes mellitus with hyperglycemia: Secondary | ICD-10-CM

## 2020-04-09 LAB — GLUCOSE, CAPILLARY
Glucose-Capillary: 161 mg/dL — ABNORMAL HIGH (ref 70–99)
Glucose-Capillary: 170 mg/dL — ABNORMAL HIGH (ref 70–99)
Glucose-Capillary: 179 mg/dL — ABNORMAL HIGH (ref 70–99)
Glucose-Capillary: 151 mg/dL — ABNORMAL HIGH (ref 70–99)
Glucose-Capillary: 173 mg/dL — ABNORMAL HIGH (ref 70–99)
Glucose-Capillary: 148 mg/dL — ABNORMAL HIGH (ref 70–99)
Glucose-Capillary: 192 mg/dL — ABNORMAL HIGH (ref 70–99)
Glucose-Capillary: 171 mg/dL — ABNORMAL HIGH (ref 70–99)
Glucose-Capillary: 172 mg/dL — ABNORMAL HIGH (ref 70–99)
Glucose-Capillary: 154 mg/dL — ABNORMAL HIGH (ref 70–99)
Glucose-Capillary: 156 mg/dL — ABNORMAL HIGH (ref 70–99)
Glucose-Capillary: 128 mg/dL — ABNORMAL HIGH (ref 70–99)

## 2020-04-09 LAB — BASIC METABOLIC PANEL
Anion gap: 7 (ref 5–15)
BUN: 25 mg/dL — ABNORMAL HIGH (ref 8–23)
CO2: 25 mmol/L (ref 22–32)
Calcium: 8.5 mg/dL — ABNORMAL LOW (ref 8.9–10.3)
Chloride: 112 mmol/L — ABNORMAL HIGH (ref 98–111)
Creatinine, Ser: 1.62 mg/dL — ABNORMAL HIGH (ref 0.61–1.24)
GFR, Estimated: 44 mL/min — ABNORMAL LOW (ref >60)
Glucose, Bld: 199 mg/dL — ABNORMAL HIGH (ref 70–99)
Potassium: 4.3 mmol/L (ref 3.5–5.1)
Sodium: 144 mmol/L (ref 135–145)

## 2020-04-09 LAB — CBC
HCT: 36.8 % — ABNORMAL LOW (ref 39.0–52.0)
Hemoglobin: 11.4 g/dL — ABNORMAL LOW (ref 13.0–17.0)
MCH: 29.1 pg (ref 26.0–34.0)
MCHC: 31 g/dL (ref 30.0–36.0)
MCV: 93.9 fL (ref 80.0–100.0)
Platelets: 143 10*3/uL — ABNORMAL LOW (ref 150–400)
RBC: 3.92 MIL/uL — ABNORMAL LOW (ref 4.22–5.81)
RDW: 13.7 % (ref 11.5–15.5)
WBC: 12.4 10*3/uL — ABNORMAL HIGH (ref 4.0–10.5)
nRBC: 0 % (ref 0.0–0.2)

## 2020-04-09 MED ORDER — LACTATED RINGERS IV SOLN: INTRAVENOUS | Status: DC

## 2020-04-09 MED ORDER — INSULIN ASPART 100 UNIT/ML ~~LOC~~ SOLN
2.0000 [IU] | SUBCUTANEOUS | Status: DC
  Administered 2020-04-09: 16:00:00 4 [IU] via SUBCUTANEOUS
  Administered 2020-04-09 – 2020-04-10 (×2): 2 [IU] via SUBCUTANEOUS

## 2020-04-09 MED ORDER — POTASSIUM CHLORIDE 20 MEQ PO PACK
40.0000 meq | PACK | Freq: Once | ORAL | Status: AC
  Administered 2020-04-09: 01:00:00 40 meq
  Filled 2020-04-09: qty 2

## 2020-04-09 MED ORDER — INSULIN DETEMIR 100 UNIT/ML ~~LOC~~ SOLN
10.0000 [IU] | Freq: Two times a day (BID) | SUBCUTANEOUS | Status: DC
  Administered 2020-04-09 (×2): 10 [IU] via SUBCUTANEOUS
  Filled 2020-04-09 (×4): qty 0.1

## 2020-04-09 NOTE — Progress Notes (Signed)
eLink Physician-Brief Progress Note Patient Name: Jordan [name] DOB: July 31, 1945 MRN: [id number]   Date of Service  04/09/2020  HPI/Events of Note  Agitation - Nursing request for bilateral soft wrist restraints.   eICU Interventions  Will order bilateral soft wrist restraints x 13 hours.      Intervention Category Major Interventions: Delirium, psychosis, severe agitation - evaluation and management  Felice Deem Eugene 04/09/2020, 8:22 PM

## 2020-04-09 NOTE — Progress Notes (Signed)
NAME:  Jordan [name], MRN:  [id number], DOB:  May 21, 1945, LOS: 2 ADMISSION DATE:  04/07/2020, CONSULTATION DATE:  04/07/20 REFERRING MD:  Particia Nearing, CHIEF COMPLAINT:  stroke   Brief History:  Jordan [name] is a 74 yo M w/ PMH of Dm, HTN, CKD, CAD presenting to [facility] after being found down. Found to be altered. Intubated for altered mental status  History of Present Illness:  Jordan [name] is a 74 yo M w/ PMH listed below presenting to [facility] after being found down at home. On arrival he was noted to be altered, minimally responsive with inability to follow commands. He is unable to provide history.  History obtained via chart review. EMS pushed emergency call from home around midnight. Patient was unable to be accessed by EMS. Police was called and he was found him at home down for unknown duration. He was brought in as code stroke due to left neglect but was found to be bradycardic with HR in 30s. He was given atropine, cardiology was consulted and intubated. He was also found to be in HHS with cbg >500. Insulin gtt started and PCCM consulted for admission.  Past Medical History:   Past Medical History:  Diagnosis Date  . BPH (benign prostatic hyperplasia)   . CVA (cerebral vascular accident) (HCC) 10/23/2017   CT confirms subacute R parietal infarct  . GERD (gastroesophageal reflux disease)   . High cholesterol   . Hypertension    Significant Hospital Events:  04/07/20 Present to [facility]  Consults:  Neurology, Cardiology  Procedures:  EEG 12/17 >>  Temporary pacemaker placement 12/17 >>   Significant Diagnostic Tests:  04/07/20 CT CERVICAL SPINE WO CONTRAST IMPRESSION: Negative for acute fracture or malalignment of the cervical spine. Degenerative changes as above.  04/07/20 CT Head IMPRESSION: No acute intracranial hemorrhage or evidence of acute infarction. Multiple chronic infarcts.  Chronic microvascular ischemic changes.  04/07/20 MR Brain IMPRESSION: No acute infarction. No  proximal intracranial vessel occlusion.  EEG 12/17 Moderate to severe diffuse encephalopathy without any epileptiform discharges or seizure activity (on propofol)  Echocardiogram 12/17 LVEF 60-65%, normal function.  No regional wall motion abnormality.  Mild concentric LVH.  RV size and function normal   Micro Data:  12/17 COVID, FLU neg  Antimicrobials:    Interim History / Subjective:   Potassium replaced a.m. 12/19 Propofol 38 PRBC 500x12, 0.40+5 I/O+ for 5 L total  Objective   Blood pressure (!) 109/34, pulse (!) 58, temperature 98.1 F (36.7 C), temperature source Axillary, resp. rate (!) 36, height 5\' 11"  (1.803 m), weight 74.8 kg, SpO2 100 %.    Vent Mode: PRVC FiO2 (%):  [40 %] 40 % Set Rate:  [12 bmp] 12 bmp Vt Set:  [500 mL] 500 mL PEEP:  [5 cmH20] 5 cmH20 Pressure Support:  [12 cmH20] 12 cmH20 Plateau Pressure:  [13 cmH20-20 cmH20] 15 cmH20   Intake/Output Summary (Last 24 hours) at 04/09/2020 0731 Last data filed at 04/09/2020 0700 Gross per 24 hour  Intake 3330.38 ml  Output 1087 ml  Net 2243.38 ml   Filed Weights   04/07/20 0900 04/08/20 0300 04/09/20 0635  Weight: 75.8 kg 74.8 kg 74.8 kg    Examination:  Gen: Ill-appearing man, intubated and sedated HEENT: ET tube in place, poor dentition, pupils equal Neck: No JVD CV: 100% paced rhythm 60, distant, no murmur Pulm: Decreased at both bases, no crackles or wheezes Abd: Nondistended, positive bowel sounds Extm: No edema Skin: No rash Neuro: Groggy, open eyes to  voice, nodded to questions, moves extremities  Resolved Hospital Problem list     Assessment & Plan:   #HHS Last known hgb a1c in pre-diabetes range at 6.4. Not on diabetic meds at home. Admit cbg 540. Gap of 20. Elevated serum osm at 343 with no acidosis (pH 7.37) Consistent with HHS. Started on insulin gtt in ED. -Acidosis improved, CBGs improved.  Plan to transition to sliding scale insulin off insulin infusion on 12/19 -KVO IV  fluids -Consider tube feeding afternoon 12/19 once transition to subcutaneous insulin completed -Follow BMP, replace electrolytes if indicated  #Complete Heart Block 3rd degree heart block on EKG. Cardiology on board. Pacers placed. Given atropine in ED.  -Appreciate cardiology assistance and management.  He does not have an underlying rhythm when his temporary pacemaker wire does not capture.  Planning for pacemaker placement on 12/20 -Continue to follow telemetry -Continue to hold all rate controlling medications  #Acute encephalopathy due to above Likely multi-factorial due to HHS + symptomatic bradycardia. Intubated and sedated with fentanyl gtt. ABG showing no evidence of respiratory distress. Mild resp alkalosis due to tachypnea -Stabilized metabolic disarray, improved -Minimize sedating medications  #Acute respiratory failure, principally due to encephalopathy, altered mental status and acidosis -PRVC 7 cc/kg -Plan to leave him intubated, ventilated until he gets his pacemaker placed on 12/20. Then we can start to perform spontaneous breathing trials.  His acidosis and encephalopathy are both improved so hopefully he will extubate quickly  #Acute Kidney Injury on Chronic Kidney Disease, improving Due to dehydration. Admit creatinine 3.06. Baseline 1.3-1.4.  Improving -Better with IV fluid resuscitation -Follow urine output, BMP -Avoid nephrotoxins -Renal dose medications  #CAD Cath in 01/2020 with non-obstructive cad -Continue rosuvastatin, aspirin  Best practice (evaluated daily)  Diet: NPO Pain/Anxiety/Delirium protocol (if indicated): fentanyl, precedex VAP protocol (if indicated): Y DVT prophylaxis: subqhep GI prophylaxis: PPI Glucose control: SSI Mobility: BR Disposition:ICU  Goals of Care:  Last date of multidisciplinary goals of care discussion: N/A Family and staff present: N/A Summary of discussion: N/A Follow up goals of care discussion due: 04/10/20 Code  Status: FUll  Labs   CBC: Recent Labs  Lab 04/07/20 0915 04/07/20 0922 04/07/20 1116 04/07/20 1907 04/07/20 2336 04/08/20 0259 04/09/20 0250  WBC 17.7*  --   --  16.1*  --   --  12.4*  NEUTROABS 14.3*  --   --   --   --   --   --   HGB 14.7   < > 13.6 13.2 12.2* 12.2* 11.4*  HCT 47.3   < > 40.0 41.1 36.0* 36.0* 36.8*  MCV 92.9  --   --  89.7  --   --  93.9  PLT 263  --   --  235  --   --  143*   < > = values in this interval not displayed.    Basic Metabolic Panel: Recent Labs  Lab 04/08/20 0439 04/08/20 1113 04/08/20 1510 04/08/20 2102 04/09/20 0250  NA 145 143 144 145 144  K 3.7 3.6 3.6 3.7 4.3  CL 109 108 112* 109 112*  CO2 27 26 24 27 25   GLUCOSE 232* 204* 200* 267* 199*  BUN 36* 32* 29* 27* 25*  CREATININE 2.03* 1.95* 1.63* 1.70* 1.62*  CALCIUM 9.0 8.8* 8.1* 8.7* 8.5*  MG 2.6*  --   --   --   --   PHOS 1.9*  --   --   --   --    GFR:  Estimated Creatinine Clearance: 42.3 mL/min (A) (by C-G formula based on SCr of 1.62 mg/dL (H)). Recent Labs  Lab 04/07/20 0915 04/07/20 1907 04/09/20 0250  WBC 17.7* 16.1* 12.4*    Liver Function Tests: Recent Labs  Lab 04/07/20 0915  AST 39  ALT 34  ALKPHOS 59  BILITOT 1.2  PROT 7.3  ALBUMIN 3.3*   No results for input(s): LIPASE, AMYLASE in the last 168 hours. Recent Labs  Lab 04/07/20 1907  AMMONIA 28    ABG    Component Value Date/Time   PHART 7.429 04/08/2020 0259   PCO2ART 40.9 04/08/2020 0259   PO2ART 179 (H) 04/08/2020 0259   HCO3 26.8 04/08/2020 0259   TCO2 28 04/08/2020 0259   ACIDBASEDEF 2.0 04/07/2020 1116   O2SAT 100.0 04/08/2020 0259     Coagulation Profile: Recent Labs  Lab 04/07/20 0947  INR 1.2    Cardiac Enzymes: Recent Labs  Lab 04/07/20 0915  CKTOTAL 3,471*    HbA1C: Hgb A1c MFr Bld  Date/Time Value Ref Range Status  04/07/2020 09:15 AM 11.9 (H) 4.8 - 5.6 % Final    Comment:    (NOTE) Pre diabetes:          5.7%-6.4%  Diabetes:              >6.4%  Glycemic  control for   <7.0% adults with diabetes   10/24/2017 03:57 AM 6.4 (H) 4.8 - 5.6 % Final    Comment:    (NOTE) Pre diabetes:          5.7%-6.4% Diabetes:              >6.4% Glycemic control for   <7.0% adults with diabetes     CBG: Recent Labs  Lab 04/08/20 2340 04/09/20 0044 04/09/20 0151 04/09/20 0300 04/09/20 0356  GLUCAP 189* 161* 170* 179* 151*    Critical care time: 33 min      Levy Pupa, MD, PhD 04/09/2020, 7:31 AM [facility] [phone] or if no answer [phone]

## 2020-04-09 NOTE — Progress Notes (Signed)
eLink Physician-Brief Progress Note Patient Name: Jordan [name] DOB: 1946/03/22 MRN: [id number]   Date of Service  04/09/2020  HPI/Events of Note  Hypokalemia - K+ = 3.7 and Creatinine = 1.70. Ca++ = 8.7 which corrects to 9.26 (Normal) given albumin = 3.3.   eICU Interventions  Plan: 1. Will replace K+. 2. No replacement indicated for Ca++ which corrects to normal.      Intervention Category Major Interventions: Electrolyte abnormality - evaluation and management  Bryana Froemming Eugene 04/09/2020, 12:28 AM

## 2020-04-09 NOTE — Progress Notes (Addendum)
Progress Note  Patient Name: Jordan [name] Date of Encounter: 04/09/2020  Primary Cardiologist: No primary care provider on file.   Subjective   No acute events overnight.  On minimal vent support.  Follows commands per nursing. There is an underlying escape around 20 bpm.  Capture threshold on the temporary pacemaker is 0.8.  Inpatient Medications    Scheduled Meds: . aspirin  81 mg Per Tube Daily  . chlorhexidine gluconate (MEDLINE KIT)  15 mL Mouth Rinse BID  . Chlorhexidine Gluconate Cloth  6 each Topical Daily  . docusate  100 mg Per Tube BID  . heparin  5,000 Units Subcutaneous Q8H  . mouth rinse  15 mL Mouth Rinse 10 times per day  . mupirocin ointment  1 application Nasal BID  . polyethylene glycol  17 g Per Tube Daily  . rosuvastatin  20 mg Per Tube Daily  . sodium chloride flush  10-40 mL Intracatheter Q12H   Continuous Infusions: . dextrose 5% lactated ringers 125 mL/hr at 04/09/20 1005  . insulin 1.6 mL/hr at 04/09/20 1005  . lactated ringers 125 mL/hr at 04/07/20 1552  . propofol (DIPRIVAN) infusion 30 mcg/kg/min (04/09/20 1007)   PRN Meds: dextrose, fentaNYL (SUBLIMAZE) injection, fentaNYL (SUBLIMAZE) injection, hydrALAZINE, sodium chloride flush   Vital Signs    Vitals:   04/09/20 0738 04/09/20 0833 04/09/20 1035 04/09/20 1100  BP:    (!) 94/52  Pulse:    (!) 59  Resp: 12  15 (!) 21  Temp:  100.1 F (37.8 C)    TempSrc:  Axillary    SpO2: 100%  100% 100%  Weight:      Height:        Intake/Output Summary (Last 24 hours) at 04/09/2020 1133 Last data filed at 04/09/2020 1005 Gross per 24 hour  Intake 3773.15 ml  Output 1045 ml  Net 2728.15 ml   Filed Weights   04/07/20 0900 04/08/20 0300 04/09/20 0635  Weight: 75.8 kg 74.8 kg 74.8 kg    Telemetry    100% paced at 60 bpm- Personally Reviewed  ECG    No new- Personally Reviewed  Physical Exam   GEN:  Ventilated and sedated Neck: No JVD Cardiac:  Regular rhythm, paced.     Respiratory: Clear to auscultation bilaterally.  Ventilated GI: Soft, nontender, non-distended  MS: No edema; No deformity. Neuro:  Sedated Psych: Sedated  Labs    Chemistry Recent Labs  Lab 04/07/20 0915 04/07/20 0922 04/08/20 1510 04/08/20 2102 04/09/20 0250  NA 138   < > 144 145 144  K 5.2*   < > 3.6 3.7 4.3  CL 100   < > 112* 109 112*  CO2 21*   < > '24 27 25  ' GLUCOSE 542*   < > 200* 267* 199*  BUN 44*   < > 29* 27* 25*  CREATININE 3.06*   < > 1.63* 1.70* 1.62*  CALCIUM 9.9   < > 8.1* 8.7* 8.5*  PROT 7.3  --   --   --   --   ALBUMIN 3.3*  --   --   --   --   AST 39  --   --   --   --   ALT 34  --   --   --   --   ALKPHOS 59  --   --   --   --   BILITOT 1.2  --   --   --   --  GFRNONAA 21*   < > 44* 42* 44*  ANIONGAP 17*   < > '8 9 7   ' < > = values in this interval not displayed.     Hematology Recent Labs  Lab 04/07/20 0915 04/07/20 0922 04/07/20 1907 04/07/20 2336 04/08/20 0259 04/09/20 0250  WBC 17.7*  --  16.1*  --   --  12.4*  RBC 5.09  --  4.58  --   --  3.92*  HGB 14.7   < > 13.2 12.2* 12.2* 11.4*  HCT 47.3   < > 41.1 36.0* 36.0* 36.8*  MCV 92.9  --  89.7  --   --  93.9  MCH 28.9  --  28.8  --   --  29.1  MCHC 31.1  --  32.1  --   --  31.0  RDW 13.2  --  13.3  --   --  13.7  PLT 263  --  235  --   --  143*   < > = values in this interval not displayed.    Cardiac EnzymesNo results for input(s): TROPONINI in the last 168 hours. No results for input(s): TROPIPOC in the last 168 hours.   BNPNo results for input(s): BNP, PROBNP in the last 168 hours.   DDimer No results for input(s): DDIMER in the last 168 hours.   Radiology    EEG  Result Date: 04/07/2020 Lora Havens, MD     04/07/2020  5:22 PM Patient Name: ALECK LOCKLIN MRN: [id number] Epilepsy Attending: Lora Havens Referring Physician/Provider: Dr Lesleigh Noe Date: 04/07/2020 Duration: 23.43 mins Patient history: 74yo M with ams. EEG to evaluate for seizure. Level of  alertness:  comatose AEDs during EEG study: Propofol Technical aspects: This EEG study was done with scalp electrodes positioned according to the 10-20 International system of electrode placement. Electrical activity was acquired at a sampling rate of '500Hz'  and reviewed with a high frequency filter of '70Hz'  and a low frequency filter of '1Hz' . EEG data were recorded continuously and digitally stored. Description: EEG showed continuous generalized 3 to 6 Hz theta-delta slowing. Hyperventilation and photic stimulation were not performed.   ABNORMALITY - Continuous slow, generalized IMPRESSION: This study is suggestive of moderate to severe diffuse encephalopathy. No seizures or epileptiform discharges were seen throughout the recording. Lora Havens   CARDIAC CATHETERIZATION  Result Date: 04/07/2020 Successful placement of temporary transvenous pacemaker via right jugular access. Plan: check portable CXR for line placement. Will leave at rate 60 bpm, 5 mA output. Capture threshold < 0.1 mA  DG Chest Portable 1 View  Result Date: 04/07/2020 CLINICAL DATA:  Unresponsive.  Intubation. EXAM: PORTABLE CHEST 1 VIEW COMPARISON:  Chest x-ray dated December 30, 2019. FINDINGS: The patient is rotated to the right. Endotracheal tube tip approximately 1.9 cm above the carina. Enteric tube entering the stomach with the tip below the field of view. The heart size and mediastinal contours are within normal limits. Loop recorder again noted. No focal consolidation, pleural effusion, or pneumothorax. No acute osseous abnormality. IMPRESSION: 1. Appropriately positioned endotracheal tube.  No active disease. Electronically Signed   By: Titus Dubin M.D.   On: 04/07/2020 12:17   DG Chest Port 1V same Day  Result Date: 04/07/2020 CLINICAL DATA:  Confirm pacer placement. EXAM: PORTABLE CHEST 1 VIEW COMPARISON:  April 07, 2020 FINDINGS: There is stable endotracheal tube and nasogastric tube positioning. Interval right  internal jugular cardiac pacer lead placement is seen with appropriate  lead tip positioning seen overlying the left lung base. The heart size and mediastinal contours are within normal limits. Both lungs are clear. The visualized skeletal structures are unremarkable. IMPRESSION: Interval right internal jugular cardiac pacer placement positioning, as described above, without evidence of acute or active cardiopulmonary disease. Electronically Signed   By: Virgina Norfolk M.D.   On: 04/07/2020 19:20   DG Abd Portable 1 View  Result Date: 04/07/2020 CLINICAL DATA:  Unresponsive.  Enteric tube placement. EXAM: PORTABLE ABDOMEN - 1 VIEW COMPARISON:  None. FINDINGS: Enteric tube in the stomach. The bowel gas pattern is normal. No radio-opaque calculi or other significant radiographic abnormality are seen. No acute osseous abnormality. Bilateral hip osteoarthritis. IMPRESSION: 1. Enteric tube in the stomach. Electronically Signed   By: Titus Dubin M.D.   On: 04/07/2020 12:18   ECHOCARDIOGRAM COMPLETE  Result Date: 04/07/2020    ECHOCARDIOGRAM REPORT   Patient Name:   JAHVON GOSLINE Date of Exam: 04/07/2020 Medical Rec #:  [id number]    Height:       71.0 in Accession #:    [id number]   Weight:       167.1 lb Date of Birth:  May 19, 1945   BSA:          1.954 m Patient Age:    84 years     BP:           131/40 mmHg Patient Gender: M            HR:           36 bpm. Exam Location:  Inpatient Procedure: 2D Echo, Color Doppler, Cardiac Doppler and Intracardiac            Opacification Agent Indications:    Heart block  History:        Patient has prior history of Echocardiogram examinations, most                 recent 10/27/2017. Risk Factors:Sleep Apnea, Former Smoker,                 Hypertension and Dyslipidemia. GERD.  Sonographer:    Clayton Lefort RDCS (AE) Referring Phys: [id number] Marydel  Sonographer Comments: Echo performed with patient supine and on artificial respirator. Limited patient mobility.  IMPRESSIONS  1. Left ventricular ejection fraction, by estimation, is 60 to 65%. The left ventricle has normal function. The left ventricle has no regional wall motion abnormalities. There is mild concentric left ventricular hypertrophy. Left ventricular diastolic function could not be evaluated.  2. Right ventricular systolic function is normal. The right ventricular size is normal.  3. The mitral valve is grossly normal. Trivial mitral valve regurgitation.  4. The aortic valve is tricuspid. Aortic valve regurgitation is not visualized. No aortic stenosis is present. Conclusion(s)/Recommendation(s): In heart block throughout study, with ventricular rate in the 30s. Per EMR, teams are aware and managing heart block. FINDINGS  Left Ventricle: Left ventricular ejection fraction, by estimation, is 60 to 65%. The left ventricle has normal function. The left ventricle has no regional wall motion abnormalities. Definity contrast agent was given IV to delineate the left ventricular  endocardial borders. The left ventricular internal cavity size was normal in size. There is mild concentric left ventricular hypertrophy. Left ventricular diastolic function could not be evaluated. Right Ventricle: The right ventricular size is normal. Right vetricular wall thickness was not well visualized. Right ventricular systolic function is normal. Left Atrium: Left atrial size was normal in  size. Right Atrium: Right atrial size was normal in size. Pericardium: There is no evidence of pericardial effusion. Mitral Valve: The mitral valve is grossly normal. There is mild thickening of the mitral valve leaflet(s). There is mild calcification of the mitral valve leaflet(s). Trivial mitral valve regurgitation. Tricuspid Valve: The tricuspid valve is normal in structure. Tricuspid valve regurgitation is trivial. No evidence of tricuspid stenosis. Aortic Valve: The aortic valve is tricuspid. Aortic valve regurgitation is not visualized. No  aortic stenosis is present. Pulmonic Valve: The pulmonic valve was grossly normal. Pulmonic valve regurgitation is trivial. No evidence of pulmonic stenosis. Aorta: The aortic root and ascending aorta are structurally normal, with no evidence of dilitation. Venous: IVC assessment for right atrial pressure unable to be performed due to mechanical ventilation. IAS/Shunts: The atrial septum is grossly normal.  LEFT VENTRICLE PLAX 2D LVIDd:         3.25 cm LVIDs:         2.25 cm LV PW:         1.45 cm LV IVS:        1.25 cm LVOT diam:     1.60 cm LVOT Area:     2.01 cm  RIGHT VENTRICLE RV Basal diam:  3.20 cm RV S prime:     12.60 cm/s TAPSE (M-mode): 2.1 cm LEFT ATRIUM             Index       RIGHT ATRIUM           Index LA diam:        2.80 cm 1.43 cm/m  RA Area:     11.50 cm LA Vol (A2C):   47.5 ml 24.31 ml/m RA Volume:   22.20 ml  11.36 ml/m LA Vol (A4C):   27.5 ml 14.08 ml/m LA Biplane Vol: 36.5 ml 18.68 ml/m   AORTA Ao Root diam: 3.10 cm Ao Asc diam:  2.50 cm TRICUSPID VALVE TR Peak grad:   15.4 mmHg TR Vmax:        196.00 cm/s  SHUNTS Systemic Diam: 1.60 cm Buford Dresser MD Electronically signed by Buford Dresser MD Signature Date/Time: 04/07/2020/4:27:00 PM    Final     Cardiac Studies   No new   Assessment & Plan    Mr. Grove is a 74 year old man admitted with HHS and complete heart block requiring temporary pacemaker implant.  No clear reversible cause of his complete heart block.  I discussed the indication for permanent pacing with his next of kin, [name] (sister), who is in agreement with the plan.  His ejection fraction is 60% so plan will be to implant a dual-chamber permanent pacemaker.  1.  Complete heart block Plan for permanent pacemaker Monday.  Keep n.p.o. after midnight tonight.  2.  HHS Resolved.  Treatment per primary team.  3.  AKI Creatinine has improved to 1.62 after it peaked at 3.06.   For questions or updates, please contact [facility] Please consult [email or web address] for contact info under Cardiology/STEMI.      Signed, Lars Mage, MD  04/09/2020, 11:33 AM

## 2020-04-09 NOTE — Plan of Care (Signed)

## 2020-04-10 ENCOUNTER — Encounter (HOSPITAL_COMMUNITY): Payer: Self-pay | Admitting: Cardiology

## 2020-04-10 ENCOUNTER — Inpatient Hospital Stay (HOSPITAL_COMMUNITY)
Admission: EM | Disposition: A | Discharge status: Skilled Nursing Facility | Payer: Self-pay | Source: Home / Self Care | Attending: Internal Medicine

## 2020-04-10 ENCOUNTER — Inpatient Hospital Stay (HOSPITAL_COMMUNITY): Payer: Medicare HMO

## 2020-04-10 DIAGNOSIS — J9621 Acute and chronic respiratory failure with hypoxia: Secondary | ICD-10-CM

## 2020-04-10 DIAGNOSIS — L8990 Pressure ulcer of unspecified site, unspecified stage: Secondary | ICD-10-CM | POA: Insufficient documentation

## 2020-04-10 HISTORY — PX: PACEMAKER IMPLANT: EP1218

## 2020-04-10 LAB — GLUCOSE, CAPILLARY
Glucose-Capillary: 113 mg/dL — ABNORMAL HIGH (ref 70–99)
Glucose-Capillary: 117 mg/dL — ABNORMAL HIGH (ref 70–99)
Glucose-Capillary: 122 mg/dL — ABNORMAL HIGH (ref 70–99)
Glucose-Capillary: 123 mg/dL — ABNORMAL HIGH (ref 70–99)
Glucose-Capillary: 145 mg/dL — ABNORMAL HIGH (ref 70–99)
Glucose-Capillary: 88 mg/dL (ref 70–99)
Glucose-Capillary: 95 mg/dL (ref 70–99)

## 2020-04-10 LAB — CBC
HCT: 34.3 % — ABNORMAL LOW (ref 39.0–52.0)
Hemoglobin: 10.7 g/dL — ABNORMAL LOW (ref 13.0–17.0)
MCH: 29.2 pg (ref 26.0–34.0)
MCHC: 31.2 g/dL (ref 30.0–36.0)
MCV: 93.7 fL (ref 80.0–100.0)
Platelets: 141 10*3/uL — ABNORMAL LOW (ref 150–400)
RBC: 3.66 MIL/uL — ABNORMAL LOW (ref 4.22–5.81)
RDW: 13.7 % (ref 11.5–15.5)
WBC: 13.8 10*3/uL — ABNORMAL HIGH (ref 4.0–10.5)
nRBC: 0 % (ref 0.0–0.2)

## 2020-04-10 LAB — BASIC METABOLIC PANEL
Anion gap: 9 (ref 5–15)
BUN: 23 mg/dL (ref 8–23)
CO2: 27 mmol/L (ref 22–32)
Calcium: 8.4 mg/dL — ABNORMAL LOW (ref 8.9–10.3)
Chloride: 110 mmol/L (ref 98–111)
Creatinine, Ser: 1.84 mg/dL — ABNORMAL HIGH (ref 0.61–1.24)
GFR, Estimated: 38 mL/min — ABNORMAL LOW (ref >60)
Glucose, Bld: 111 mg/dL — ABNORMAL HIGH (ref 70–99)
Potassium: 3.5 mmol/L (ref 3.5–5.1)
Sodium: 146 mmol/L — ABNORMAL HIGH (ref 135–145)

## 2020-04-10 LAB — CK TOTAL AND CKMB (NOT AT ARMC)
CK, MB: 3.2 ng/mL (ref 0.5–5.0)
Relative Index: 0.3 (ref 0.0–2.5)
Total CK: 970 U/L — ABNORMAL HIGH (ref 49–397)

## 2020-04-10 LAB — PROTIME-INR
INR: 1.3 — ABNORMAL HIGH (ref 0.8–1.2)
Prothrombin Time: 15.3 seconds — ABNORMAL HIGH (ref 11.4–15.2)

## 2020-04-10 LAB — MAGNESIUM: Magnesium: 2.5 mg/dL — ABNORMAL HIGH (ref 1.7–2.4)

## 2020-04-10 SURGERY — PACEMAKER IMPLANT
Anesthesia: LOCAL

## 2020-04-10 MED ORDER — SODIUM CHLORIDE 0.9 % IV SOLN: INTRAVENOUS | Status: DC | PRN

## 2020-04-10 MED ORDER — HEPARIN (PORCINE) IN NACL 1000-0.9 UT/500ML-% IV SOLN
INTRAVENOUS | Status: AC
  Filled 2020-04-10: qty 500

## 2020-04-10 MED ORDER — SODIUM CHLORIDE 0.9 % IV SOLN: INTRAVENOUS | Status: DC

## 2020-04-10 MED ORDER — CEFAZOLIN SODIUM-DEXTROSE 2-3 GM-%(50ML) IV SOLR
INTRAVENOUS | Status: AC | PRN
  Administered 2020-04-10: 08:00:00 2 g via INTRAVENOUS

## 2020-04-10 MED ORDER — SODIUM CHLORIDE 0.9 % IV SOLN
80.0000 mg | INTRAVENOUS | Status: DC
  Filled 2020-04-10: qty 2

## 2020-04-10 MED ORDER — FENTANYL CITRATE (PF) 100 MCG/2ML IJ SOLN: 25.0000 ug | INTRAMUSCULAR | Status: DC | PRN

## 2020-04-10 MED ORDER — CHLORHEXIDINE GLUCONATE 4 % EX LIQD: 60.0000 mL | Freq: Once | CUTANEOUS | Status: DC

## 2020-04-10 MED ORDER — CEFAZOLIN SODIUM-DEXTROSE 2-4 GM/100ML-% IV SOLN
INTRAVENOUS | Status: AC
  Filled 2020-04-10: qty 100

## 2020-04-10 MED ORDER — CEFAZOLIN SODIUM-DEXTROSE 2-4 GM/100ML-% IV SOLN
2.0000 g | INTRAVENOUS | Status: DC
  Filled 2020-04-10: qty 100

## 2020-04-10 MED ORDER — DEXMEDETOMIDINE HCL IN NACL 400 MCG/100ML IV SOLN
0.2000 ug/kg/h | INTRAVENOUS | Status: DC
  Administered 2020-04-10: 15:00:00 0.4 ug/kg/h via INTRAVENOUS
  Administered 2020-04-11: 04:00:00 0.2 ug/kg/h via INTRAVENOUS
  Filled 2020-04-10 (×2): qty 100

## 2020-04-10 MED ORDER — ONDANSETRON HCL 4 MG/2ML IJ SOLN: 4.0000 mg | Freq: Four times a day (QID) | INTRAMUSCULAR | Status: DC | PRN

## 2020-04-10 MED ORDER — HEPARIN (PORCINE) IN NACL 2-0.9 UNITS/ML
INTRAMUSCULAR | Status: AC | PRN
  Administered 2020-04-10: 500 mL

## 2020-04-10 MED ORDER — FENTANYL CITRATE (PF) 100 MCG/2ML IJ SOLN
25.0000 ug | INTRAMUSCULAR | Status: DC | PRN
  Filled 2020-04-10: qty 2

## 2020-04-10 MED ORDER — SODIUM CHLORIDE 0.9 % IV SOLN
INTRAVENOUS | Status: AC
  Filled 2020-04-10: qty 2

## 2020-04-10 MED ORDER — CEFAZOLIN SODIUM-DEXTROSE 1-4 GM/50ML-% IV SOLN
1.0000 g | Freq: Four times a day (QID) | INTRAVENOUS | Status: AC
Start: 2020-04-10 — End: 2020-04-11
  Administered 2020-04-10 – 2020-04-11 (×3): 1 g via INTRAVENOUS
  Filled 2020-04-10 (×3): qty 50

## 2020-04-10 MED ORDER — POTASSIUM CHLORIDE 10 MEQ/50ML IV SOLN
10.0000 meq | INTRAVENOUS | Status: AC
  Administered 2020-04-10 (×4): 10 meq via INTRAVENOUS
  Filled 2020-04-10 (×3): qty 50

## 2020-04-10 MED ORDER — DEXTROSE-NACL 5-0.9 % IV SOLN: INTRAVENOUS | Status: DC

## 2020-04-10 MED ORDER — LIDOCAINE HCL (PF) 1 % IJ SOLN
INTRAMUSCULAR | Status: AC
  Filled 2020-04-10: qty 60

## 2020-04-10 MED ORDER — LIDOCAINE HCL (PF) 1 % IJ SOLN
INTRAMUSCULAR | Status: DC | PRN
  Administered 2020-04-10: 60 mL

## 2020-04-10 MED ORDER — PANTOPRAZOLE SODIUM 40 MG PO PACK
40.0000 mg | PACK | Freq: Every day | ORAL | Status: DC
  Administered 2020-04-10 – 2020-04-12 (×3): 40 mg
  Filled 2020-04-10 (×2): qty 20

## 2020-04-10 MED ORDER — ACETAMINOPHEN 325 MG PO TABS: 325.0000 mg | ORAL_TABLET | ORAL | Status: DC | PRN

## 2020-04-10 MED ORDER — PANTOPRAZOLE SODIUM 40 MG PO PACK: 40.0000 mg | PACK | Freq: Every day | ORAL | Status: DC

## 2020-04-10 MED ORDER — DEXMEDETOMIDINE BOLUS VIA INFUSION
1.0000 ug/kg | Freq: Once | INTRAVENOUS | Status: DC
  Filled 2020-04-10: qty 80

## 2020-04-10 SURGICAL SUPPLY — 11 items
CABLE SURGICAL S-101-97-12 (CABLE) ×2 IMPLANT
LEAD TENDRIL MRI 52CM LPA1200M (Lead) ×1 IMPLANT
LEAD TENDRIL MRI 58CM LPA1200M (Lead) ×1 IMPLANT
MAT PREVALON FULL STRYKER (MISCELLANEOUS) ×1 IMPLANT
PACEMAKER ASSURITY DR-RF (Pacemaker) ×1 IMPLANT
PAD PRO RADIOLUCENT 2001M-C (PAD) ×2 IMPLANT
POUCH AIGIS-R ANTIBACT PPM (Mesh General) ×2 IMPLANT
POUCH AIGIS-R ANTIBACT PPM MED (Mesh General) IMPLANT
SHEATH 8FR PRELUDE SNAP 13 (SHEATH) ×2 IMPLANT
SHEATH PROBE COVER 6X72 (BAG) ×1 IMPLANT
TRAY PACEMAKER INSERTION (PACKS) ×2 IMPLANT

## 2020-04-10 NOTE — Plan of Care (Signed)

## 2020-04-10 NOTE — Progress Notes (Signed)
Assisted with transport to cath lab and back to pt room. No noted respiratory issues at this time.

## 2020-04-10 NOTE — Progress Notes (Signed)
K 3.5 Electrolytes replaced per protocol 

## 2020-04-10 NOTE — Discharge Instructions (Signed)
    Supplemental Discharge Instructions for  Pacemaker/Defibrillator Patients    Activity No heavy lifting or vigorous activity with your left/right arm for 6 to 8 weeks.  Do not raise your left/right arm above your head for one week.  Gradually raise your affected arm as drawn below.             04/14/2020              04/15/2020               04/16/2020            04/17/2020   NO DRIVING for 1 week, or until cleared by your other physicians .  WOUND CARE - Keep the wound area clean and dry.  Do not get this area wet , no showers for one week; you may shower on  04/17/2020  - The tape/steri-strips on your wound will fall off; do not pull them off.  No bandage is needed on the site.  DO  NOT apply any creams, oils, or ointments to the wound area. - If you notice any drainage or discharge from the wound, any swelling or bruising at the site, or you develop a fever > 101? F after you are discharged home, call the office at once.  Special Instructions - You are still able to use cellular telephones; use the ear opposite the side where you have your pacemaker/defibrillator.  Avoid carrying your cellular phone near your device. - When traveling through airports, show security personnel your identification card to avoid being screened in the metal detectors.  Ask the security personnel to use the hand wand. - Avoid arc welding equipment, MRI testing (magnetic resonance imaging), TENS units (transcutaneous nerve stimulators).  Call the office for questions about other devices. - Avoid electrical appliances that are in poor condition or are not properly grounded. - Microwave ovens are safe to be near or to operate.

## 2020-04-10 NOTE — Progress Notes (Signed)
Progress Note  Patient Name: Jordan [name] Date of Encounter: 04/10/2020  Primary Cardiologist: No primary care provider on file.   Subjective   No acute events overnight. Dr. Quentin Ore at bedside, no underlying today  Inpatient Medications    Scheduled Meds: . aspirin  81 mg Per Tube Daily  . chlorhexidine gluconate (MEDLINE KIT)  15 mL Mouth Rinse BID  . Chlorhexidine Gluconate Cloth  6 each Topical Daily  . docusate  100 mg Per Tube BID  . heparin  5,000 Units Subcutaneous Q8H  . insulin aspart  2-6 Units Subcutaneous Q4H  . insulin detemir  10 Units Subcutaneous Q12H  . mouth rinse  15 mL Mouth Rinse 10 times per day  . mupirocin ointment  1 application Nasal BID  . polyethylene glycol  17 g Per Tube Daily  . rosuvastatin  20 mg Per Tube Daily  . sodium chloride flush  10-40 mL Intracatheter Q12H   Continuous Infusions: . insulin Stopped (04/09/20 1544)  . lactated ringers Stopped (04/10/20 2440)  . potassium chloride 50 mL/hr at 04/10/20 0700  . propofol (DIPRIVAN) infusion 30 mcg/kg/min (04/10/20 0700)   PRN Meds: dextrose, fentaNYL (SUBLIMAZE) injection, hydrALAZINE, sodium chloride flush   Vital Signs    Vitals:   04/10/20 0500 04/10/20 0510 04/10/20 0600 04/10/20 0700  BP: (!) 123/56  107/66 (!) 123/43  Pulse: (!) 58  60 (!) 58  Resp: '13  12 14  ' Temp:      TempSrc:      SpO2: 100%  100% 100%  Weight:  79.8 kg    Height:        Intake/Output Summary (Last 24 hours) at 04/10/2020 0721 Last data filed at 04/10/2020 0700 Gross per 24 hour  Intake 1411.77 ml  Output 1495 ml  Net -83.23 ml   Filed Weights   04/08/20 0300 04/09/20 0635 04/10/20 0510  Weight: 74.8 kg 74.8 kg 79.8 kg    Telemetry    100% paced at 60 bpm- Personally Reviewed  ECG    No new- Personally Reviewed  Physical Exam   GEN:  Ventilated and sedated Neck: No JVD Cardiac:  Regular rhythm, paced.    Respiratory: CTA remains on vent GI: Soft, nontender, non-distended   MS: No edema; No deformity. Neuro:  Sedated Psych: Sedated  Labs    Chemistry Recent Labs  Lab 04/07/20 0915 04/07/20 0922 04/08/20 2102 04/09/20 0250 04/10/20 0331  NA 138   < > 145 144 146*  K 5.2*   < > 3.7 4.3 3.5  CL 100   < > 109 112* 110  CO2 21*   < > '27 25 27  ' GLUCOSE 542*   < > 267* 199* 111*  BUN 44*   < > 27* 25* 23  CREATININE 3.06*   < > 1.70* 1.62* 1.84*  CALCIUM 9.9   < > 8.7* 8.5* 8.4*  PROT 7.3  --   --   --   --   ALBUMIN 3.3*  --   --   --   --   AST 39  --   --   --   --   ALT 34  --   --   --   --   ALKPHOS 59  --   --   --   --   BILITOT 1.2  --   --   --   --   GFRNONAA 21*   < > 42* 44* 38*  ANIONGAP 17*   < >  '9 7 9   ' < > = values in this interval not displayed.     Hematology Recent Labs  Lab 04/07/20 1907 04/07/20 2336 04/08/20 0259 04/09/20 0250 04/10/20 0331  WBC 16.1*  --   --  12.4* 13.8*  RBC 4.58  --   --  3.92* 3.66*  HGB 13.2   < > 12.2* 11.4* 10.7*  HCT 41.1   < > 36.0* 36.8* 34.3*  MCV 89.7  --   --  93.9 93.7  MCH 28.8  --   --  29.1 29.2  MCHC 32.1  --   --  31.0 31.2  RDW 13.3  --   --  13.7 13.7  PLT 235  --   --  143* 141*   < > = values in this interval not displayed.    Cardiac EnzymesNo results for input(s): TROPONINI in the last 168 hours. No results for input(s): TROPIPOC in the last 168 hours.   BNPNo results for input(s): BNP, PROBNP in the last 168 hours.   DDimer No results for input(s): DDIMER in the last 168 hours.   Radiology    DG Chest Port 1 View Result Date: 04/10/2020 CLINICAL DATA:  Intubation. EXAM: PORTABLE CHEST 1 VIEW COMPARISON:  04/07/2020. FINDINGS: Endotracheal tube, NG tube, right IJ pacer lead in stable position. Cardiac monitor device in stable position. Heart size stable. Low lung volumes with mild bibasilar atelectasis. No pleural effusion or pneumothorax. IMPRESSION: 1. Lines and tubes including right IJ pacer lead in stable position. 2. Low lung volumes with mild bibasilar  atelectasis. Electronically Signed   By: Marcello Moores  Register   On: 04/10/2020 06:36    Cardiac Studies   03/28/2020: TTE IMPRESSIONS  1. Left ventricular ejection fraction, by estimation, is 60 to 65%. The  left ventricle has normal function. The left ventricle has no regional  wall motion abnormalities. There is mild concentric left ventricular  hypertrophy. Left ventricular diastolic  function could not be evaluated.  2. Right ventricular systolic function is normal. The right ventricular  size is normal.  3. The mitral valve is grossly normal. Trivial mitral valve  regurgitation.  4. The aortic valve is tricuspid. Aortic valve regurgitation is not  visualized. No aortic stenosis is present.    Assessment & Plan    Mr. Hirschman is a 74 year old man admitted with HHS and complete heart block requiring temporary pacemaker implant.  No clear reversible cause of his complete heart block.  I discussed the indication for permanent pacing with his next of kin, [name] (sister), who is in agreement with the plan.  His ejection fraction is 60% so plan will be to implant a dual-chamber permanent pacemaker.  1.  Complete heart block Plan for permanent pacemaker today Low grade temp last night 100.7 (resolved), mild leukocytosis (though down from admission) atelectasias on cxr BP stable Remains on vent  Dr. Lovena Le has communicated with sister over the weekend who is agreeable and his son Ysidro Evert this morning who is also agreeable to proceed.   2.  HHS Resolved.  Treatment per primary team.  3.  AKI Creat today 1.84   For questions or updates, please contact [place] Please consult [email or web address] for contact info under Cardiology/STEMI.      Signed, Lars Mage, MD  04/10/2020, 7:21 AM

## 2020-04-10 NOTE — Progress Notes (Signed)
NAME:  Jordan [name], MRN:  [id number], DOB:  29-Aug-1945, LOS: 3 ADMISSION DATE:  04/07/2020, CONSULTATION DATE:  04/07/20 REFERRING MD:  Jordan [name], CHIEF COMPLAINT:  stroke   Brief History:  Jordan [name] is a 74 yo M w/ PMH of Dm, HTN, CKD, CAD presenting to [facility] after being found down. Found to be altered. Intubated for altered mental status  History of Present Illness:  Jordan [name] is a 74 yo M w/ PMH listed below presenting to [facility] after being found down at home. On arrival he was noted to be altered, minimally responsive with inability to follow commands. He is unable to provide history.  History obtained via chart review. EMS pushed emergency call from home around midnight. Patient was unable to be accessed by EMS. Police was called and he was found him at home down for unknown duration. He was brought in as code stroke due to left neglect but was found to be bradycardic with HR in 30s. He was given atropine, cardiology was consulted and intubated. He was also found to be in HHS with cbg >500. Insulin gtt started and PCCM consulted for admission.  Past Medical History:   Past Medical History:  Diagnosis Date  . BPH (benign prostatic hyperplasia)   . CVA (cerebral vascular accident) (HCC) 10/23/2017   CT confirms subacute R parietal infarct  . GERD (gastroesophageal reflux disease)   . High cholesterol   . Hypertension    Significant Hospital Events:  04/07/20 Present to [facility]  Consults:  Neurology, Cardiology  Procedures:  EEG 12/17 >>  Temporary pacemaker placement 12/17 >>   Significant Diagnostic Tests:  04/07/20 CT CERVICAL SPINE WO CONTRAST IMPRESSION: Negative for acute fracture or malalignment of the cervical spine. Degenerative changes as above.  04/07/20 CT Head IMPRESSION: No acute intracranial hemorrhage or evidence of acute infarction. Multiple chronic infarcts.  Chronic microvascular ischemic changes.  04/07/20 MR Brain IMPRESSION: No acute infarction. No  proximal intracranial vessel occlusion.  EEG 12/17 Moderate to severe diffuse encephalopathy without any epileptiform discharges or seizure activity (on propofol)  Echocardiogram 12/17 LVEF 60-65%, normal function.  No regional wall motion abnormality.  Mild concentric LVH.  RV size and function normal   Micro Data:  12/17 COVID, FLU neg  Antimicrobials:    Interim History / Subjective:  No reported issues overnight Patient being transferred to Cath Lab this a.m. for pacemaker placement Currently 4 L positive  Objective   Blood pressure 107/66, pulse 60, temperature 97.9 F (36.6 C), temperature source Oral, resp. rate 12, height 5\' 11"  (1.803 m), weight 79.8 kg, SpO2 100 %.    Vent Mode: PRVC FiO2 (%):  [40 %] 40 % Set Rate:  [12 bmp] 12 bmp Vt Set:  [500 mL] 500 mL PEEP:  [5 cmH20] 5 cmH20 Plateau Pressure:  [15 cmH20-16 cmH20] 16 cmH20   Intake/Output Summary (Last 24 hours) at 04/10/2020 0657 Last data filed at 04/10/2020 0600 Gross per 24 hour  Intake 1502.45 ml  Output 1495 ml  Net 7.45 ml   Filed Weights   04/08/20 0300 04/09/20 0635 04/10/20 0510  Weight: 74.8 kg 74.8 kg 79.8 kg    Examination: General: Acute on chronic ill-appearing elderly gentleman lying in bed sedated on ventilator no acute distress HEENT: ETT, MM pink/moist, PERRL,  Neuro: Sedated on ventilator, currently receiving propofol infusion CV: Sinus rhythm, paced s1s2 regular rate and rhythm, no murmur, rubs, or gallops,  PULM: Clear to auscultation bilaterally, mechanical breath sounds, tolerating ventilator, no increased work  of breathing GI: soft, bowel sounds active in all 4 quadrants, non-tender, non-distended Extremities: warm/dry, no edema  Skin: no rashes or lesions  Resolved Hospital Problem list     Assessment & Plan:   HHS with Hx of Pre-diabetes  -Last known hgb a1c in pre-diabetes range at 6.4. Not on diabetic meds at home. Admit cbg 540. Gap of 20. Elevated serum osm at  343 with no acidosis (pH 7.37) Consistent with HHS. Started on insulin gtt in ED. P: Insulin drip discontinued 12/19 Trend Bmets  Accu-Cheks every 4 hour Monitor renal function Continue SSI and long acting insulin  Complete Heart Block -3rd degree heart block on EKG. Cardiology on board. Pacers placed. Given atropine in ED. He does not have an underlying rhythm when his temporary pacemaker wire does not capture. P: Cardiology consulted and following  Plan for placement of pacemaker 12/20 Continuous telemetry  Continue to hold home caredilol  Acute encephalopathy due to above -Likely multi-factorial due to HHS + symptomatic bradycardia. Intubated and sedated with fentanyl gtt. ABG showing no evidence of respiratory distress. Mild resp alkalosis due to tachypnea P: Minimize sedation as able  Delirium precautions  Correct underlying cause   Acute respiratory failure, principally due to encephalopathy, altered mental status and acidosis -Plan to leave him intubated, ventilated until he gets his pacemaker placed on 12/20. Then we can start to perform spontaneous breathing trials.  His acidosis and encephalopathy are both improved so hopefully he will extubate quickly P: Continue ventilator support with lung protective strategies  Wean PEEP and FiO2 for sats greater than 90%. Once pacemaker in place will wean sedation and place on SBT if patient can tolerate and mentation allows can likely extubate post pacemaker placement Head of bed elevated 30 degrees. Plateau pressures less than 30 cm H20.  Follow intermittent chest x-ray and ABG.   Ensure adequate pulmonary hygiene  Follow cultures  VAP bundle in place  PAD protocol  Acute Kidney Injury on Chronic Kidney Disease, improving -in the setting of above . Baseline creatinine 1.30 - 1.70, creatinine on admission 3.06 P: Follow renal function / urine output Trend Bmet Avoid nephrotoxins Ensure adequate renal perfusion   CAD -Cath  in 01/2020 with non-obstructive cad P:  Continue Rosuvastain and Asprin   Best practice (evaluated daily)  Diet: NPO Pain/Anxiety/Delirium protocol (if indicated): fentanyl, precedex VAP protocol (if indicated): Y DVT prophylaxis: subqhep GI prophylaxis: PPI Glucose control: SSI Mobility: BR Disposition:ICU  Goals of Care:  Last date of multidisciplinary goals of care discussion: Pending Family and staff present: Pending Summary of discussion: Pending Follow up goals of care discussion due: Pending Code Status: Full  Labs   CBC: Recent Labs  Lab 04/07/20 0915 04/07/20 0922 04/07/20 1907 04/07/20 2336 04/08/20 0259 04/09/20 0250 04/10/20 0331  WBC 17.7*  --  16.1*  --   --  12.4* 13.8*  NEUTROABS 14.3*  --   --   --   --   --   --   HGB 14.7   < > 13.2 12.2* 12.2* 11.4* 10.7*  HCT 47.3   < > 41.1 36.0* 36.0* 36.8* 34.3*  MCV 92.9  --  89.7  --   --  93.9 93.7  PLT 263  --  235  --   --  143* 141*   < > = values in this interval not displayed.    Basic Metabolic Panel: Recent Labs  Lab 04/08/20 0439 04/08/20 1113 04/08/20 1510 04/08/20 2102 04/09/20 0250 04/10/20  0331  NA 145 143 144 145 144 146*  K 3.7 3.6 3.6 3.7 4.3 3.5  CL 109 108 112* 109 112* 110  CO2 27 26 24 27 25 27   GLUCOSE 232* 204* 200* 267* 199* 111*  BUN 36* 32* 29* 27* 25* 23  CREATININE 2.03* 1.95* 1.63* 1.70* 1.62* 1.84*  CALCIUM 9.0 8.8* 8.1* 8.7* 8.5* 8.4*  MG 2.6*  --   --   --   --  2.5*  PHOS 1.9*  --   --   --   --   --    GFR: Estimated Creatinine Clearance: 37.5 mL/min (A) (by C-G formula based on SCr of 1.84 mg/dL (H)). Recent Labs  Lab 04/07/20 0915 04/07/20 1907 04/09/20 0250 04/10/20 0331  WBC 17.7* 16.1* 12.4* 13.8*    Liver Function Tests: Recent Labs  Lab 04/07/20 0915  AST 39  ALT 34  ALKPHOS 59  BILITOT 1.2  PROT 7.3  ALBUMIN 3.3*   No results for input(s): LIPASE, AMYLASE in the last 168 hours. Recent Labs  Lab 04/07/20 1907  AMMONIA 28    ABG     Component Value Date/Time   PHART 7.429 04/08/2020 0259   PCO2ART 40.9 04/08/2020 0259   PO2ART 179 (H) 04/08/2020 0259   HCO3 26.8 04/08/2020 0259   TCO2 28 04/08/2020 0259   ACIDBASEDEF 2.0 04/07/2020 1116   O2SAT 100.0 04/08/2020 0259     Coagulation Profile: Recent Labs  Lab 04/07/20 0947 04/10/20 0331  INR 1.2 1.3*    Cardiac Enzymes: Recent Labs  Lab 04/07/20 0915  CKTOTAL 3,471*    HbA1C: Hgb A1c MFr Bld  Date/Time Value Ref Range Status  04/07/2020 09:15 AM 11.9 (H) 4.8 - 5.6 % Final    Comment:    (NOTE) Pre diabetes:          5.7%-6.4%  Diabetes:              >6.4%  Glycemic control for   <7.0% adults with diabetes   10/24/2017 03:57 AM 6.4 (H) 4.8 - 5.6 % Final    Comment:    (NOTE) Pre diabetes:          5.7%-6.4% Diabetes:              >6.4% Glycemic control for   <7.0% adults with diabetes     CBG: Recent Labs  Lab 04/09/20 1325 04/09/20 1503 04/09/20 2037 04/09/20 2357 04/10/20 0340  GLUCAP 156* 154* 128* 122* 88    Critical care time:   CRITICAL CARE Performed by: 04/12/20  Total critical care time: 37 minutes  Critical care time was exclusive of separately billable procedures and treating other patients.  Critical care was necessary to treat or prevent imminent or life-threatening deterioration.  Critical care was time spent personally by me on the following activities: development of treatment plan with patient and/or surrogate as well as nursing, discussions with consultants, evaluation of patient's response to treatment, examination of patient, obtaining history from patient or surrogate, ordering and performing treatments and interventions, ordering and review of laboratory studies, ordering and review of radiographic studies, pulse oximetry and re-evaluation of patient's condition.

## 2020-04-11 ENCOUNTER — Inpatient Hospital Stay (HOSPITAL_COMMUNITY): Payer: Medicare HMO

## 2020-04-11 DIAGNOSIS — I639 Cerebral infarction, unspecified: Secondary | ICD-10-CM

## 2020-04-11 LAB — CBC
HCT: 33.2 % — ABNORMAL LOW (ref 39.0–52.0)
Hemoglobin: 10.2 g/dL — ABNORMAL LOW (ref 13.0–17.0)
MCH: 29.1 pg (ref 26.0–34.0)
MCHC: 30.7 g/dL (ref 30.0–36.0)
MCV: 94.6 fL (ref 80.0–100.0)
Platelets: 144 10*3/uL — ABNORMAL LOW (ref 150–400)
RBC: 3.51 MIL/uL — ABNORMAL LOW (ref 4.22–5.81)
RDW: 13.8 % (ref 11.5–15.5)
WBC: 13.3 10*3/uL — ABNORMAL HIGH (ref 4.0–10.5)
nRBC: 0 % (ref 0.0–0.2)

## 2020-04-11 LAB — BASIC METABOLIC PANEL
Anion gap: 11 (ref 5–15)
BUN: 24 mg/dL — ABNORMAL HIGH (ref 8–23)
CO2: 22 mmol/L (ref 22–32)
Calcium: 8.1 mg/dL — ABNORMAL LOW (ref 8.9–10.3)
Chloride: 109 mmol/L (ref 98–111)
Creatinine, Ser: 1.8 mg/dL — ABNORMAL HIGH (ref 0.61–1.24)
GFR, Estimated: 39 mL/min — ABNORMAL LOW (ref >60)
Glucose, Bld: 224 mg/dL — ABNORMAL HIGH (ref 70–99)
Potassium: 3.8 mmol/L (ref 3.5–5.1)
Sodium: 142 mmol/L (ref 135–145)

## 2020-04-11 LAB — GLUCOSE, CAPILLARY
Glucose-Capillary: 167 mg/dL — ABNORMAL HIGH (ref 70–99)
Glucose-Capillary: 191 mg/dL — ABNORMAL HIGH (ref 70–99)
Glucose-Capillary: 172 mg/dL — ABNORMAL HIGH (ref 70–99)
Glucose-Capillary: 185 mg/dL — ABNORMAL HIGH (ref 70–99)
Glucose-Capillary: 182 mg/dL — ABNORMAL HIGH (ref 70–99)

## 2020-04-11 MED ORDER — INSULIN ASPART 100 UNIT/ML ~~LOC~~ SOLN
0.0000 [IU] | SUBCUTANEOUS | Status: DC
  Administered 2020-04-11 – 2020-04-12 (×5): 2 [IU] via SUBCUTANEOUS

## 2020-04-11 MED ORDER — ORAL CARE MOUTH RINSE
15.0000 mL | Freq: Two times a day (BID) | OROMUCOSAL | Status: DC
  Administered 2020-04-11 – 2020-04-19 (×12): 15 mL via OROMUCOSAL

## 2020-04-11 MED FILL — Cefazolin Sodium-Dextrose IV Solution 2 GM/100ML-4%: INTRAVENOUS | Qty: 100 | Status: AC

## 2020-04-11 NOTE — Progress Notes (Signed)
Progress Note  Patient Name: Jordan [name] Date of Encounter: 04/11/2020  Primary Cardiologist: No primary care provider on file.   Subjective   Remains intubated, awake and following commands  Inpatient Medications    Scheduled Meds: . aspirin  81 mg Per Tube Daily  . chlorhexidine gluconate (MEDLINE KIT)  15 mL Mouth Rinse BID  . Chlorhexidine Gluconate Cloth  6 each Topical Daily  . docusate  100 mg Per Tube BID  . mouth rinse  15 mL Mouth Rinse 10 times per day  . mupirocin ointment  1 application Nasal BID  . pantoprazole sodium  40 mg Per Tube Daily  . polyethylene glycol  17 g Per Tube Daily  . rosuvastatin  20 mg Per Tube Daily  . sodium chloride flush  10-40 mL Intracatheter Q12H   Continuous Infusions: . dexmedetomidine (PRECEDEX) IV infusion 0.1 mcg/kg/hr (04/11/20 0700)  . dextrose 5 % and 0.9% NaCl 10 mL/hr at 04/11/20 0700  . lactated ringers 10 mL/hr at 04/11/20 0700  . propofol (DIPRIVAN) infusion Stopped (04/10/20 1530)   PRN Meds: acetaminophen, dextrose, fentaNYL (SUBLIMAZE) injection, fentaNYL (SUBLIMAZE) injection, hydrALAZINE, ondansetron (ZOFRAN) IV, sodium chloride flush   Vital Signs    Vitals:   04/11/20 0500 04/11/20 0600 04/11/20 0700 04/11/20 0729  BP: (!) 111/54 (!) 164/61 (!) 129/58   Pulse: 60 (!) 59 60   Resp: '12 12 12   ' Temp:    97.7 F (36.5 C)  TempSrc:      SpO2: 100% 100% 100%   Weight:      Height:        Intake/Output Summary (Last 24 hours) at 04/11/2020 0747 Last data filed at 04/11/2020 0700 Gross per 24 hour  Intake 976.98 ml  Output 1130 ml  Net -153.02 ml   Filed Weights   04/08/20 0300 04/09/20 0635 04/10/20 0510  Weight: 74.8 kg 74.8 kg 79.8 kg    Telemetry    AV pacing- Personally Reviewed  ECG    AV paced - Personally Reviewed  Physical Exam   GEN:  awake and following commands Neck: No JVD Cardiac:  RRR, no murmurs, gallops or rubs.    Respiratory: CTA remains on vent GI: Soft, nontender,  non-distended  MS: No edema; No deformity. Neuro:  Sedated Psych: Sedated  L chest: PPM site dressing is CDI, no hematoma  Labs    Chemistry Recent Labs  Lab 04/07/20 0915 04/07/20 0922 04/09/20 0250 04/10/20 0331 04/11/20 0357  NA 138   < > 144 146* 142  K 5.2*   < > 4.3 3.5 3.8  CL 100   < > 112* 110 109  CO2 21*   < > '25 27 22  ' GLUCOSE 542*   < > 199* 111* 224*  BUN 44*   < > 25* 23 24*  CREATININE 3.06*   < > 1.62* 1.84* 1.80*  CALCIUM 9.9   < > 8.5* 8.4* 8.1*  PROT 7.3  --   --   --   --   ALBUMIN 3.3*  --   --   --   --   AST 39  --   --   --   --   ALT 34  --   --   --   --   ALKPHOS 59  --   --   --   --   BILITOT 1.2  --   --   --   --   GFRNONAA 21*   < >  44* 38* 39*  ANIONGAP 17*   < > '7 9 11   ' < > = values in this interval not displayed.     Hematology Recent Labs  Lab 04/09/20 0250 04/10/20 0331 04/11/20 0357  WBC 12.4* 13.8* 13.3*  RBC 3.92* 3.66* 3.51*  HGB 11.4* 10.7* 10.2*  HCT 36.8* 34.3* 33.2*  MCV 93.9 93.7 94.6  MCH 29.1 29.2 29.1  MCHC 31.0 31.2 30.7  RDW 13.7 13.7 13.8  PLT 143* 141* 144*    Cardiac EnzymesNo results for input(s): TROPONINI in the last 168 hours. No results for input(s): TROPIPOC in the last 168 hours.   BNPNo results for input(s): BNP, PROBNP in the last 168 hours.   DDimer No results for input(s): DDIMER in the last 168 hours.   Radiology    04/11/2020: CXR IMPRESSION: 1. Interim removal of right IJ pacer and placement of cardiac pacemaker. Cardiac pacemaker with lead tips in the right atrium and right ventricle. Remaining lines and tubes in stable position. 2. Bibasilar atelectasis.  Cardiac Studies   03/28/2020: TTE IMPRESSIONS  1. Left ventricular ejection fraction, by estimation, is 60 to 65%. The  left ventricle has normal function. The left ventricle has no regional  wall motion abnormalities. There is mild concentric left ventricular  hypertrophy. Left ventricular diastolic  function could not  be evaluated.  2. Right ventricular systolic function is normal. The right ventricular  size is normal.  3. The mitral valve is grossly normal. Trivial mitral valve  regurgitation.  4. The aortic valve is tricuspid. Aortic valve regurgitation is not  visualized. No aortic stenosis is present.     01/28/2020 LHC  Mid LAD lesion is 25% stenosed.  Prox RCA lesion is 25% stenosed.   Normal coronaries.    Assessment & Plan    Mr. Aloia is a 74 year old man admitted with HHS and complete heart block requiring temporary pacemaker implant.  No clear reversible cause of his complete heart block.  I discussed the indication for permanent pacing with his next of kin, Jordan [name]'s (sister), who is in agreement with the plan.  His ejection fraction is 60% so plan will be to implant a dual-chamber permanent pacemaker.  1.  Complete heart block S/p PPM implant yesterday with Dr. Quentin [name] Site is stable, dressing CDI Device check this AM with stable findings CXR this am without ptx  Will leave tegaderm on for now, remove prior to discharge SCDs for DVT prophylaxis Routine post pacer implant follow up is in place  EP will circle back once extubated to provide post op instructions   Continue further as per attending team  2. Respiratory failure     In d/w night staff, likely to extubate today     His is awake and following commands  3.  HHS Treatment per primary team.  4.  AKI Creat today 1.80 (was 3.06 on admission)  5. HTN   For questions or updates, please contact Jordan [name] Please consult [email or web address] for contact info under Cardiology/STEMI.      SignedTommye Standard, PA-C 04/11/2020, 7:47 AM

## 2020-04-11 NOTE — Procedures (Signed)
Extubation Procedure Note  Patient Details:   Name: Jordan [name] DOB: December 25, 1945 MRN: [id number]   Airway Documentation:    Vent end date: 04/11/20 Vent end time: 1542   Evaluation  O2 sats: stable throughout  Complications: No apparent complications Patient did tolerate procedure well. Bilateral Breath Sounds: Clear   Yes   Patient extubated per MD order at this time. Able to vocalize and clear secretions. RT to monitor as needed  Lurlean Leyden 04/11/2020, 3:44 PM

## 2020-04-11 NOTE — Progress Notes (Addendum)
NAME:  Jordan [name], MRN:  [id number], DOB:  [birth year], LOS: 4 ADMISSION DATE:  04/07/2020, CONSULTATION DATE:  04/07/20 REFERRING MD:  Particia Nearing, CHIEF COMPLAINT:  stroke   Brief History:  Jordan [name] is a 74 yo M w/ PMH of Dm, HTN, CKD, CAD presenting to [facility] after being found down. Found to be altered. Intubated for altered mental status  History of Present Illness:  Jordan [name] is a 74 yo M w/ PMH listed below presenting to [facility] after being found down at home. On arrival he was noted to be altered, minimally responsive with inability to follow commands. He is unable to provide history.  History obtained via chart review. EMS pushed emergency call from home around midnight. Patient was unable to be accessed by EMS. Police was called and he was found him at home down for unknown duration. He was brought in as code stroke due to left neglect but was found to be bradycardic with HR in 30s. He was given atropine, cardiology was consulted and intubated. He was also found to be in HHS with cbg >500. Insulin gtt started and PCCM consulted for admission.  Past Medical History:   Past Medical History:  Diagnosis Date  . BPH (benign prostatic hyperplasia)   . CVA (cerebral vascular accident) (HCC) 10/23/2017   CT confirms subacute R parietal infarct  . GERD (gastroesophageal reflux disease)   . High cholesterol   . Hypertension    Significant Hospital Events:  04/07/20 Present to [facility]  Consults:  Neurology, Cardiology  Procedures:  EEG 12/17 >>  Temporary pacemaker placement 12/17 >>   Significant Diagnostic Tests:  04/07/20 CT CERVICAL SPINE WO CONTRAST IMPRESSION: Negative for acute fracture or malalignment of the cervical spine. Degenerative changes as above.  04/07/20 CT Head IMPRESSION: No acute intracranial hemorrhage or evidence of acute infarction. Multiple chronic infarcts.  Chronic microvascular ischemic changes.  04/07/20 MR Brain IMPRESSION: No acute infarction. No  proximal intracranial vessel occlusion.  EEG 12/17 Moderate to severe diffuse encephalopathy without any epileptiform discharges or seizure activity (on propofol)  Echocardiogram 12/17 LVEF 60-65%, normal function.  No regional wall motion abnormality.  Mild concentric LVH.  RV size and function normal   Micro Data:  12/17 COVID, FLU neg  Antimicrobials:    Interim History / Subjective:  No reported issues overnight Placement of perm pacemaker 04/10/2020 Currently V pacing>> personally reviewed Remains  4 L positive at present 800 cc UO over the last 24 hours Creatinine is 1.80 Pt off sedation, easily arousable, calm and following commands, currently weaning on 40%, 5/5, rate pf 24 Looks good for extubation T Max is 99.3>> CXR with Bibasilar atelectasis 12.21, WBC 13.3 CK down to 970 from 3471  Objective   Blood pressure (!) 163/56, pulse (!) 59, temperature 97.7 F (36.5 C), resp. rate 19, height 5\' 11"  (1.803 m), weight 79.8 kg, SpO2 100 %.    Vent Mode: CPAP;PSV FiO2 (%):  [40 %] 40 % Set Rate:  [12 bmp] 12 bmp Vt Set:  [500 mL] 500 mL PEEP:  [5 cmH20] 5 cmH20 Pressure Support:  [5 cmH20] 5 cmH20 Plateau Pressure:  [14 cmH20-16 cmH20] 16 cmH20   Intake/Output Summary (Last 24 hours) at 04/11/2020 0950 Last data filed at 04/11/2020 0900 Gross per 24 hour  Intake 968.73 ml  Output 1160 ml  Net -191.27 ml   Filed Weights   04/08/20 0300 04/09/20 0635 04/10/20 0510  Weight: 74.8 kg 74.8 kg 79.8 kg    Examination: General:  Acute on chronic ill-appearing elderly gentleman lying in bed sedated on ventilator no acute distress HEENT: ETT, MM pink/moist, PERRL, No LAD, No JVD Neuro: Off sedation, Intubated , on  ventilator, follows commands, nods to questions CV: Sinus rhythm, V- paced s1s2 regular rate and rhythm, no murmur, rubs, or gallops,  PULM: Bilateral chest excursion, Clear to auscultation bilaterally, mechanical breath sounds, weaning  On 40%, 5/5, no  increased work of breathing, good volumes GI: soft, bowel sounds active in all 4 quadrants, non-tender, non-distended Extremities: warm/dry, no edema , no obvious deformities Skin: no rashes or lesions  Resolved Hospital Problem list     Assessment & Plan:   HHS with Hx of Pre-diabetes  -Last known HGB  a1c in pre-diabetes range at 6.4. Not on diabetic meds at home. Admit cbg 540. Gap of 20. Elevated serum osm at 343 with no acidosis (pH 7.37) Consistent with HHS. Started on insulin gtt in ED. 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 Cardiology on board. Pacers placed. Given atropine in ED. He does not have an underlying rhythm when his temporary pacemaker wire does not capture. P: Cardiology consulted and following  Successful  placement of perm pacemaker 12/20 Continuous telemetry  Continue to hold home caredilol  Acute encephalopathy due to above -Likely multi-factorial due to HHS + symptomatic bradycardia. Intubated and sedated with fentanyl gtt.  ABG showing no evidence of respiratory distress. Mild resp alkalosis due to tachypnea P: Minimize sedation as able  Delirium precautions  Correct underlying cause   Acute respiratory failure, principally due to encephalopathy, altered mental status and acidosis -Plan to leave him intubated, ventilated until he gets his pacemaker placed on 12/20. Then we can start to perform spontaneous breathing trials.  His acidosis and encephalopathy are both improved so hopefully he will extubate quickly P: Tolerating CPAP PS of 5/5 with good volumes Hopeful for extubation today Head of bed elevated 30 degrees. Plateau pressures less than 30 cm H20.  Follow intermittent chest x-ray and ABG.   Ensure adequate pulmonary hygiene  Follow cultures  VAP bundle in place  PAD protocol  Acute  Kidney Injury on Chronic Kidney Disease, improving -in the setting of above . Baseline creatinine 1.30 - 1.70, creatinine on admission 3.06 12/21>> Down to 1.80 P: Follow renal function / urine output Trend Bmet Avoid nephrotoxins Ensure adequate renal perfusion   CAD -Cath in 01/2020 with non-obstructive cad P:  Continue Rosuvastain and Asprin   Best practice (evaluated daily)  Diet: NPO Pain/Anxiety/Delirium protocol (if indicated): fentanyl, precedex VAP protocol (if indicated): Y DVT prophylaxis: subqhep GI prophylaxis: PPI Glucose control: SSI Mobility: BR Disposition:ICU  Goals of Care:  Last date of multidisciplinary goals of care discussion: Pending Family and staff present: Pending Summary of discussion: Pending Follow up goals of care discussion due: Pending Code Status: Full  Labs   CBC: Recent Labs  Lab 04/07/20 0915 04/07/20 0922 04/07/20 1907 04/07/20 2336 04/08/20 0259 04/09/20 0250 04/10/20 0331 04/11/20 0357  WBC 17.7*  --  16.1*  --   --  12.4* 13.8* 13.3*  NEUTROABS 14.3*  --   --   --   --   --   --   --   HGB 14.7   < > 13.2 12.2* 12.2* 11.4* 10.7* 10.2*  HCT 47.3   < > 41.1 36.0* 36.0*  36.8* 34.3* 33.2*  MCV 92.9  --  89.7  --   --  93.9 93.7 94.6  PLT 263  --  235  --   --  143* 141* 144*   < > = values in this interval not displayed.    Basic Metabolic Panel: Recent Labs  Lab 04/08/20 0439 04/08/20 1113 04/08/20 1510 04/08/20 2102 04/09/20 0250 04/10/20 0331 04/11/20 0357  NA 145   < > 144 145 144 146* 142  K 3.7   < > 3.6 3.7 4.3 3.5 3.8  CL 109   < > 112* 109 112* 110 109  CO2 27   < > 24 27 25 27 22   GLUCOSE 232*   < > 200* 267* 199* 111* 224*  BUN 36*   < > 29* 27* 25* 23 24*  CREATININE 2.03*   < > 1.63* 1.70* 1.62* 1.84* 1.80*  CALCIUM 9.0   < > 8.1* 8.7* 8.5* 8.4* 8.1*  MG 2.6*  --   --   --   --  2.5*  --   PHOS 1.9*  --   --   --   --   --   --    < > = values in this interval not displayed.   GFR: Estimated  Creatinine Clearance: 38.3 mL/min (A) (by C-G formula based on SCr of 1.8 mg/dL (H)). Recent Labs  Lab 04/07/20 1907 04/09/20 0250 04/10/20 0331 04/11/20 0357  WBC 16.1* 12.4* 13.8* 13.3*    Liver Function Tests: Recent Labs  Lab 04/07/20 0915  AST 39  ALT 34  ALKPHOS 59  BILITOT 1.2  PROT 7.3  ALBUMIN 3.3*   No results for input(s): LIPASE, AMYLASE in the last 168 hours. Recent Labs  Lab 04/07/20 1907  AMMONIA 28    ABG    Component Value Date/Time   PHART 7.429 04/08/2020 0259   PCO2ART 40.9 04/08/2020 0259   PO2ART 179 (H) 04/08/2020 0259   HCO3 26.8 04/08/2020 0259   TCO2 28 04/08/2020 0259   ACIDBASEDEF 2.0 04/07/2020 1116   O2SAT 100.0 04/08/2020 0259     Coagulation Profile: Recent Labs  Lab 04/07/20 0947 04/10/20 0331  INR 1.2 1.3*    Cardiac Enzymes: Recent Labs  Lab 04/07/20 0915 04/10/20 0331  CKTOTAL 3,471* 970*  CKMB  --  3.2    HbA1C: Hgb A1c MFr Bld  Date/Time Value Ref Range Status  04/07/2020 09:15 AM 11.9 (H) 4.8 - 5.6 % Final    Comment:    (NOTE) Pre diabetes:          5.7%-6.4%  Diabetes:              >6.4%  Glycemic control for   <7.0% adults with diabetes   10/24/2017 03:57 AM 6.4 (H) 4.8 - 5.6 % Final    Comment:    (NOTE) Pre diabetes:          5.7%-6.4% Diabetes:              >6.4% Glycemic control for   <7.0% adults with diabetes     CBG: Recent Labs  Lab 04/10/20 1517 04/10/20 2000 04/10/20 2336 04/11/20 0331 04/11/20 0728  GLUCAP 117* 113* 123* 167* 191*    Critical care time: 35 minutes  CRITICAL CARE Performed by: Bevelyn NgoSarah F Jamar Casagrande  Total critical care time: 35 minutes  Critical care time was exclusive of separately billable procedures and treating other patients.  Critical care was necessary to treat or prevent  imminent or life-threatening deterioration.  Critical care was time spent personally by me on the following activities: development of treatment plan with patient and/or surrogate  as well as nursing, discussions with consultants, evaluation of patient's response to treatment, examination of patient, obtaining history from patient or surrogate, ordering and performing treatments and interventions, ordering and review of laboratory studies, ordering and review of radiographic studies, pulse oximetry and re-evaluation of patient's condition  Bevelyn Ngo, MSN, AGACNP-BC [facility] See Amion for personal pager PCCM on call pager [phone]. 04/11/2020 9:51 AM

## 2020-04-12 ENCOUNTER — Inpatient Hospital Stay (HOSPITAL_COMMUNITY): Payer: Medicare HMO

## 2020-04-12 DIAGNOSIS — I442 Atrioventricular block, complete: Secondary | ICD-10-CM | POA: Diagnosis not present

## 2020-04-12 LAB — COMPREHENSIVE METABOLIC PANEL WITH GFR
ALT: 34 U/L (ref 0–44)
AST: 47 U/L — ABNORMAL HIGH (ref 15–41)
Albumin: 1.6 g/dL — ABNORMAL LOW (ref 3.5–5.0)
Alkaline Phosphatase: 63 U/L (ref 38–126)
Anion gap: 11 (ref 5–15)
BUN: 30 mg/dL — ABNORMAL HIGH (ref 8–23)
CO2: 22 mmol/L (ref 22–32)
Calcium: 8.6 mg/dL — ABNORMAL LOW (ref 8.9–10.3)
Chloride: 115 mmol/L — ABNORMAL HIGH (ref 98–111)
Creatinine, Ser: 1.85 mg/dL — ABNORMAL HIGH (ref 0.61–1.24)
GFR, Estimated: 38 mL/min — ABNORMAL LOW
Glucose, Bld: 197 mg/dL — ABNORMAL HIGH (ref 70–99)
Potassium: 3.7 mmol/L (ref 3.5–5.1)
Sodium: 148 mmol/L — ABNORMAL HIGH (ref 135–145)
Total Bilirubin: 0.8 mg/dL (ref 0.3–1.2)
Total Protein: 5.3 g/dL — ABNORMAL LOW (ref 6.5–8.1)

## 2020-04-12 LAB — CBC
HCT: 34.1 % — ABNORMAL LOW (ref 39.0–52.0)
Hemoglobin: 10.8 g/dL — ABNORMAL LOW (ref 13.0–17.0)
MCH: 29.5 pg (ref 26.0–34.0)
MCHC: 31.7 g/dL (ref 30.0–36.0)
MCV: 93.2 fL (ref 80.0–100.0)
Platelets: 183 K/uL (ref 150–400)
RBC: 3.66 MIL/uL — ABNORMAL LOW (ref 4.22–5.81)
RDW: 13.8 % (ref 11.5–15.5)
WBC: 11.3 K/uL — ABNORMAL HIGH (ref 4.0–10.5)
nRBC: 0 % (ref 0.0–0.2)

## 2020-04-12 LAB — GLUCOSE, CAPILLARY
Glucose-Capillary: 168 mg/dL — ABNORMAL HIGH (ref 70–99)
Glucose-Capillary: 176 mg/dL — ABNORMAL HIGH (ref 70–99)
Glucose-Capillary: 198 mg/dL — ABNORMAL HIGH (ref 70–99)
Glucose-Capillary: 200 mg/dL — ABNORMAL HIGH (ref 70–99)
Glucose-Capillary: 262 mg/dL — ABNORMAL HIGH (ref 70–99)
Glucose-Capillary: 276 mg/dL — ABNORMAL HIGH (ref 70–99)

## 2020-04-12 LAB — CK: Total CK: 469 U/L — ABNORMAL HIGH (ref 49–397)

## 2020-04-12 LAB — MAGNESIUM: Magnesium: 2.7 mg/dL — ABNORMAL HIGH (ref 1.7–2.4)

## 2020-04-12 MED ORDER — INSULIN ASPART 100 UNIT/ML ~~LOC~~ SOLN
0.0000 [IU] | Freq: Three times a day (TID) | SUBCUTANEOUS | Status: DC
  Administered 2020-04-12: 17:00:00 5 [IU] via SUBCUTANEOUS
  Administered 2020-04-13 (×2): 3 [IU] via SUBCUTANEOUS
  Administered 2020-04-13: 16:00:00 7 [IU] via SUBCUTANEOUS
  Administered 2020-04-14: 06:00:00 3 [IU] via SUBCUTANEOUS
  Administered 2020-04-14: 12:00:00 2 [IU] via SUBCUTANEOUS
  Administered 2020-04-14: 18:00:00 1 [IU] via SUBCUTANEOUS
  Administered 2020-04-15: 07:00:00 2 [IU] via SUBCUTANEOUS
  Administered 2020-04-15: 16:00:00 5 [IU] via SUBCUTANEOUS
  Administered 2020-04-15 – 2020-04-16 (×2): 2 [IU] via SUBCUTANEOUS
  Administered 2020-04-17: 17:00:00 1 [IU] via SUBCUTANEOUS
  Administered 2020-04-17: 13:00:00 3 [IU] via SUBCUTANEOUS
  Administered 2020-04-17: 07:00:00 1 [IU] via SUBCUTANEOUS
  Administered 2020-04-18 – 2020-04-19 (×5): 2 [IU] via SUBCUTANEOUS
  Administered 2020-04-19: 18:00:00 1 [IU] via SUBCUTANEOUS

## 2020-04-12 MED ORDER — POLYETHYLENE GLYCOL 3350 17 G PO PACK
17.0000 g | PACK | Freq: Every day | ORAL | Status: DC
  Administered 2020-04-15: 10:00:00 17 g via ORAL
  Filled 2020-04-12 (×3): qty 1

## 2020-04-12 MED ORDER — ROSUVASTATIN CALCIUM 20 MG PO TABS
20.0000 mg | ORAL_TABLET | Freq: Every day | ORAL | Status: DC
  Administered 2020-04-13 – 2020-04-19 (×7): 20 mg via ORAL
  Filled 2020-04-12 (×7): qty 1

## 2020-04-12 MED ORDER — ASPIRIN 81 MG PO CHEW
81.0000 mg | CHEWABLE_TABLET | Freq: Every day | ORAL | Status: DC
  Administered 2020-04-13 – 2020-04-19 (×7): 81 mg via ORAL
  Filled 2020-04-12 (×7): qty 1

## 2020-04-12 MED ORDER — DOCUSATE SODIUM 50 MG/5ML PO LIQD
100.0000 mg | Freq: Two times a day (BID) | ORAL | Status: DC
  Administered 2020-04-12 – 2020-04-15 (×7): 100 mg via ORAL
  Filled 2020-04-12 (×10): qty 10

## 2020-04-12 MED ORDER — LIVING WELL WITH DIABETES BOOK
Freq: Once | Status: AC
  Filled 2020-04-12: qty 1

## 2020-04-12 MED ORDER — PANTOPRAZOLE SODIUM 40 MG PO TBEC
40.0000 mg | DELAYED_RELEASE_TABLET | Freq: Every day | ORAL | Status: DC
  Administered 2020-04-13 – 2020-04-19 (×7): 40 mg via ORAL
  Filled 2020-04-12 (×7): qty 1

## 2020-04-12 NOTE — Progress Notes (Signed)
Patient seen, AAO x3, denies any complaints His arm sling removed Wound care and activity instructions were discussed with the patient. I have made [employer] rep aware, they will see him to deliver transmitter and provide their education as well. Tegaderm to be left in place until day of discharge Discussed with nurse  Janean Sark, PA-C

## 2020-04-12 NOTE — Progress Notes (Signed)
PCCM Progress Note   Spoke with Dr. Butler Denmark and confirmed that [facility] hospitalist will assume primary care beginning 12/23.    Jordan Gant, NP-C [facility] Contact / Pager information can be found on Amion  04/12/2020, 8:55 AM

## 2020-04-12 NOTE — Progress Notes (Signed)
Inpatient Diabetes Program Recommendations  AACE/ADA: New Consensus Statement on Inpatient Glycemic Control (2015)  Target Ranges:  Prepandial:   less than 140 mg/dL      Peak postprandial:   less than 180 mg/dL (1-2 hours)      Critically ill patients:  140 - 180 mg/dL   Lab Results  Component Value Date   GLUCAP 168 (H) 04/12/2020   HGBA1C 11.9 (H) 04/07/2020    Review of Glycemic Control Results for Jordan [name], Jordan [name] (MRN [id number]) as of 04/12/2020 08:57  Ref. Range 04/11/2020 15:29 04/11/2020 20:50 04/12/2020 00:37 04/12/2020 04:44 04/12/2020 07:27  Glucose-Capillary Latest Ref Range: 70 - 99 mg/dL 492 (H) 010 (H) 071 (H) 176 (H) 168 (H)   Diabetes history:  DM2 (new onset) Current orders for Inpatient glycemic control:  Novolog 0-9 units TID  Note: Referral for new onset diabetes teaching.  Reached out to Rocky Link, RN asking if appropriate for teaching today.  He is waiting on speech and swallow study.  Will start teaching this afternoon or tomorrow.  Ordered Living Well with Diabetes booklet & diabetes education by staff.  Please use each patient interaction to provide diabetes education. Please review Living Well with Diabetes booklet with the patient, have patient watch patient education videos on diabetes, and instruct on insulin administration. Please allow patient to be actively engaged with diabetes management by allowing patient to check own glucose and self-administer insulin injections. Diabetes Coordinator will follow up with patient and reinforce diabetes education.  Will continue to follow while inpatient.  Thank you, Dulce Sellar, RN, BSN Diabetes Coordinator Inpatient Diabetes Program [phone] (team pager from 8a-5p)

## 2020-04-12 NOTE — Plan of Care (Signed)
  Problem: Clinical Measurements: Goal: Ability to maintain clinical measurements within normal limits will improve Outcome: Progressing   Problem: Clinical Measurements: Goal: Cardiovascular complication will be avoided Outcome: Progressing   Problem: Elimination: Goal: Will not experience complications related to bowel motility Outcome: Progressing   Problem: Pain Managment: Goal: General experience of comfort will improve Outcome: Progressing   Problem: Safety: Goal: Ability to remain free from injury will improve Outcome: Progressing   Problem: Skin Integrity: Goal: Risk for impaired skin integrity will decrease Outcome: Progressing   Problem: Cardiac: Goal: Ability to achieve and maintain adequate cardiopulmonary perfusion will improve Outcome: Progressing

## 2020-04-12 NOTE — Progress Notes (Addendum)
NAME:  Jordan [name], MRN:  [id number], DOB:  10-21-1945, LOS: 5 ADMISSION DATE:  04/07/2020, CONSULTATION DATE:  04/07/20 REFERRING MD:  Particia Nearing, CHIEF COMPLAINT:  stroke   Brief History:  Mr.Mallick is a 74 yo M w/ PMH of Dm, HTN, CKD, CAD presenting to [facility] after being found down. Found to be altered. Intubated for altered mental status  History of Present Illness:  Mr.Matusek is a 74 yo M w/ PMH listed below presenting to [facility] after being found down at home. On arrival he was noted to be altered, minimally responsive with inability to follow commands. He is unable to provide history.  History obtained via chart review. EMS pushed emergency call from home around midnight. Patient was unable to be accessed by EMS. Police was called and he was found him at home down for unknown duration. He was brought in as code stroke due to left neglect but was found to be bradycardic with HR in 30s. He was given atropine, cardiology was consulted and intubated. He was also found to be in HHS with cbg >500. Insulin gtt started and PCCM consulted for admission.  Past Medical History:   Past Medical History:  Diagnosis Date  . BPH (benign prostatic hyperplasia)   . CVA (cerebral vascular accident) (HCC) 10/23/2017   CT confirms subacute R parietal infarct  . GERD (gastroesophageal reflux disease)   . High cholesterol   . Hypertension    Significant Hospital Events:  04/07/20 Present to [facility]  Consults:  Neurology, Cardiology  Procedures:  EEG 12/17 >>  Temporary pacemaker placement 12/17 >>   Significant Diagnostic Tests:  04/07/20 CT CERVICAL SPINE WO CONTRAST IMPRESSION: Negative for acute fracture or malalignment of the cervical spine. Degenerative changes as above.  04/07/20 CT Head IMPRESSION: No acute intracranial hemorrhage or evidence of acute infarction. Multiple chronic infarcts.  Chronic microvascular ischemic changes.  04/07/20 MR Brain IMPRESSION: No acute infarction. No  proximal intracranial vessel occlusion.  EEG 12/17 Moderate to severe diffuse encephalopathy without any epileptiform discharges or seizure activity (on propofol)  Echocardiogram 12/17 LVEF 60-65%, normal function.  No regional wall motion abnormality.  Mild concentric LVH.  RV size and function normal   Micro Data:  12/17 COVID, FLU neg  Antimicrobials:    Interim History / Subjective:  No acute events overnight, continues to tolerate extubation well Managing secretions well Denies any pain Currently V paced  Objective   Blood pressure (!) 144/63, pulse 70, temperature 97.6 F (36.4 C), resp. rate (!) 25, height 5\' 11"  (1.803 m), weight 79.8 kg, SpO2 100 %.    Vent Mode: CPAP;PSV FiO2 (%):  [40 %] 40 % PEEP:  [5 cmH20] 5 cmH20 Pressure Support:  [5 cmH20-8 cmH20] 8 cmH20   Intake/Output Summary (Last 24 hours) at 04/12/2020 0748 Last data filed at 04/12/2020 0600 Gross per 24 hour  Intake 2391.82 ml  Output 840 ml  Net 1551.82 ml   Filed Weights   04/08/20 0300 04/09/20 0635 04/10/20 0510  Weight: 74.8 kg 74.8 kg 79.8 kg    Examination: General: Chronically ill appearing thin mildly deconditioned elderly male sitting up in bedside recliner on room air in no acute distress HEENT: [place]/AT, MM pink/moist, PERRL,  Neuro: Alert and oriented, nonfocal CV: s1s2 regular rate and rhythm, no murmur, rubs, or gallops,  PULM: Clear to auscultation bilaterally, no increased work of breathing, observed with oxygen saturations 100% on 3 and half liters nasal cannula, this was escalated to room air GI: soft,  bowel sounds active in all 4 quadrants, non-tender, non-distended Extremities: warm/dry, no edema  Skin: no rashes or lesions  Resolved Hospital Problem list   Acute respiratory failure, principally due to encephalopathy, altered mental status and acidosis -Extubated 12/21 HHS -Transitioned off insulin drip 12/19 Acute metabolic encephalopathy Acute respiratory failure,  principally due to encephalopathy, altered mental status and acidosis -Extubated 12/21  Assessment & Plan:   New onset diabetes -Last known HGB  a1c in pre-diabetes range at 6.4. Not on diabetic meds at home. Admit cbg 540. Gap of 20. Elevated serum osm at 343 with no acidosis (pH 7.37) Consistent with HHS. Started on insulin gtt in ED. 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 Cardiology on board. Pacers placed. Given atropine in ED. He does not have an underlying rhythm when his temporary pacemaker wire does not capture. P: Cardiology consulted and following Status post placement of permanent pacemaker 12/20 Continuous telemetry Hold home beta-blocker  Acute Kidney Injury on Chronic Kidney Disease, improving -in the setting of above . Baseline creatinine 1.30 - 1.70, creatinine on admission 3.06 P: Creatinine down trended to 1.85 Follow urine output Follow renal function Trend bmet Avoid nephrotoxins  CAD -Cath in 01/2020 with non-obstructive cad P:  Continue atorvastatin and aspirin  Pressure ulcers -Patient has pressure ulcers to bilateral heels P: Wound care per nursing Pressure relieving services Elevate feet while in bed or in chair  Best practice (evaluated daily)  Diet: NPO Pain/Anxiety/Delirium protocol (if indicated): fentanyl, precedex VAP protocol (if indicated): Y DVT prophylaxis: subqhep GI prophylaxis: PPI Glucose control: SSI Mobility: BR Disposition: Stable for transfer to telemetry floor, will ask TRH to assume care 12/23  Goals of Care:  Last date of multidisciplinary goals of care discussion: Pending Family and staff present: Pending Summary of discussion: Pending Follow up goals of care discussion due: Pending Code Status: Full  Labs   CBC: Recent Labs  Lab 04/07/20 0915 04/07/20 0922 04/07/20 1907  04/07/20 2336 04/08/20 0259 04/09/20 0250 04/10/20 0331 04/11/20 0357 04/12/20 0504  WBC 17.7*  --  16.1*  --   --  12.4* 13.8* 13.3* 11.3*  NEUTROABS 14.3*  --   --   --   --   --   --   --   --   HGB 14.7   < > 13.2   < > 12.2* 11.4* 10.7* 10.2* 10.8*  HCT 47.3   < > 41.1   < > 36.0* 36.8* 34.3* 33.2* 34.1*  MCV 92.9  --  89.7  --   --  93.9 93.7 94.6 93.2  PLT 263  --  235  --   --  143* 141* 144* 183   < > = values in this interval not displayed.    Basic Metabolic Panel: Recent Labs  Lab 04/08/20 0439 04/08/20 1113 04/08/20 2102 04/09/20 0250 04/10/20 0331 04/11/20 0357 04/12/20 0504  NA 145   < > 145 144 146* 142 148*  K 3.7   < > 3.7 4.3 3.5 3.8 3.7  CL 109   < > 109 112* 110 109 115*  CO2 27   < > 27 25 27 22 22   GLUCOSE 232*   < > 267* 199* 111* 224* 197*  BUN 36*   < > 27* 25* 23 24* 30*  CREATININE 2.03*   < > 1.70* 1.62*  1.84* 1.80* 1.85*  CALCIUM 9.0   < > 8.7* 8.5* 8.4* 8.1* 8.6*  MG 2.6*  --   --   --  2.5*  --  2.7*  PHOS 1.9*  --   --   --   --   --   --    < > = values in this interval not displayed.   GFR: Estimated Creatinine Clearance: 37.3 mL/min (A) (by C-G formula based on SCr of 1.85 mg/dL (H)). Recent Labs  Lab 04/09/20 0250 04/10/20 0331 04/11/20 0357 04/12/20 0504  WBC 12.4* 13.8* 13.3* 11.3*    Liver Function Tests: Recent Labs  Lab 04/07/20 0915 04/12/20 0504  AST 39 47*  ALT 34 34  ALKPHOS 59 63  BILITOT 1.2 0.8  PROT 7.3 5.3*  ALBUMIN 3.3* 1.6*   No results for input(s): LIPASE, AMYLASE in the last 168 hours. Recent Labs  Lab 04/07/20 1907  AMMONIA 28    ABG    Component Value Date/Time   PHART 7.429 04/08/2020 0259   PCO2ART 40.9 04/08/2020 0259   PO2ART 179 (H) 04/08/2020 0259   HCO3 26.8 04/08/2020 0259   TCO2 28 04/08/2020 0259   ACIDBASEDEF 2.0 04/07/2020 1116   O2SAT 100.0 04/08/2020 0259     Coagulation Profile: Recent Labs  Lab 04/07/20 0947 04/10/20 0331  INR 1.2 1.3*    Cardiac  Enzymes: Recent Labs  Lab 04/07/20 0915 04/10/20 0331 04/12/20 0504  CKTOTAL 3,471* 970* 469*  CKMB  --  3.2  --     HbA1C: Hgb A1c MFr Bld  Date/Time Value Ref Range Status  04/07/2020 09:15 AM 11.9 (H) 4.8 - 5.6 % Final    Comment:    (NOTE) Pre diabetes:          5.7%-6.4%  Diabetes:              >6.4%  Glycemic control for   <7.0% adults with diabetes   10/24/2017 03:57 AM 6.4 (H) 4.8 - 5.6 % Final    Comment:    (NOTE) Pre diabetes:          5.7%-6.4% Diabetes:              >6.4% Glycemic control for   <7.0% adults with diabetes     CBG: Recent Labs  Lab 04/11/20 1529 04/11/20 2050 04/12/20 0037 04/12/20 0444 04/12/20 0727  GLUCAP 185* 182* 200* 176* 168*    Signature:   Delfin Gant, NP-C [facility] Contact / Pager information can be found on Amion  04/12/2020, 7:58 AM

## 2020-04-12 NOTE — Consult Note (Signed)
WOC Nurse Consult Note: Patient receiving care in [facility] 2H05 Consult completed remotely after review of chart  Reason for Consult: Fissure between buttocks Wound type: Fissure intergluteal fold Pressure Injury POA: NA Measurement: Bedside RN to place in flowsheet Dressing procedure/placement/frequency:  Place folded piece of Xeroform in fissure of the intergluteal fold and secure with upside down sacral foam. Change daily or PRN soiling.  Monitor the wound area(s) for worsening of condition such as: Signs/symptoms of infection, increase in size, development of or worsening of odor, development of pain, or increased pain at the affected locations.   Notify the medical team if any of these develop.  Thank you for the consult. WOC nurse will not follow at this time.   Please re-consult the WOC team if needed.  Renaldo Reel Katrinka Blazing, MSN, RN, CMSRN, Angus Seller, [facility] Wound Treatment Associate Pager [phone]

## 2020-04-12 NOTE — Progress Notes (Signed)
Patient is very weak and more than 2 person assist for standing when transferred from wheel chair to bed. Arm sling removed by EP team. Dressing in place on Lt. CW until patient staying in the hospital. Patient was trying to get out of bed once, put on the bed alarm. Reminded patient to ask the help. HS [employer]

## 2020-04-12 NOTE — Progress Notes (Signed)
Patient transferred to [facility] bed 13 via wheelchair by SWOT nurse, handover report given

## 2020-04-12 NOTE — Evaluation (Signed)
Clinical/Bedside Swallow Evaluation Patient Details  Name: Jordan [name] MRN: [id number] Date of Birth: 02-02-1946  Today's Date: 04/12/2020 Time: SLP Start Time (ACUTE ONLY): 0944 SLP Stop Time (ACUTE ONLY): 0959 SLP Time Calculation (min) (ACUTE ONLY): 15 min  Past Medical History:  Past Medical History:  Diagnosis Date  . BPH (benign prostatic hyperplasia)   . CVA (cerebral vascular accident) (HCC) 10/23/2017   CT confirms subacute R parietal infarct  . GERD (gastroesophageal reflux disease)   . High cholesterol   . Hypertension    Past Surgical History:  Past Surgical History:  Procedure Laterality Date  . LEFT HEART CATH AND CORONARY ANGIOGRAPHY N/A 01/28/2020   Procedure: LEFT HEART CATH AND CORONARY ANGIOGRAPHY;  Surgeon: Laurier Nancy, MD;  Location: [facility] INVASIVE CV LAB;  Service: Cardiovascular;  Laterality: N/A;  . LOOP RECORDER INSERTION N/A 10/27/2017   Procedure: LOOP RECORDER INSERTION;  Surgeon: Marinus Maw, MD;  Location: [facility] INVASIVE CV LAB;  Service: Cardiovascular;  Laterality: N/A;  . NO PAST SURGERIES    . PACEMAKER IMPLANT N/A 04/10/2020   Procedure: PACEMAKER IMPLANT;  Surgeon: Lanier Prude, MD;  Location: [facility] INVASIVE CV LAB;  Service: Cardiovascular;  Laterality: N/A;  . TEE WITHOUT CARDIOVERSION N/A 10/27/2017   Procedure: TRANSESOPHAGEAL ECHOCARDIOGRAM (TEE);  Surgeon: Pricilla Riffle, MD;  Location: [facility] ENDOSCOPY;  Service: Cardiovascular;  Laterality: N/A;  . TEMPORARY PACEMAKER N/A 04/07/2020   Procedure: TEMPORARY PACEMAKER;  Surgeon: Swaz[place], Peter M, MD;  Location: [facility] INVASIVE CV LAB;  Service: Cardiovascular;  Laterality: N/A;   HPI:  Pt is a 74 yo male found down at home and found to be in complete heart block s/p pacemaker placement 12/20. Pt also in HHS with new onset diabetes. ETT 12/17-12/21. PMH includes: GERD, DM, HTN, CKD, CAD   Assessment / Plan / Recommendation Clinical Impression  Pt does not have any overt s/s of aspiration, but  given his altered mentation, recent intubation, and limited dentition, would start with Dys 2 diet and thin liquids. Would also provide medications whole but in puree. His oral preparation overall is slow, but effective, and his vocal quality while soft is improved from previous date per MD. SLP will f/u briefly for tolerance and potential to advance. SLP Visit Diagnosis: Dysphagia, unspecified (R13.10)    Aspiration Risk  Mild aspiration risk    Diet Recommendation Dysphagia 2 (Fine chop);Thin liquid   Liquid Administration via: Cup;Straw Medication Administration: Whole meds with puree Supervision: Patient able to self feed;Intermittent supervision to cue for compensatory strategies;Comment (set up assist) Compensations: Minimize environmental distractions;Slow rate;Small sips/bites Postural Changes: Seated upright at 90 degrees    Other  Recommendations Oral Care Recommendations: Oral care BID   Follow up Recommendations  (tba)      Frequency and Duration min 2x/week  2 weeks       Prognosis Prognosis for Safe Diet Advancement: Good Barriers to Reach Goals: Cognitive deficits      Swallow Study   General HPI: Pt is a 74 yo male found down at home and found to be in complete heart block s/p pacemaker placement 12/20. Pt also in HHS with new onset diabetes. ETT 12/17-12/21. PMH includes: GERD, DM, HTN, CKD, CAD Type of Study: Bedside Swallow Evaluation Previous Swallow Assessment: none in chart Diet Prior to this Study: NPO Temperature Spikes Noted: No Respiratory Status: Nasal cannula History of Recent Intubation: Yes Length of Intubations (days): 4 days Date extubated: 04/11/20 Behavior/Cognition: Alert;Cooperative;Requires cueing Oral Cavity Assessment: Excessive  secretions Oral Care Completed by SLP: No Oral Cavity - Dentition: Poor condition;Missing dentition Vision: Functional for self-feeding Self-Feeding Abilities: Able to feed self Patient Positioning: Upright  in bed Baseline Vocal Quality: Low vocal intensity Volitional Cough: Weak Volitional Swallow: Able to elicit    Oral/Motor/Sensory Function Overall Oral Motor/Sensory Function: Generalized oral weakness   Ice Chips Ice chips: Within functional limits Presentation: Spoon   Thin Liquid Thin Liquid: Within functional limits Presentation: Cup;Self Fed;Straw    Nectar Thick Nectar Thick Liquid: Not tested   Honey Thick Honey Thick Liquid: Not tested   Puree Puree: Impaired Presentation: Spoon Oral Phase Functional Implications: Prolonged oral transit   Solid     Solid: Impaired Presentation: Spoon Oral Phase Functional Implications: Prolonged oral transit;Impaired mastication      Mahala Menghini., M.A. CCC-SLP Acute Rehabilitation Services Pager [phone] Office [phone]  04/12/2020,10:07 AM

## 2020-04-13 LAB — GLUCOSE, CAPILLARY
Glucose-Capillary: 206 mg/dL — ABNORMAL HIGH (ref 70–99)
Glucose-Capillary: 204 mg/dL — ABNORMAL HIGH (ref 70–99)
Glucose-Capillary: 319 mg/dL — ABNORMAL HIGH (ref 70–99)
Glucose-Capillary: 153 mg/dL — ABNORMAL HIGH (ref 70–99)

## 2020-04-13 MED ORDER — HEPARIN SODIUM (PORCINE) 5000 UNIT/ML IJ SOLN
5000.0000 [IU] | Freq: Three times a day (TID) | INTRAMUSCULAR | Status: DC
  Administered 2020-04-13 – 2020-04-19 (×17): 5000 [IU] via SUBCUTANEOUS
  Filled 2020-04-13 (×16): qty 1

## 2020-04-13 MED ORDER — GLUCERNA SHAKE PO LIQD
237.0000 mL | Freq: Three times a day (TID) | ORAL | Status: DC
  Administered 2020-04-13 – 2020-04-19 (×14): 237 mL via ORAL

## 2020-04-13 MED ORDER — PROSOURCE PLUS PO LIQD
30.0000 mL | Freq: Two times a day (BID) | ORAL | Status: DC
  Administered 2020-04-15 – 2020-04-16 (×4): 30 mL via ORAL
  Filled 2020-04-13 (×6): qty 30

## 2020-04-13 NOTE — Evaluation (Signed)
Occupational Therapy Evaluation Patient Details Name: Jordan [name] MRN: [id number] DOB: [birth year] Today's Date: 04/13/2020    History of Present Illness Pt is a 74 year old man admitted on 04/07/20 after being found down with AMS and complete heart block. Intubated 12/17-12/21. Brain MRI negative for acute change.  Pacemaker 04/10/20.PMH: HTN, CKD, CAD, DM, CVA.   Clinical Impression   Pt was living independently prior to admission. Presents with impaired cognition having just removed his IV access, pulse ox and condom cath with difficulty following commands and poor task persistence. He demonstrates a strong posterior lean in sitting and requires +2 mod to total assist for mobility and min to total assist for ADL. Pt will need post acute rehab in SNF upon discharge. Will follow acutely.     Follow Up Recommendations  SNF;Supervision/Assistance - 24 hour    Equipment Recommendations  3 in 1 bedside commode    Recommendations for Other Services       Precautions / Restrictions Precautions Precautions: Fall;ICD/Pacemaker      Mobility Bed Mobility Overal bed mobility: Needs Assistance Bed Mobility: Supine to Sit     Supine to sit: +2 for physical assistance;Total assist     General bed mobility comments: no initiation, assist for all aspects, strong posterior lean    Transfers Overall transfer level: Needs assistance Equipment used: 2 person hand held assist Transfers: Sit to/from Stand;Stand Pivot Transfers Sit to Stand: +2 physical assistance;Mod assist Stand pivot transfers: +2 physical assistance;Mod assist       General transfer comment: assist to rise and steady as pt took pivotal steps to chair    Balance Overall balance assessment: Needs assistance Sitting-balance support: Feet supported;Bilateral upper extremity supported Sitting balance-Leahy Scale: Zero Sitting balance - Comments: strong posterior lean     Standing balance-Leahy Scale: Poor                              ADL either performed or assessed with clinical judgement   ADL Overall ADL's : Needs assistance/impaired Eating/Feeding: Set up;Sitting   Grooming: Wash/dry hands;Sitting;Minimal assistance Grooming Details (indicate cue type and reason): decreased thoroughness Upper Body Bathing: Total assistance;Sitting   Lower Body Bathing: +2 for physical assistance;Total assistance;Sit to/from stand   Upper Body Dressing : Maximal assistance;Sitting   Lower Body Dressing: Total assistance;Sitting/lateral leans;+2 for physical assistance   Toilet Transfer: +2 for physical assistance;Moderate assistance;Stand-pivot   Toileting- Clothing Manipulation and Hygiene: Total assistance;+2 for physical assistance;Sit to/from stand         General ADL Comments: pt with zero sitting balance interfering with ability to participate in seated ADL     Vision   Additional Comments: vision appears intact     Perception     Praxis      Pertinent Vitals/Pain Pain Assessment: Faces Faces Pain Scale: No hurt     Hand Dominance Right   Extremity/Trunk Assessment Upper Extremity Assessment Upper Extremity Assessment: Generalized weakness;LUE deficits/detail LUE Deficits / Details: limited use due to pacemaker, sling discontinued LUE: Unable to fully assess due to immobilization LUE Coordination: decreased gross motor   Lower Extremity Assessment Lower Extremity Assessment: Defer to PT evaluation   Cervical / Trunk Assessment Cervical / Trunk Assessment: Other exceptions (weakness)   Communication Communication Communication: No difficulties   Cognition Arousal/Alertness: Awake/alert Behavior During Therapy: Flat affect Overall Cognitive Status: Impaired/Different from baseline Area of Impairment: Attention;Memory;Following commands;Safety/judgement;Awareness;Problem solving  Current Attention Level: Sustained Memory: Decreased  short-term memory (cued himself to avoid use of L UE) Following Commands: Follows one step commands inconsistently;Follows one step commands with increased time Safety/Judgement: Decreased awareness of safety;Decreased awareness of deficits Awareness: Emergent Problem Solving: Slow processing;Decreased initiation;Difficulty sequencing;Requires verbal cues;Requires tactile cues General Comments: pt aware his sister found him down, that he was at the hospital and had a pacemaker implanted, he knows Christmas is in 2 days.   General Comments       Exercises     Shoulder Instructions      Home Living Family/patient expects to be discharged to:: Private residence Living Arrangements: Alone Available Help at Discharge: Family;Available PRN/intermittently Type of Home: Apartment Home Access: Level entry     Home Layout: One level     Bathroom Shower/Tub: Chief Strategy Officer: Standard     Home Equipment: None          Prior Functioning/Environment Level of Independence: Independent        Comments: drives, retired        Pharmacist, community Problem List: Decreased strength;Decreased activity tolerance;Impaired balance (sitting and/or standing);Decreased cognition;Decreased coordination;Decreased safety awareness;Decreased knowledge of use of DME or AE;Impaired UE functional use      OT Treatment/Interventions: Self-care/ADL training;DME and/or AE instruction;Therapeutic activities;Cognitive remediation/compensation;Patient/family education;Balance training    OT Goals(Current goals can be found in the care plan section) Acute Rehab OT Goals Patient Stated Goal: return home OT Goal Formulation: With patient Time For Goal Achievement: 04/27/20 Potential to Achieve Goals: Fair ADL Goals Pt Will Perform Eating: sitting;with supervision Pt Will Perform Grooming: with supervision;sitting Pt Will Perform Upper Body Dressing: with min assist;sitting Pt Will Transfer to Toilet:  with mod assist;ambulating;bedside commode Additional ADL Goal #1: Pt will demonstrate fair sitting balance as a precursor to ADL. Additional ADL Goal #2: Pt will follow one step commands with 50% accuracy.  OT Frequency: Min 2X/week   Barriers to D/C: Decreased caregiver support          Co-evaluation PT/OT/SLP Co-Evaluation/Treatment: Yes Reason for Co-Treatment: For patient/therapist safety   OT goals addressed during session: ADL's and self-care;Proper use of Adaptive equipment and DME      AM-PAC OT "6 Clicks" Daily Activity     Outcome Measure Help from another person eating meals?: A Little Help from another person taking care of personal grooming?: A Lot Help from another person toileting, which includes using toliet, bedpan, or urinal?: Total Help from another person bathing (including washing, rinsing, drying)?: A Lot Help from another person to put on and taking off regular upper body clothing?: A Lot Help from another person to put on and taking off regular lower body clothing?: Total 6 Click Score: 11   End of Session Equipment Utilized During Treatment: Gait belt Nurse Communication: Mobility status  Activity Tolerance: Patient tolerated treatment well Patient left: in chair;with call bell/phone within reach;with chair alarm set  OT Visit Diagnosis: Unsteadiness on feet (R26.81);Other abnormalities of gait and mobility (R26.89);Muscle weakness (generalized) (M62.81);Other symptoms and signs involving cognitive function                Time: 1144-1159 OT Time Calculation (min): 15 min Charges:  OT General Charges $OT Visit: 1 Visit OT Evaluation $OT Eval Moderate Complexity: 1 Mod  Jordan [name], OTR/L Acute Rehabilitation Services Pager: [phone] Office: [phone] Jordan [name] 04/13/2020, 1:33 PM

## 2020-04-13 NOTE — Progress Notes (Signed)
PROGRESS NOTE    Jordan [name]  XMI:[id number] DOB: 1945-06-03 DOA: 04/07/2020 PCP: Sherrie Mustache, MD    Brief Narrative:  Jordan [name] is a 74 yo M w/ PMH of Dm, HTN, CKD, CAD presenting to [facility] after being found down. Found to be altered. Intubated for altered mental status. He was brought in as code stroke due to left neglect but was found to be bradycardic with HR in 30s. He was given atropine, cardiology was consulted. He was emergently intubated. He was also found to be in HHS with cbg >500.He was treated with insulin drip, transitioned off drip on 12/19. Extubated on 12/21/ 21.  MRI brain negative for acute CVA, had past CVA.  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  Transferred to [facility] on 12/23.    Assessment & Plan:   Active Problems:   Hyperglycemic crisis due to diabetes mellitus (HCC)   Heart block AV complete (HCC)   New onset type 2 diabetes mellitus (HCC)   AKI (acute kidney injury) (HCC)   Acute on chronic respiratory failure with hypoxia (HCC)   Pressure injury of skin   HHC with new diagnosis of DM2 and with dysphagia.  HgbA1c of 11.9% Seen by SLP, on dysphagia diet.  Continue SSI.  Patient with need for prompting to eat.   Complete Heart block.  S/p PPM placement on 12/20 with wound check today as stable.   AKI on CKD stage 3.  Improving. Scr peaked at 3.06.   CAD No chest pain. Cath in 01/2020 with non-obstructive cad. Continue statin and ASA.   Pressure ulcers of bl heels, poa.  Continue wound care, pressure relieving services  Physical debility Seen by PT and OT. Needs SNF placement. CM following.   Hx of CVA Continue statin and ASA.  PT, OT, and SLP   Non-severe (moderate) malnutrition Poa. Dietitian consulted, patient to start nutritional supplements.    DVT prophylaxis: start heparin Sq Code Status: Full Family Communication: None today Disposition Plan: To SNF. Medically ready for dc as of today. CM consulted. Patient from home.     Consultants:   Cardiology  Procedures:  PPM 04/10/20   Antimicrobials:  none  Subjective: Patient reports feeling tired but offers no complaints. Per nursing, patient tired this afternoon but later was able to work with PT and OT.   Objective: Vitals:   04/13/20 0309 04/13/20 0800 04/13/20 1300 04/13/20 1609  BP: 134/62 111/79 136/60 (!) 149/62  Pulse: 73 78 77 77  Resp: 19 20 18 18   Temp: 98.2 F (36.8 C) 98.7 F (37.1 C) 98.7 F (37.1 C)   TempSrc: Oral Oral Oral   SpO2: 97% 98% 98% 98%  Weight:      Height:        Intake/Output Summary (Last 24 hours) at 04/13/2020 1755 Last data filed at 04/13/2020 1700 Gross per 24 hour  Intake 826.71 ml  Output 300 ml  Net 526.71 ml   Filed Weights   04/08/20 0300 04/09/20 0635 04/10/20 0510  Weight: 74.8 kg 74.8 kg 79.8 kg    Examination:  General exam: Appears calm and comfortable  Respiratory system: Clear to auscultation. Respiratory effort normal. Cardiovascular system: S1 & S2  present RRR.  Gastrointestinal system: Abdomen is nondistended, soft and nontender. Central nervous system: Alert and oriented to self only. Slow to respond, able to move ext x4. No dysarthria noted.  Extremities: No edema or cyanosis. Skin: Bilateral heel wounds Psychiatry:  Mood & affect appropriate.  Data Reviewed: I have personally reviewed following labs and imaging studies  CBC: Recent Labs  Lab 04/07/20 0915 04/07/20 0922 04/07/20 1907 04/07/20 2336 04/08/20 0259 04/09/20 0250 04/10/20 0331 04/11/20 0357 04/12/20 0504  WBC 17.7*  --  16.1*  --   --  12.4* 13.8* 13.3* 11.3*  NEUTROABS 14.3*  --   --   --   --   --   --   --   --   HGB 14.7   < > 13.2   < > 12.2* 11.4* 10.7* 10.2* 10.8*  HCT 47.3   < > 41.1   < > 36.0* 36.8* 34.3* 33.2* 34.1*  MCV 92.9  --  89.7  --   --  93.9 93.7 94.6 93.2  PLT 263  --  235  --   --  143* 141* 144* 183   < > = values in this interval not displayed.   Basic Metabolic  Panel: Recent Labs  Lab 04/08/20 0439 04/08/20 1113 04/08/20 2102 04/09/20 0250 04/10/20 0331 04/11/20 0357 04/12/20 0504  NA 145   < > 145 144 146* 142 148*  K 3.7   < > 3.7 4.3 3.5 3.8 3.7  CL 109   < > 109 112* 110 109 115*  CO2 27   < > 27 25 27 22 22   GLUCOSE 232*   < > 267* 199* 111* 224* 197*  BUN 36*   < > 27* 25* 23 24* 30*  CREATININE 2.03*   < > 1.70* 1.62* 1.84* 1.80* 1.85*  CALCIUM 9.0   < > 8.7* 8.5* 8.4* 8.1* 8.6*  MG 2.6*  --   --   --  2.5*  --  2.7*  PHOS 1.9*  --   --   --   --   --   --    < > = values in this interval not displayed.   GFR: Estimated Creatinine Clearance: 37.3 mL/min (A) (by C-G formula based on SCr of 1.85 mg/dL (H)). Liver Function Tests: Recent Labs  Lab 04/07/20 0915 04/12/20 0504  AST 39 47*  ALT 34 34  ALKPHOS 59 63  BILITOT 1.2 0.8  PROT 7.3 5.3*  ALBUMIN 3.3* 1.6*   No results for input(s): LIPASE, AMYLASE in the last 168 hours. Recent Labs  Lab 04/07/20 1907  AMMONIA 28   Coagulation Profile: Recent Labs  Lab 04/07/20 0947 04/10/20 0331  INR 1.2 1.3*   Cardiac Enzymes: Recent Labs  Lab 04/07/20 0915 04/10/20 0331 04/12/20 0504  CKTOTAL 3,471* 970* 469*  CKMB  --  3.2  --    BNP (last 3 results) No results for input(s): PROBNP in the last 8760 hours. HbA1C: No results for input(s): HGBA1C in the last 72 hours. CBG: Recent Labs  Lab 04/12/20 1550 04/12/20 2150 04/13/20 0628 04/13/20 1149 04/13/20 1603  GLUCAP 262* 276* 206* 204* 319*   Lipid Profile: No results for input(s): CHOL, HDL, LDLCALC, TRIG, CHOLHDL, LDLDIRECT in the last 72 hours. Thyroid Function Tests: No results for input(s): TSH, T4TOTAL, FREET4, T3FREE, THYROIDAB in the last 72 hours. Anemia Panel: No results for input(s): VITAMINB12, FOLATE, FERRITIN, TIBC, IRON, RETICCTPCT in the last 72 hours. Sepsis Labs: No results for input(s): PROCALCITON, LATICACIDVEN in the last 168 hours.  Recent Results (from the past 240 hour(s))   Resp Panel by RT-PCR (Flu A&B, Covid) Nasopharyngeal Swab     Status: None   Collection Time: 04/07/20  9:07 AM   Specimen: Nasopharyngeal Swab;  Nasopharyngeal(NP) swabs in vial transport medium  Result Value Ref Range Status   SARS Coronavirus 2 by RT PCR NEGATIVE NEGATIVE Final    Comment: (NOTE) SARS-CoV-2 target nucleic acids are NOT DETECTED.  The SARS-CoV-2 RNA is generally detectable in upper respiratory specimens during the acute phase of infection. The lowest concentration of SARS-CoV-2 viral copies this assay can detect is 138 copies/mL. A negative result does not preclude SARS-Cov-2 infection and should not be used as the sole basis for treatment or other patient management decisions. A negative result may occur with  improper specimen collection/handling, submission of specimen other than nasopharyngeal swab, presence of viral mutation(s) within the areas targeted by this assay, and inadequate number of viral copies(<138 copies/mL). A negative result must be combined with clinical observations, patient history, and epidemiological information. The expected result is Negative.  Fact Sheet for Patients:  BloggerCourse.com  Fact Sheet for Healthcare Providers:  SeriousBroker.it  This test is no t yet approved or cleared by the [place] FDA and  has been authorized for detection and/or diagnosis of SARS-CoV-2 by FDA under an Emergency Use Authorization (EUA). This EUA will remain  in effect (meaning this test can be used) for the duration of the COVID-19 declaration under Section 564(b)(1) of the Act, 21 U.S.C.section 360bbb-3(b)(1), unless the authorization is terminated  or revoked sooner.       Influenza A by PCR NEGATIVE NEGATIVE Final   Influenza B by PCR NEGATIVE NEGATIVE Final    Comment: (NOTE) The Xpert Xpress SARS-CoV-2/FLU/RSV plus assay is intended as an aid in the diagnosis of influenza from  Nasopharyngeal swab specimens and should not be used as a sole basis for treatment. Nasal washings and aspirates are unacceptable for Xpert Xpress SARS-CoV-2/FLU/RSV testing.  Fact Sheet for Patients: BloggerCourse.com  Fact Sheet for Healthcare Providers: SeriousBroker.it  This test is not yet approved or cleared by the [place] FDA and has been authorized for detection and/or diagnosis of SARS-CoV-2 by FDA under an Emergency Use Authorization (EUA). This EUA will remain in effect (meaning this test can be used) for the duration of the COVID-19 declaration under Section 564(b)(1) of the Act, 21 U.S.C. section 360bbb-3(b)(1), unless the authorization is terminated or revoked.  Performed at [facility], [address], [place], [place] [address]   MRSA PCR Screening     Status: Abnormal   Collection Time: 04/07/20 10:37 PM   Specimen: Nasopharyngeal  Result Value Ref Range Status   MRSA by PCR POSITIVE (A) NEGATIVE Final    Comment:        The GeneXpert MRSA Assay (FDA approved for NASAL specimens only), is one component of a comprehensive MRSA colonization surveillance program. It is not intended to diagnose MRSA infection nor to guide or monitor treatment for MRSA infections. RESULT CALLED TO, READ BACK BY AND VERIFIED WITH: B NGUYEN RN 04/08/20 9563 JDW Performed at [facility], [address], [place], [place] [address]          Radiology Studies: DG CHEST PORT 1 VIEW  Result Date: 04/12/2020 CLINICAL DATA:  Heart block EXAM: PORTABLE CHEST 1 VIEW COMPARISON:  04/11/2020 FINDINGS: Cardiac shadow is stable. Endotracheal tube and gastric catheter have been removed in the interval. Pacing device is again seen and stable. Right jugular sheath is noted in satisfactory position. Lungs are well aerated with the exception of minimal left basilar atelectasis. Loop recorder is again seen. IMPRESSION:  Minimal left basilar atelectasis. No other focal abnormality  is noted. Electronically Signed   By: Alcide CleverMark  Lukens M.D.   On: 04/12/2020 08:09        Scheduled Meds: . [START ON 04/14/2020] (feeding supplement) PROSource Plus  30 mL Oral BID BM  . aspirin  81 mg Oral Daily  . Chlorhexidine Gluconate Cloth  6 each Topical Daily  . docusate  100 mg Oral BID  . feeding supplement (GLUCERNA SHAKE)  237 mL Oral TID BM  . insulin aspart  0-9 Units Subcutaneous TID WC  . mouth rinse  15 mL Mouth Rinse BID  . pantoprazole  40 mg Oral Daily  . polyethylene glycol  17 g Oral Daily  . rosuvastatin  20 mg Oral Daily  . sodium chloride flush  10-40 mL Intracatheter Q12H   Continuous Infusions: . dextrose 5 % and 0.9% NaCl 10 mL/hr at 04/12/20 0000     LOS: 6 days    Time spent: 35 minutes    Ky BarbanSolianny D Aalivia Mcgraw, MD [facility]   If 7PM-7AM, please contact night-coverage [email or web address] Password [email or web address] 04/13/2020, 5:55 PM

## 2020-04-13 NOTE — Evaluation (Signed)
Physical Therapy Evaluation Patient Details Name: Jordan [name] MRN: [id number] DOB: 1945/05/03 Today's Date: 04/13/2020   History of Present Illness  Pt is a 74 year old man admitted on 04/07/20 after being found down with AMS and complete heart block. Intubated 12/17-12/21. Brain MRI negative for acute changes. Pacemaker 04/10/20.PMH: HTN, CKD, CAD, DM, CVA.  Clinical Impression  Pt admitted with above diagnosis. Pt with impaired cognition as well as impaired mobility. Pt needed mod to total assist for all aspects of care.  Pt with poor sitting and standing balance. Will need SNF prior to d/c home. Pt currently with functional limitations due to the deficits listed below (see PT Problem List). Pt will benefit from skilled PT to increase their independence and safety with mobility to allow discharge to the venue listed below.      Follow Up Recommendations SNF;Supervision/Assistance - 24 hour    Equipment Recommendations  Other (comment) (TBA)    Recommendations for Other Services       Precautions / Restrictions Precautions Precautions: Fall;ICD/Pacemaker Restrictions Weight Bearing Restrictions: No      Mobility  Bed Mobility Overal bed mobility: Needs Assistance Bed Mobility: Supine to Sit     Supine to sit: +2 for physical assistance;Total assist     General bed mobility comments: no initiation, assist for all aspects, strong posterior lean    Transfers Overall transfer level: Needs assistance Equipment used: 2 person hand held assist Transfers: Sit to/from Stand;Stand Pivot Transfers Sit to Stand: +2 physical assistance;Mod assist Stand pivot transfers: +2 physical assistance;Mod assist       General transfer comment: assist to rise and steady as pt took pivotal steps to chair  Ambulation/Gait                Stairs            Wheelchair Mobility    Modified Rankin (Stroke Patients Only)       Balance Overall balance assessment: Needs  assistance Sitting-balance support: Feet supported;Bilateral upper extremity supported Sitting balance-Leahy Scale: Zero Sitting balance - Comments: strong posterior lean   Standing balance support: Bilateral upper extremity supported;During functional activity Standing balance-Leahy Scale: Poor Standing balance comment: Pt with heavy posterior lean and definite need of 2 person mod assist                             Pertinent Vitals/Pain Pain Assessment: Faces Faces Pain Scale: No hurt    Home Living Family/patient expects to be discharged to:: Private residence Living Arrangements: Alone Available Help at Discharge: Family;Available PRN/intermittently Type of Home: Apartment Home Access: Level entry     Home Layout: One level Home Equipment: None      Prior Function Level of Independence: Independent         Comments: drives, retired     Higher education careers adviser   Dominant Hand: Right    Extremity/Trunk Assessment   Upper Extremity Assessment Upper Extremity Assessment: Defer to OT evaluation LUE Deficits / Details: limited use due to pacemaker, sling discontinued LUE: Unable to fully assess due to immobilization LUE Coordination: decreased gross motor    Lower Extremity Assessment Lower Extremity Assessment: RLE deficits/detail;LLE deficits/detail RLE Deficits / Details: grossly 2/5 LLE Deficits / Details: grossly 2/5    Cervical / Trunk Assessment Cervical / Trunk Assessment: Other exceptions (weakness)  Communication   Communication: No difficulties  Cognition Arousal/Alertness: Awake/alert Behavior During Therapy: Flat affect Overall Cognitive Status:  Impaired/Different from baseline Area of Impairment: Attention;Memory;Following commands;Safety/judgement;Awareness;Problem solving                   Current Attention Level: Sustained Memory: Decreased short-term memory (cued himself to avoid use of L UE) Following Commands: Follows one  step commands inconsistently;Follows one step commands with increased time Safety/Judgement: Decreased awareness of safety;Decreased awareness of deficits Awareness: Emergent Problem Solving: Slow processing;Decreased initiation;Difficulty sequencing;Requires verbal cues;Requires tactile cues General Comments: pt aware his sister found him down, that he was at the hospital and had a pacemaker implanted, he knows Christmas is in 2 days.      General Comments      Exercises     Assessment/Plan    PT Assessment Patient needs continued PT services  PT Problem List Decreased activity tolerance;Decreased balance;Decreased mobility;Decreased coordination;Decreased cognition;Decreased knowledge of use of DME;Decreased safety awareness;Decreased knowledge of precautions       PT Treatment Interventions DME instruction;Gait training;Functional mobility training;Therapeutic activities;Therapeutic exercise;Balance training;Patient/family education    PT Goals (Current goals can be found in the Care Plan section)  Acute Rehab PT Goals Patient Stated Goal: return home PT Goal Formulation: With patient Time For Goal Achievement: 04/27/20 Potential to Achieve Goals: Good    Frequency Min 2X/week   Barriers to discharge Decreased caregiver support      Co-evaluation PT/OT/SLP Co-Evaluation/Treatment: Yes Reason for Co-Treatment: Complexity of the patient's impairments (multi-system involvement);For patient/therapist safety PT goals addressed during session: Mobility/safety with mobility OT goals addressed during session: ADL's and self-care;Proper use of Adaptive equipment and DME       AM-PAC PT "6 Clicks" Mobility  Outcome Measure Help needed turning from your back to your side while in a flat bed without using bedrails?: Total Help needed moving from lying on your back to sitting on the side of a flat bed without using bedrails?: Total Help needed moving to and from a bed to a chair  (including a wheelchair)?: Total Help needed standing up from a chair using your arms (e.g., wheelchair or bedside chair)?: Total Help needed to walk in hospital room?: Total Help needed climbing 3-5 steps with a railing? : Total 6 Click Score: 6    End of Session Equipment Utilized During Treatment: Gait belt Activity Tolerance: Patient limited by fatigue Patient left: in chair;with call bell/phone within reach;with chair alarm set Nurse Communication: Mobility status PT Visit Diagnosis: Unsteadiness on feet (R26.81)    Time: 2993-7169 PT Time Calculation (min) (ACUTE ONLY): 15 min   Charges:   PT Evaluation $PT Eval Moderate Complexity: 1 Mod          Mayci Haning W,PT Acute Rehabilitation Services Pager:  [phone]  Office:  [phone]    Berline Lopes 04/13/2020, 2:33 PM

## 2020-04-13 NOTE — Progress Notes (Addendum)
Inpatient Diabetes Program Recommendations  AACE/ADA: New Consensus Statement on Inpatient Glycemic Control (2015)  Target Ranges:  Prepandial:   less than 140 mg/dL      Peak postprandial:   less than 180 mg/dL (1-2 hours)      Critically ill patients:  140 - 180 mg/dL   Lab Results  Component Value Date   GLUCAP 206 (H) 04/13/2020   HGBA1C 11.9 (H) 04/07/2020    Review of Glycemic Control Results for SANDOR, ARBOLEDA (MRN [id number]) as of 04/13/2020 10:22  Ref. Range 04/12/2020 07:27 04/12/2020 11:06 04/12/2020 15:50 04/12/2020 21:50 04/13/2020 06:28  Glucose-Capillary Latest Ref Range: 70 - 99 mg/dL 919 (H) 166 (H) 060 (H) 276 (H) 206 (H)   Diabetes history:  New DM Current orders for Inpatient glycemic control:  Novolog 0-9 units TID  Inpatient Diabetes Program Recommendations:     CBG's elevated now that he is eating.  Please consider Lantus 10 units daily.  Will speak to patient today about new diagnosis of DM2.  Addendum @ 1233: Patient is confused and not appropriate for diabetes education at this time.  Will continue to follow while inpatient.  Thank you, Dulce Sellar, RN, BSN Diabetes Coordinator Inpatient Diabetes Program [phone] (team pager from 8a-5p)

## 2020-04-13 NOTE — Progress Notes (Signed)
Initial Nutrition Assessment  DOCUMENTATION CODES:   Non-severe (moderate) malnutrition in context of social or environmental circumstances  INTERVENTION:   - RD to follow for appropriateness of diabetes diet education  - Glucerna Shake po TID, each supplement provides 220 kcal and 10 grams of protein  - ProSource Plus 30 ml po BID, each supplement provides 100 kcal and 15 grams of protein  NUTRITION DIAGNOSIS:   Moderate Malnutrition related to social / environmental circumstances as evidenced by moderate fat depletion,moderate muscle depletion.  GOAL:   Patient will meet greater than or equal to 90% of their needs  MONITOR:   PO intake,Supplement acceptance,Labs,Weight trends,Skin  REASON FOR ASSESSMENT:   Consult Assessment of nutrition requirement/status  ASSESSMENT:   74 year old male who presented to the ED on 12/17 with left-sided weakness and right gaze preference. PMH of stroke, HTN, HLD, BPH, CKD, nonobstructive CAD. Pt admitted with HHS and found to have complete heart block. MRI negative for stroke.   12/17 - intubated, s/p temporary pacemaker 12/20 - s/p PPM 12/21 - extubated 12/22 - diet advanced to dysphagia 2 with thin liquids  Pt with new onset diabetes and hemoglobin A1C of 11.9.  Noted therapies recommending SNF. Per chart review, pt lives alone and family is concerned about this.  Spoke with pt briefly at bedside. Pt somewhat confused and not appropriate for diet education at this time. Pt was not able to remember working with therapies (OT and PT) earlier in the day. Suspect pt will not remember diet education if provided at this time. RD will follow for appropriateness of diabetes diet education on follow-up.  Pt able to provide limited diet and weight history. Pt reports that he eats 3 meals daily. He states that his weight "goes up and down" and that he usually weighs between 160-170 lbs. Pt states that he ate well at lunch but can't recall what  he had to eat.  Pt willing to try oral nutrition supplements to aid in meeting kcal and protein needs. RD to order Glucerna Shake and ProSource Plus at this time. If PO intake does not improve, will plan to transition to Ensure to provide additional kcal and protein.  Reviewed weight history in chart. Weight appears stable over the past 3 months.  Meal Completion: 30% x 2 meals  Medications reviewed and include: colace, SSI, protonix, miralax  Labs reviewed: sodium 148, BUN 30, creatinine 1.85, magnesium 2.7 CBG's: 204-276 x 24 hours  NUTRITION - FOCUSED PHYSICAL EXAM:  Flowsheet Row Most Recent Value  Orbital Region No depletion  Upper Arm Region Mild depletion  Thoracic and Lumbar Region Moderate depletion  Buccal Region Moderate depletion  Temple Region Mild depletion  Clavicle Bone Region Mild depletion  Clavicle and Acromion Bone Region Mild depletion  Scapular Bone Region Mild depletion  Dorsal Hand Moderate depletion  Patellar Region Moderate depletion  Anterior Thigh Region Moderate depletion  Posterior Calf Region Moderate depletion  Edema (RD Assessment) None  Hair Reviewed  Eyes Reviewed  Mouth Reviewed  Skin Reviewed  Nails Reviewed       Diet Order:   Diet Order            DIET DYS 2 Room service appropriate? Yes; Fluid consistency: Thin  Diet effective now                 EDUCATION NEEDS:   Not appropriate for education at this time  Skin:  Skin Assessment: Skin Integrity Issues: DTI: bilateral heels Stage I: left  hip  Last BM:  04/12/20 type 7  Height:   Ht Readings from Last 1 Encounters:  04/07/20 5\' 11"  (1.803 m)    Weight:   Wt Readings from Last 1 Encounters:  04/10/20 79.8 kg    BMI:  Body mass index is 24.54 kg/m.  Estimated Nutritional Needs:   Kcal:  2000-2200  Protein:  100-115 grams  Fluid:  >/= 2.0 L    04/12/20, MS, RD, LDN Inpatient Clinical Dietitian Please see [facility] for contact information.

## 2020-04-13 NOTE — Progress Notes (Signed)
  Speech Language Pathology Treatment: Dysphagia  Patient Details Name: Jordan [name] MRN: [id number] DOB: 1945-07-20 Today's Date: 04/13/2020 Time: 0786-7544 SLP Time Calculation (min) (ACUTE ONLY): 14 min  Assessment / Plan / Recommendation Clinical Impression  Pt is alert with stronger vocal quality today but also confused and asking repetitive questions. He needs minimal distractions in the room to attend to self-feeding. He only wanted the pureed foods on his meal tray but drank thin liquids as well. No overt s/s of aspiration are noted. Mod cues are provided throughout meal primarily for cognition. Will continue with Dys 2 diet and thin liquids for now.    HPI HPI: Pt is a 75 yo male found down at home and found to be in complete heart block s/p pacemaker placement 12/20. Pt also in HHS with new onset diabetes. ETT 12/17-12/21. PMH includes: GERD, DM, HTN, CKD, CAD      SLP Plan  Continue with current plan of care       Recommendations  Diet recommendations: Dysphagia 2 (fine chop);Thin liquid Liquids provided via: Cup;Straw Medication Administration: Whole meds with puree Supervision: Patient able to self feed;Intermittent supervision to cue for compensatory strategies (set-up assist) Compensations: Minimize environmental distractions;Slow rate;Small sips/bites Postural Changes and/or Swallow Maneuvers: Seated upright 90 degrees                Oral Care Recommendations: Oral care BID Follow up Recommendations: 24 hour supervision/assistance SLP Visit Diagnosis: Dysphagia, unspecified (R13.10) Plan: Continue with current plan of care       GO                Mahala Menghini., M.A. CCC-SLP Acute Rehabilitation Services Pager [phone] Office [phone]  04/13/2020, 9:06 AM

## 2020-04-13 NOTE — Progress Notes (Signed)
Pacemaker site check, tegaderm removed ster strips remain in place and intact Site is clean/dry No hematoma or bleeding Pt reminded of restrictions and wound care.  EP service will sign off though remain available, please recall if needed  Francis Dowse, PA-C

## 2020-04-14 DIAGNOSIS — E440 Moderate protein-calorie malnutrition: Secondary | ICD-10-CM | POA: Insufficient documentation

## 2020-04-14 LAB — GLUCOSE, CAPILLARY
Glucose-Capillary: 202 mg/dL — ABNORMAL HIGH (ref 70–99)
Glucose-Capillary: 190 mg/dL — ABNORMAL HIGH (ref 70–99)
Glucose-Capillary: 127 mg/dL — ABNORMAL HIGH (ref 70–99)
Glucose-Capillary: 197 mg/dL — ABNORMAL HIGH (ref 70–99)

## 2020-04-14 LAB — CBC
HCT: 36.6 % — ABNORMAL LOW (ref 39.0–52.0)
Hemoglobin: 11.3 g/dL — ABNORMAL LOW (ref 13.0–17.0)
MCH: 28.9 pg (ref 26.0–34.0)
MCHC: 30.9 g/dL (ref 30.0–36.0)
MCV: 93.6 fL (ref 80.0–100.0)
Platelets: 212 10*3/uL (ref 150–400)
RBC: 3.91 MIL/uL — ABNORMAL LOW (ref 4.22–5.81)
RDW: 13.6 % (ref 11.5–15.5)
WBC: 10.6 10*3/uL — ABNORMAL HIGH (ref 4.0–10.5)
nRBC: 0.2 % (ref 0.0–0.2)

## 2020-04-14 LAB — MAGNESIUM: Magnesium: 2.8 mg/dL — ABNORMAL HIGH (ref 1.7–2.4)

## 2020-04-14 LAB — BASIC METABOLIC PANEL
Anion gap: 9 (ref 5–15)
BUN: 24 mg/dL — ABNORMAL HIGH (ref 8–23)
CO2: 23 mmol/L (ref 22–32)
Calcium: 8.6 mg/dL — ABNORMAL LOW (ref 8.9–10.3)
Chloride: 118 mmol/L — ABNORMAL HIGH (ref 98–111)
Creatinine, Ser: 1.63 mg/dL — ABNORMAL HIGH (ref 0.61–1.24)
GFR, Estimated: 44 mL/min — ABNORMAL LOW (ref >60)
Glucose, Bld: 215 mg/dL — ABNORMAL HIGH (ref 70–99)
Potassium: 3.7 mmol/L (ref 3.5–5.1)
Sodium: 150 mmol/L — ABNORMAL HIGH (ref 135–145)

## 2020-04-14 MED ORDER — INSULIN GLARGINE 100 UNIT/ML ~~LOC~~ SOLN
10.0000 [IU] | Freq: Every day | SUBCUTANEOUS | Status: DC
  Administered 2020-04-14 – 2020-04-15 (×2): 10 [IU] via SUBCUTANEOUS
  Filled 2020-04-14 (×3): qty 0.1

## 2020-04-14 MED ORDER — SODIUM CHLORIDE 0.45 % IV SOLN: INTRAVENOUS | Status: DC

## 2020-04-14 NOTE — TOC Initial Note (Addendum)
Transition of Care [facility]) - Initial/Assessment Note    Patient Details  Name: Jordan [name] MRN: [id number] Date of Birth: [birth year]  Transition of Care [facility]) CM/SW Contact:    Carley Hammed, LCSWA Phone Number: 04/14/2020, 10:03 AM  Clinical Narrative:                 CSW spoke with pt's sister and pt's son via conference call. They are agreeable to SNF placement and noted that they had family in [place] place. They also said they would be ok with pt going to a facility in [place]. Pt's sister will call back to advise if pt has had covid vaccines. CSW unable to confirm if insurance is managed through [employer] or [employer] due to the holiday. CSW will complete workup and faxout.  Pt has had both pfizer vaccines and booster   Expected Discharge Plan: Skilled Nursing Facility Barriers to Discharge: Insurance Authorization,SNF Pending bed offer   Patient Goals and CMS Choice        Expected Discharge Plan and Services Expected Discharge Plan: Skilled Nursing Facility       Living arrangements for the past 2 months: Independent Living Facility                                      Prior Living Arrangements/Services Living arrangements for the past 2 months: Independent Living Facility Lives with:: Self Patient language and need for interpreter reviewed:: Yes Do you feel safe going back to the place where you live?: Yes      Need for Family Participation in Patient Care: Yes (Comment) Care giver support system in place?: No (comment)   Criminal Activity/Legal Involvement Pertinent to Current Situation/Hospitalization: No - Comment as needed  Activities of Daily Living      Permission Sought/Granted Permission sought to share information with : Family Supports    Share Information with NAME: Deloris Sturdivent     Permission granted to share info w Relationship: sister  Permission granted to share info w Contact Information: [phone]  Emotional  Assessment Appearance:: Other (Comment Required (Unable to assess) Attitude/Demeanor/Rapport: Unable to Assess Affect (typically observed): Unable to Assess Orientation: : Oriented to Self,Oriented to Place Alcohol / Substance Use: Not Applicable    Admission diagnosis:  Complete heart block (HCC) [I44.2] AKI (acute kidney injury) (HCC) [N17.9] Traumatic rhabdomyolysis, initial encounter (HCC) [T79.6XXA] New onset type 2 diabetes mellitus (HCC) [E11.9] Cerebrovascular accident (CVA), unspecified mechanism (HCC) [I63.9] Hyperglycemic crisis due to diabetes mellitus (HCC) [E11.65] Patient Active Problem List   Diagnosis Date Noted  . Pressure injury of skin 04/10/2020  . New onset type 2 diabetes mellitus (HCC)   . AKI (acute kidney injury) (HCC)   . Acute on chronic respiratory failure with hypoxia (HCC)   . Hyperglycemic crisis due to diabetes mellitus (HCC) 04/07/2020  . Heart block AV complete (HCC)   . Unstable angina (HCC) 01/24/2020  . Chest pain 03/09/2018  . HTN (hypertension), benign   . History of ischemic left MCA stroke   . Ischemic stroke (HCC)   . Hyperlipidemia   . Essential hypertension   . CVA (cerebral vascular accident) (HCC) 10/23/2017   PCP:  Sherrie Mustache, MD Pharmacy:   [facility] [address] [place] - [address] [place] [address] Phone: [phone] Fax: [phone]     Social Determinants of Health (SDOH) Interventions  Readmission Risk Interventions No flowsheet data found.

## 2020-04-14 NOTE — Progress Notes (Signed)
Inpatient Diabetes Program Recommendations  AACE/ADA: New Consensus Statement on Inpatient Glycemic Control (2015)  Target Ranges:  Prepandial:   less than 140 mg/dL      Peak postprandial:   less than 180 mg/dL (1-2 hours)      Critically ill patients:  140 - 180 mg/dL   Lab Results  Component Value Date   GLUCAP 202 (H) 04/14/2020   HGBA1C 11.9 (H) 04/07/2020    Review of Glycemic Control  Diabetes history: New Onset  Inpatient Diabetes Program Recommendations:   Please consider Lantus 10 units daily.  Attempted to reach via phone but unable to speak with patient. Will reattempt.  Thank you, Billy Fischer. Khristie Sak, RN, MSN, CDE  Diabetes Coordinator Inpatient Glycemic Control Team Team Pager [phone] (8am-5pm) 04/14/2020 8:54 AM

## 2020-04-14 NOTE — Plan of Care (Signed)
  Problem: Education: Goal: Knowledge of General Education information will improve Description: Including pain rating scale, medication(s)/side effects and non-pharmacologic comfort measures Outcome: Progressing   Problem: Health Behavior/Discharge Planning: Goal: Ability to manage health-related needs will improve Outcome: Progressing   Problem: Clinical Measurements: Goal: Ability to maintain clinical measurements within normal limits will improve Outcome: Progressing Goal: Will remain free from infection Outcome: Progressing Goal: Diagnostic test results will improve Outcome: Progressing Goal: Respiratory complications will improve Outcome: Progressing Goal: Cardiovascular complication will be avoided Outcome: Progressing   Problem: Nutrition: Goal: Adequate nutrition will be maintained Outcome: Progressing   Problem: Elimination: Goal: Will not experience complications related to bowel motility Outcome: Progressing Goal: Will not experience complications related to urinary retention Outcome: Progressing   Problem: Pain Managment: Goal: General experience of comfort will improve Outcome: Progressing   Problem: Safety: Goal: Ability to remain free from injury will improve Outcome: Progressing   Problem: Education: Goal: Knowledge of cardiac device and self-care will improve Outcome: Progressing Goal: Ability to safely manage health related needs after discharge will improve Outcome: Progressing Goal: Individualized Educational Video(s) Outcome: Progressing   Problem: Cardiac: Goal: Ability to achieve and maintain adequate cardiopulmonary perfusion will improve Outcome: Progressing

## 2020-04-14 NOTE — Progress Notes (Signed)
PROGRESS NOTE    Jordan [name]  ZCH:[id number] DOB: 03-13-1946 DOA: 04/07/2020 PCP: Sherrie Mustache, MD    Brief Narrative:  Mr.Beehler is a 74 yo M w/ PMH of Dm, HTN, CKD, CAD presenting to [facility] after being found down. Found to be altered. Intubated for altered mental status. He was brought in as code stroke due to left neglect but was found to be bradycardic with HR in 30s. He was given atropine, cardiology was consulted. He was emergently intubated. He was also found to be in HHS with cbg >500.He was treated with insulin drip, transitioned off drip on 12/19. Extubated on 12/21/ 21.  MRI brain negative for acute CVA, had past CVA.  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  Transferred to [facility] on 12/23.    Assessment & Plan:   Active Problems:   Hyperglycemic crisis due to diabetes mellitus (HCC)   Heart block AV complete (HCC)   New onset type 2 diabetes mellitus (HCC)   AKI (acute kidney injury) (HCC)   Acute on chronic respiratory failure with hypoxia (HCC)   Pressure injury of skin   Malnutrition of moderate degree   HHC with new diagnosis of DM2 and with dysphagia.  HgbA1c of 11.9% Seen by SLP, on dysphagia diet.  Continue SSI.  Patient with need for prompting to eat.  12/24 He is eating more today but still not adequately. His mentation has improved, he is more verbal. Blood sugar elevated in am, starting Lantus 10 units daily as recommended per diabetes coordinator.   Complete Heart block.  S/p PPM placement on 12/20 with wound check today as stable.  12/24 Stable.   AKI on CKD stage 3.  Improving. Scr peaked at 3.06.   Hypernatremia Sodium trending up to 150 due to poor intake per mouth.  Started on gentle 1/2 NS infusion, recheck labs in am.   CAD No chest pain. Cath in 01/2020 with non-obstructive cad. Continue statin and ASA.   Pressure ulcers of bl heels, poa.  Continue wound care, pressure relieving services  Physical debility Seen by PT and OT. Needs SNF  placement. CM following.   Hx of CVA Continue statin and ASA.  PT, OT, and SLP   Non-severe (moderate) malnutrition Poa. Dietitian consulted, patient to start nutritional supplements.    DVT prophylaxis: start heparin Sq Code Status: Full Family Communication: None today Disposition Plan: To SNF. Not medically ready for dc today due to poor intake per mouth and hypernatremia. Patient from home.    Consultants:   Cardiology  Procedures:  PPM 04/10/20   Antimicrobials:  none  Subjective: Patient reports feeling better today, more alert. He has not eaten lunch but had some breakfast. Denies nausea.   Objective: Vitals:   04/14/20 0306 04/14/20 0748 04/14/20 1142 04/14/20 1515  BP: 139/64 137/60 (!) 153/78 (!) 146/71  Pulse: 80 65 75 67  Resp: 20 18 17 19   Temp: 98.6 F (37 C) 98.7 F (37.1 C) 98.7 F (37.1 C) 98.7 F (37.1 C)  TempSrc: Oral Oral Oral Oral  SpO2: 98% 94% 95% 97%  Weight:      Height:        Intake/Output Summary (Last 24 hours) at 04/14/2020 1609 Last data filed at 04/14/2020 0300 Gross per 24 hour  Intake 786 ml  Output --  Net 786 ml   Filed Weights   04/08/20 0300 04/09/20 0635 04/10/20 0510  Weight: 74.8 kg 74.8 kg 79.8 kg    Examination:  General exam:  Appears calm and comfortable  Respiratory system: Clear to auscultation. Respiratory effort normal. Cardiovascular system: S1 & S2  present RRR.  Gastrointestinal system: Abdomen is nondistended, soft and nontender. Central nervous system: Alert and oriented to self only. Slow to respond, able to move ext x4. No dysarthria noted.  Extremities: No edema or cyanosis. Skin: Bilateral heel wounds Psychiatry:  Mood & affect appropriate.     Data Reviewed: I have personally reviewed following labs   CBC: Recent Labs  Lab 04/09/20 0250 04/10/20 0331 04/11/20 0357 04/12/20 0504 04/14/20 0053  WBC 12.4* 13.8* 13.3* 11.3* 10.6*  HGB 11.4* 10.7* 10.2* 10.8* 11.3*  HCT 36.8*  34.3* 33.2* 34.1* 36.6*  MCV 93.9 93.7 94.6 93.2 93.6  PLT 143* 141* 144* 183 212   Basic Metabolic Panel: Recent Labs  Lab 04/08/20 0439 04/08/20 1113 04/09/20 0250 04/10/20 0331 04/11/20 0357 04/12/20 0504 04/14/20 0053  NA 145   < > 144 146* 142 148* 150*  K 3.7   < > 4.3 3.5 3.8 3.7 3.7  CL 109   < > 112* 110 109 115* 118*  CO2 27   < > 25 27 22 22 23   GLUCOSE 232*   < > 199* 111* 224* 197* 215*  BUN 36*   < > 25* 23 24* 30* 24*  CREATININE 2.03*   < > 1.62* 1.84* 1.80* 1.85* 1.63*  CALCIUM 9.0   < > 8.5* 8.4* 8.1* 8.6* 8.6*  MG 2.6*  --   --  2.5*  --  2.7* 2.8*  PHOS 1.9*  --   --   --   --   --   --    < > = values in this interval not displayed.   GFR: Estimated Creatinine Clearance: 42.3 mL/min (A) (by C-G formula based on SCr of 1.63 mg/dL (H)). Liver Function Tests: Recent Labs  Lab 04/12/20 0504  AST 47*  ALT 34  ALKPHOS 63  BILITOT 0.8  PROT 5.3*  ALBUMIN 1.6*   No results for input(s): LIPASE, AMYLASE in the last 168 hours. Recent Labs  Lab 04/07/20 1907  AMMONIA 28   Coagulation Profile: Recent Labs  Lab 04/10/20 0331  INR 1.3*   Cardiac Enzymes: Recent Labs  Lab 04/10/20 0331 04/12/20 0504  CKTOTAL 970* 469*  CKMB 3.2  --    BNP (last 3 results) No results for input(s): PROBNP in the last 8760 hours. HbA1C: No results for input(s): HGBA1C in the last 72 hours. CBG: Recent Labs  Lab 04/13/20 1149 04/13/20 1603 04/13/20 2238 04/14/20 0610 04/14/20 1139  GLUCAP 204* 319* 153* 202* 190*   Lipid Profile: No results for input(s): CHOL, HDL, LDLCALC, TRIG, CHOLHDL, LDLDIRECT in the last 72 hours. Thyroid Function Tests: No results for input(s): TSH, T4TOTAL, FREET4, T3FREE, THYROIDAB in the last 72 hours. Anemia Panel: No results for input(s): VITAMINB12, FOLATE, FERRITIN, TIBC, IRON, RETICCTPCT in the last 72 hours. Sepsis Labs: No results for input(s): PROCALCITON, LATICACIDVEN in the last 168 hours.  Recent Results (from  the past 240 hour(s))  Resp Panel by RT-PCR (Flu A&B, Covid) Nasopharyngeal Swab     Status: None   Collection Time: 04/07/20  9:07 AM   Specimen: Nasopharyngeal Swab; Nasopharyngeal(NP) swabs in vial transport medium  Result Value Ref Range Status   SARS Coronavirus 2 by RT PCR NEGATIVE NEGATIVE Final    Comment: (NOTE) SARS-CoV-2 target nucleic acids are NOT DETECTED.  The SARS-CoV-2 RNA is generally detectable in upper respiratory specimens during  the acute phase of infection. The lowest concentration of SARS-CoV-2 viral copies this assay can detect is 138 copies/mL. A negative result does not preclude SARS-Cov-2 infection and should not be used as the sole basis for treatment or other patient management decisions. A negative result may occur with  improper specimen collection/handling, submission of specimen other than nasopharyngeal swab, presence of viral mutation(s) within the areas targeted by this assay, and inadequate number of viral copies(<138 copies/mL). A negative result must be combined with clinical observations, patient history, and epidemiological information. The expected result is Negative.  Fact Sheet for Patients:  BloggerCourse.com  Fact Sheet for Healthcare Providers:  SeriousBroker.it  This test is no t yet approved or cleared by the [place] FDA and  has been authorized for detection and/or diagnosis of SARS-CoV-2 by FDA under an Emergency Use Authorization (EUA). This EUA will remain  in effect (meaning this test can be used) for the duration of the COVID-19 declaration under Section 564(b)(1) of the Act, 21 U.S.C.section 360bbb-3(b)(1), unless the authorization is terminated  or revoked sooner.       Influenza A by PCR NEGATIVE NEGATIVE Final   Influenza B by PCR NEGATIVE NEGATIVE Final    Comment: (NOTE) The Xpert Xpress SARS-CoV-2/FLU/RSV plus assay is intended as an aid in the diagnosis of  influenza from Nasopharyngeal swab specimens and should not be used as a sole basis for treatment. Nasal washings and aspirates are unacceptable for Xpert Xpress SARS-CoV-2/FLU/RSV testing.  Fact Sheet for Patients: BloggerCourse.com  Fact Sheet for Healthcare Providers: SeriousBroker.it  This test is not yet approved or cleared by the [place] FDA and has been authorized for detection and/or diagnosis of SARS-CoV-2 by FDA under an Emergency Use Authorization (EUA). This EUA will remain in effect (meaning this test can be used) for the duration of the COVID-19 declaration under Section 564(b)(1) of the Act, 21 U.S.C. section 360bbb-3(b)(1), unless the authorization is terminated or revoked.  Performed at [facility], [address], [place], [place] [address]   MRSA PCR Screening     Status: Abnormal   Collection Time: 04/07/20 10:37 PM   Specimen: Nasopharyngeal  Result Value Ref Range Status   MRSA by PCR POSITIVE (A) NEGATIVE Final    Comment:        The GeneXpert MRSA Assay (FDA approved for NASAL specimens only), is one component of a comprehensive MRSA colonization surveillance program. It is not intended to diagnose MRSA infection nor to guide or monitor treatment for MRSA infections. RESULT CALLED TO, READ BACK BY AND VERIFIED WITH: B NGUYEN RN 04/08/20 4235 JDW Performed at [facility], [address] [place], [place] [address]          Radiology Studies: No results found.      Scheduled Meds: . (feeding supplement) PROSource Plus  30 mL Oral BID BM  . aspirin  81 mg Oral Daily  . Chlorhexidine Gluconate Cloth  6 each Topical Daily  . docusate  100 mg Oral BID  . feeding supplement (GLUCERNA SHAKE)  237 mL Oral TID BM  . heparin injection (subcutaneous)  5,000 Units Subcutaneous Q8H  . insulin aspart  0-9 Units Subcutaneous TID WC  . insulin glargine  10 Units Subcutaneous Daily   . mouth rinse  15 mL Mouth Rinse BID  . pantoprazole  40 mg Oral Daily  . polyethylene glycol  17 g Oral Daily  . rosuvastatin  20 mg Oral Daily  . sodium  chloride flush  10-40 mL Intracatheter Q12H   Continuous Infusions: . sodium chloride       LOS: 7 days    Time spent: 25 minutes    Ky Barban, MD [facility]   If 7PM-7AM, please contact night-coverage [email or web address] Password [facility] 04/14/2020, 4:09 PM

## 2020-04-14 NOTE — Plan of Care (Signed)
  Problem: Education: Goal: Knowledge of General Education information will improve Description Including pain rating scale, medication(s)/side effects and non-pharmacologic comfort measures Outcome: Progressing   

## 2020-04-14 NOTE — NC FL2 (Signed)
[place] MEDICAID FL2 LEVEL OF CARE SCREENING TOOL     IDENTIFICATION  Patient Name: Jordan [name] Birthdate: Jun 15, 1945 Sex: male Admission Date (Current Location): 04/07/2020  [facility] and [place] Number:  Producer, television/film/video and Address:  [facility] [place], [address] [place], [place] [address]      Provider Number: [id number]  Attending Physician Name and Address:  Ky Barban, MD  Relative Name and Phone Number:  Deloris, [phone]    Current Level of Care: Hospital Recommended Level of Care: Skilled Nursing Facility Prior Approval Number:    Date Approved/Denied:   PASRR Number: [id number]  Discharge Plan: SNF    Current Diagnoses: Patient Active Problem List   Diagnosis Date Noted  . Pressure injury of skin 04/10/2020  . New onset type 2 diabetes mellitus (HCC)   . AKI (acute kidney injury) (HCC)   . Acute on chronic respiratory failure with hypoxia (HCC)   . Hyperglycemic crisis due to diabetes mellitus (HCC) 04/07/2020  . Heart block AV complete (HCC)   . Unstable angina (HCC) 01/24/2020  . Chest pain 03/09/2018  . HTN (hypertension), benign   . History of ischemic left MCA stroke   . Ischemic stroke (HCC)   . Hyperlipidemia   . Essential hypertension   . CVA (cerebral vascular accident) (HCC) 10/23/2017    Orientation RESPIRATION BLADDER Height & Weight     Self,Place  Normal External catheter Weight: 175 lb 14.8 oz (79.8 kg) Height:  5\' 11"  (180.3 cm)  BEHAVIORAL SYMPTOMS/MOOD NEUROLOGICAL BOWEL NUTRITION STATUS      Incontinent Diet (See DC summary)  AMBULATORY STATUS COMMUNICATION OF NEEDS Skin   Extensive Assist Verbally PU Stage and Appropriate Care (Pressure Injury L & R heels, Stage 1 on L hip)                       Personal Care Assistance Level of Assistance  Bathing,Feeding,Dressing Bathing Assistance: Maximum assistance Feeding assistance: Limited assistance Dressing Assistance: Maximum  assistance     Functional Limitations Info  Sight,Hearing,Speech Sight Info: Adequate Hearing Info: Adequate Speech Info: Adequate    SPECIAL CARE FACTORS FREQUENCY  PT (By licensed PT),OT (By licensed OT)     PT Frequency: 5x week OT Frequency: 5x week            Contractures Contractures Info: Not present    Additional Factors Info  Code Status,Allergies,Insulin Sliding Scale Code Status Info: Full Allergies Info: NKA   Insulin Sliding Scale Info: Insulin Aspart (Novolog) 0-9 U 3x daily W/ meals, Insulin Glargine Lantus 10 U daily.       Current Medications (04/14/2020):  This is the current hospital active medication list Current Facility-Administered Medications  Medication Dose Route Frequency Provider Last Rate Last Admin  . (feeding supplement) PROSource Plus liquid 30 mL  30 mL Oral BID BM 04/16/2020 D, MD      . 0.45 % sodium chloride infusion   Intravenous Continuous Sara Chu D, MD      . acetaminophen (TYLENOL) tablet 325-650 mg  325-650 mg Oral Q4H PRN Sara Chu, MD      . aspirin chewable tablet 81 mg  81 mg Oral Daily Lanier Prude, RPH   81 mg at 04/14/20 1014  . Chlorhexidine Gluconate Cloth 2 % PADS 6 each  6 each Topical Daily 04/16/20, MD   6 each at 04/13/20 2113  . dextrose 50 % solution  0-50 mL  0-50 mL Intravenous PRN Lanier Prude, MD      . docusate (COLACE) 50 MG/5ML liquid 100 mg  100 mg Oral BID Mosetta Anis, RPH   100 mg at 04/14/20 1014  . feeding supplement (GLUCERNA SHAKE) (GLUCERNA SHAKE) liquid 237 mL  237 mL Oral TID BM Sara Chu D, MD   237 mL at 04/13/20 2017  . heparin injection 5,000 Units  5,000 Units Subcutaneous Q8H Sara Chu D, MD   5,000 Units at 04/14/20 [phone]  . hydrALAZINE (APRESOLINE) injection 10-20 mg  10-20 mg Intravenous Q4H PRN Lanier Prude, MD   20 mg at 04/12/20 1014  . insulin aspart (novoLOG) injection 0-9 Units  0-9 Units Subcutaneous TID WC  Raymon Mutton F, NP   3 Units at 04/14/20 [phone]  . insulin glargine (LANTUS) injection 10 Units  10 Units Subcutaneous Daily Ky Barban, MD   10 Units at 04/14/20 1016  . MEDLINE mouth rinse  15 mL Mouth Rinse BID Briant Sites, DO   15 mL at 04/13/20 2113  . ondansetron (ZOFRAN) injection 4 mg  4 mg Intravenous Q6H PRN Lanier Prude, MD      . pantoprazole (PROTONIX) EC tablet 40 mg  40 mg Oral Daily Mosetta Anis, RPH   40 mg at 04/14/20 1014  . polyethylene glycol (MIRALAX / GLYCOLAX) packet 17 g  17 g Oral Daily Mosetta Anis, [facility]      . rosuvastatin (CRESTOR) tablet 20 mg  20 mg Oral Daily Mosetta Anis, RPH   20 mg at 04/13/20 0932  . sodium chloride flush (NS) 0.9 % injection 10-40 mL  10-40 mL Intracatheter Q12H Lanier Prude, MD   10 mL at 04/13/20 0931  . sodium chloride flush (NS) 0.9 % injection 10-40 mL  10-40 mL Intracatheter PRN Lanier Prude, MD   10 mL at 04/12/20 1006   Facility-Administered Medications Ordered in Other Encounters  Medication Dose Route Frequency Provider Last Rate Last Admin  . sodium chloride flush (NS) 0.9 % injection 3 mL  3 mL Intravenous Q12H Laurier Nancy, MD         Discharge Medications: Please see discharge summary for a list of discharge medications.  Relevant Imaging Results:  Relevant Lab Results:   Additional Information SS# [id number], [address]

## 2020-04-15 LAB — BASIC METABOLIC PANEL
Anion gap: 8 (ref 5–15)
BUN: 21 mg/dL (ref 8–23)
CO2: 25 mmol/L (ref 22–32)
Calcium: 8.5 mg/dL — ABNORMAL LOW (ref 8.9–10.3)
Chloride: 116 mmol/L — ABNORMAL HIGH (ref 98–111)
Creatinine, Ser: 1.47 mg/dL — ABNORMAL HIGH (ref 0.61–1.24)
GFR, Estimated: 50 mL/min — ABNORMAL LOW (ref >60)
Glucose, Bld: 201 mg/dL — ABNORMAL HIGH (ref 70–99)
Potassium: 3.8 mmol/L (ref 3.5–5.1)
Sodium: 149 mmol/L — ABNORMAL HIGH (ref 135–145)

## 2020-04-15 LAB — CBC
HCT: 33.5 % — ABNORMAL LOW (ref 39.0–52.0)
Hemoglobin: 10.7 g/dL — ABNORMAL LOW (ref 13.0–17.0)
MCH: 29.3 pg (ref 26.0–34.0)
MCHC: 31.9 g/dL (ref 30.0–36.0)
MCV: 91.8 fL (ref 80.0–100.0)
Platelets: 242 10*3/uL (ref 150–400)
RBC: 3.65 MIL/uL — ABNORMAL LOW (ref 4.22–5.81)
RDW: 13.5 % (ref 11.5–15.5)
WBC: 13 10*3/uL — ABNORMAL HIGH (ref 4.0–10.5)
nRBC: 0.3 % — ABNORMAL HIGH (ref 0.0–0.2)

## 2020-04-15 LAB — GLUCOSE, CAPILLARY
Glucose-Capillary: 164 mg/dL — ABNORMAL HIGH (ref 70–99)
Glucose-Capillary: 189 mg/dL — ABNORMAL HIGH (ref 70–99)
Glucose-Capillary: 265 mg/dL — ABNORMAL HIGH (ref 70–99)
Glucose-Capillary: 61 mg/dL — ABNORMAL LOW (ref 70–99)
Glucose-Capillary: 91 mg/dL (ref 70–99)

## 2020-04-15 NOTE — Progress Notes (Signed)
PROGRESS NOTE    Jordan [name]  WNI:[id number] DOB: 16-May-1945 DOA: 04/07/2020 PCP: Sherrie Mustache, MD    Brief Narrative:  Jordan [name] is a 74 yo M w/ PMH of Dm, HTN, CKD, CAD presenting to [facility] after being found down. Found to be altered. Intubated for altered mental status. He was brought in as code stroke due to left neglect but was found to be bradycardic with HR in 30s. He was given atropine, cardiology was consulted. He was emergently intubated. He was also found to be in HHS with cbg >500.He was treated with insulin drip, transitioned off drip on 12/19. Extubated on 12/21/ 21.  MRI brain negative for acute CVA, had past CVA.  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  Transferred to [facility] on 12/23.   Assessment & Plan:   Active Problems:   Hyperglycemic crisis due to diabetes mellitus (HCC)   Heart block AV complete (HCC)   New onset type 2 diabetes mellitus (HCC)   AKI (acute kidney injury) (HCC)   Acute on chronic respiratory failure with hypoxia (HCC)   Pressure injury of skin   Malnutrition of moderate degree   New onset Diabetes Mellitus type 2, with HHS - A1c 11.9 -overall blood sugars are improved -currently on lantus and SSI -continue to follow blood sugars  Complete heart block -s/p PPM on 12/20 -follow up in EP clinic  AKI on CKD 3b -baseline creatinine around 1.7 -admission creatinine 3.06 -with IV hydration, creatinine has improved to 1.4 today   Hypernatremia -Likely related to free water deficit -Continue on hypotonic fluids  Coronary artery disease -No chest pain at this time -Cardiac cath in 01/2020 with nonobstructive disease -Continue statin and aspirin  Metabolic encephalopathy -Suspect related to severe hyperglycemia/dehydration -MRI brain negative for acute CVA -Overall mental status has improved with metabolic correction  Generalized weakness and deconditioning -Seen by physical therapy with recommendations for skilled nursing facility  placement   DVT prophylaxis: heparin injection 5,000 Units Start: 04/13/20 2200 Place and maintain sequential compression device Start: 04/11/20 0654 SCDs Start: 04/07/20 1219  Code Status: full code Family Communication: updated sister Jordan [name] over the phone Disposition Plan: Status is: Inpatient  Remains inpatient appropriate because:Persistent severe electrolyte disturbances, Unsafe d/c plan and IV treatments appropriate due to intensity of illness or inability to take PO   Dispo: The patient is from: Home              Anticipated d/c is to: SNF              Anticipated d/c date is: 2 days              Patient currently is not medically stable to d/c.         Consultants:     Procedures:   12/20 PPM placement  12/17 temporary transvenous pacer   Antimicrobials:       Subjective: No new complaints. No shortness of breath  Objective: Vitals:   04/15/20 0325 04/15/20 0800 04/15/20 1125 04/15/20 1539  BP: (!) 156/61 (!) 135/54 (!) 154/84 (!) 157/68  Pulse: 63 75 70 67  Resp: 17 20 20 20   Temp: 99 F (37.2 C) 97.7 F (36.5 C) 97.7 F (36.5 C) 97.7 F (36.5 C)  TempSrc: Oral Oral Oral Oral  SpO2: 94% 93% 93% 94%  Weight:      Height:        Intake/Output Summary (Last 24 hours) at 04/15/2020 1717 Last data filed at 04/15/2020 1640 Gross per  24 hour  Intake 1273.33 ml  Output 1150 ml  Net 123.33 ml   Filed Weights   04/08/20 0300 04/09/20 0635 04/10/20 0510  Weight: 74.8 kg 74.8 kg 79.8 kg    Examination:  General exam: Appears calm and comfortable  Respiratory system: Clear to auscultation. Respiratory effort normal. Cardiovascular system: S1 & S2 heard, RRR. No JVD, murmurs, rubs, gallops or clicks. No pedal edema. Gastrointestinal system: Abdomen is nondistended, soft and nontender. No organomegaly or masses felt. Normal bowel sounds heard. Central nervous system: Alert and oriented. No focal neurological deficits. Extremities: Symmetric  5 x 5 power. Skin: No rashes, lesions or ulcers Psychiatry: Judgement and insight appear normal. Mood & affect appropriate.     Data Reviewed: I have personally reviewed following labs and imaging studies  CBC: Recent Labs  Lab 04/10/20 0331 04/11/20 0357 04/12/20 0504 04/14/20 0053 04/15/20 0043  WBC 13.8* 13.3* 11.3* 10.6* 13.0*  HGB 10.7* 10.2* 10.8* 11.3* 10.7*  HCT 34.3* 33.2* 34.1* 36.6* 33.5*  MCV 93.7 94.6 93.2 93.6 91.8  PLT 141* 144* 183 212 242   Basic Metabolic Panel: Recent Labs  Lab 04/10/20 0331 04/11/20 0357 04/12/20 0504 04/14/20 0053 04/15/20 0043  NA 146* 142 148* 150* 149*  K 3.5 3.8 3.7 3.7 3.8  CL 110 109 115* 118* 116*  CO2 27 22 22 23 25   GLUCOSE 111* 224* 197* 215* 201*  BUN 23 24* 30* 24* 21  CREATININE 1.84* 1.80* 1.85* 1.63* 1.47*  CALCIUM 8.4* 8.1* 8.6* 8.6* 8.5*  MG 2.5*  --  2.7* 2.8*  --    GFR: Estimated Creatinine Clearance: 47 mL/min (A) (by C-G formula based on SCr of 1.47 mg/dL (H)). Liver Function Tests: Recent Labs  Lab 04/12/20 0504  AST 47*  ALT 34  ALKPHOS 63  BILITOT 0.8  PROT 5.3*  ALBUMIN 1.6*   No results for input(s): LIPASE, AMYLASE in the last 168 hours. No results for input(s): AMMONIA in the last 168 hours. Coagulation Profile: Recent Labs  Lab 04/10/20 0331  INR 1.3*   Cardiac Enzymes: Recent Labs  Lab 04/10/20 0331 04/12/20 0504  CKTOTAL 970* 469*  CKMB 3.2  --    BNP (last 3 results) No results for input(s): PROBNP in the last 8760 hours. HbA1C: No results for input(s): HGBA1C in the last 72 hours. CBG: Recent Labs  Lab 04/14/20 1606 04/14/20 2105 04/15/20 0608 04/15/20 1216 04/15/20 1539  GLUCAP 127* 197* 164* 189* 265*   Lipid Profile: No results for input(s): CHOL, HDL, LDLCALC, TRIG, CHOLHDL, LDLDIRECT in the last 72 hours. Thyroid Function Tests: No results for input(s): TSH, T4TOTAL, FREET4, T3FREE, THYROIDAB in the last 72 hours. Anemia Panel: No results for input(s):  VITAMINB12, FOLATE, FERRITIN, TIBC, IRON, RETICCTPCT in the last 72 hours. Sepsis Labs: No results for input(s): PROCALCITON, LATICACIDVEN in the last 168 hours.  Recent Results (from the past 240 hour(s))  Resp Panel by RT-PCR (Flu A&B, Covid) Nasopharyngeal Swab     Status: None   Collection Time: 04/07/20  9:07 AM   Specimen: Nasopharyngeal Swab; Nasopharyngeal(NP) swabs in vial transport medium  Result Value Ref Range Status   SARS Coronavirus 2 by RT PCR NEGATIVE NEGATIVE Final    Comment: (NOTE) SARS-CoV-2 target nucleic acids are NOT DETECTED.  The SARS-CoV-2 RNA is generally detectable in upper respiratory specimens during the acute phase of infection. The lowest concentration of SARS-CoV-2 viral copies this assay can detect is 138 copies/mL. A negative result does not preclude  SARS-Cov-2 infection and should not be used as the sole basis for treatment or other patient management decisions. A negative result may occur with  improper specimen collection/handling, submission of specimen other than nasopharyngeal swab, presence of viral mutation(s) within the areas targeted by this assay, and inadequate number of viral copies(<138 copies/mL). A negative result must be combined with clinical observations, patient history, and epidemiological information. The expected result is Negative.  Fact Sheet for Patients:  BloggerCourse.com  Fact Sheet for Healthcare Providers:  SeriousBroker.it  This test is no t yet approved or cleared by the [place] FDA and  has been authorized for detection and/or diagnosis of SARS-CoV-2 by FDA under an Emergency Use Authorization (EUA). This EUA will remain  in effect (meaning this test can be used) for the duration of the COVID-19 declaration under Section 564(b)(1) of the Act, 21 U.S.C.section 360bbb-3(b)(1), unless the authorization is terminated  or revoked sooner.       Influenza A  by PCR NEGATIVE NEGATIVE Final   Influenza B by PCR NEGATIVE NEGATIVE Final    Comment: (NOTE) The Xpert Xpress SARS-CoV-2/FLU/RSV plus assay is intended as an aid in the diagnosis of influenza from Nasopharyngeal swab specimens and should not be used as a sole basis for treatment. Nasal washings and aspirates are unacceptable for Xpert Xpress SARS-CoV-2/FLU/RSV testing.  Fact Sheet for Patients: BloggerCourse.com  Fact Sheet for Healthcare Providers: SeriousBroker.it  This test is not yet approved or cleared by the [place] FDA and has been authorized for detection and/or diagnosis of SARS-CoV-2 by FDA under an Emergency Use Authorization (EUA). This EUA will remain in effect (meaning this test can be used) for the duration of the COVID-19 declaration under Section 564(b)(1) of the Act, 21 U.S.C. section 360bbb-3(b)(1), unless the authorization is terminated or revoked.  Performed at [facility], [address], [place], [place] [address]   MRSA PCR Screening     Status: Abnormal   Collection Time: 04/07/20 10:37 PM   Specimen: Nasopharyngeal  Result Value Ref Range Status   MRSA by PCR POSITIVE (A) NEGATIVE Final    Comment:        The GeneXpert MRSA Assay (FDA approved for NASAL specimens only), is one component of a comprehensive MRSA colonization surveillance program. It is not intended to diagnose MRSA infection nor to guide or monitor treatment for MRSA infections. RESULT CALLED TO, READ BACK BY AND VERIFIED WITH: B NGUYEN RN 04/08/20 9924 JDW Performed at [facility], [address], [place], [place] [address]          Radiology Studies: No results found.      Scheduled Meds: . (feeding supplement) PROSource Plus  30 mL Oral BID BM  . aspirin  81 mg Oral Daily  . Chlorhexidine Gluconate Cloth  6 each Topical Daily  . docusate  100 mg Oral BID  . feeding supplement (GLUCERNA  SHAKE)  237 mL Oral TID BM  . heparin injection (subcutaneous)  5,000 Units Subcutaneous Q8H  . insulin aspart  0-9 Units Subcutaneous TID WC  . insulin glargine  10 Units Subcutaneous Daily  . mouth rinse  15 mL Mouth Rinse BID  . pantoprazole  40 mg Oral Daily  . polyethylene glycol  17 g Oral Daily  . rosuvastatin  20 mg Oral Daily  . sodium chloride flush  10-40 mL Intracatheter Q12H   Continuous Infusions: . sodium chloride 50 mL/hr at 04/15/20 0644     LOS: 8  days    Time spent:    Erick Blinks, MD [facility]   If 7PM-7AM, please contact night-coverage [email or web address]  04/15/2020, 5:17 PM

## 2020-04-16 LAB — RENAL FUNCTION PANEL
Albumin: 1.6 g/dL — ABNORMAL LOW (ref 3.5–5.0)
Anion gap: 8 (ref 5–15)
BUN: 18 mg/dL (ref 8–23)
CO2: 23 mmol/L (ref 22–32)
Calcium: 8.2 mg/dL — ABNORMAL LOW (ref 8.9–10.3)
Chloride: 115 mmol/L — ABNORMAL HIGH (ref 98–111)
Creatinine, Ser: 1.46 mg/dL — ABNORMAL HIGH (ref 0.61–1.24)
GFR, Estimated: 50 mL/min — ABNORMAL LOW (ref >60)
Glucose, Bld: 121 mg/dL — ABNORMAL HIGH (ref 70–99)
Phosphorus: 2.4 mg/dL — ABNORMAL LOW (ref 2.5–4.6)
Potassium: 3.4 mmol/L — ABNORMAL LOW (ref 3.5–5.1)
Sodium: 146 mmol/L — ABNORMAL HIGH (ref 135–145)

## 2020-04-16 LAB — CBC
HCT: 35.1 % — ABNORMAL LOW (ref 39.0–52.0)
Hemoglobin: 10.9 g/dL — ABNORMAL LOW (ref 13.0–17.0)
MCH: 29.1 pg (ref 26.0–34.0)
MCHC: 31.1 g/dL (ref 30.0–36.0)
MCV: 93.9 fL (ref 80.0–100.0)
Platelets: 259 10*3/uL (ref 150–400)
RBC: 3.74 MIL/uL — ABNORMAL LOW (ref 4.22–5.81)
RDW: 13.5 % (ref 11.5–15.5)
WBC: 11.5 10*3/uL — ABNORMAL HIGH (ref 4.0–10.5)
nRBC: 0.6 % — ABNORMAL HIGH (ref 0.0–0.2)

## 2020-04-16 LAB — MAGNESIUM: Magnesium: 2.5 mg/dL — ABNORMAL HIGH (ref 1.7–2.4)

## 2020-04-16 LAB — GLUCOSE, CAPILLARY
Glucose-Capillary: 113 mg/dL — ABNORMAL HIGH (ref 70–99)
Glucose-Capillary: 175 mg/dL — ABNORMAL HIGH (ref 70–99)
Glucose-Capillary: 58 mg/dL — ABNORMAL LOW (ref 70–99)
Glucose-Capillary: 59 mg/dL — ABNORMAL LOW (ref 70–99)
Glucose-Capillary: 86 mg/dL (ref 70–99)
Glucose-Capillary: 232 mg/dL — ABNORMAL HIGH (ref 70–99)

## 2020-04-16 MED ORDER — GLIMEPIRIDE 2 MG PO TABS
2.0000 mg | ORAL_TABLET | Freq: Every day | ORAL | Status: DC
  Administered 2020-04-16: 09:00:00 2 mg via ORAL
  Filled 2020-04-16 (×2): qty 1

## 2020-04-16 MED ORDER — GLIMEPIRIDE 1 MG PO TABS
1.0000 mg | ORAL_TABLET | Freq: Every day | ORAL | Status: DC
  Administered 2020-04-17 – 2020-04-18 (×2): 1 mg via ORAL
  Filled 2020-04-16 (×2): qty 1

## 2020-04-16 MED ORDER — LOSARTAN POTASSIUM 50 MG PO TABS
100.0000 mg | ORAL_TABLET | Freq: Every day | ORAL | Status: DC
  Administered 2020-04-16: 13:00:00 100 mg via ORAL
  Filled 2020-04-16: qty 2

## 2020-04-16 NOTE — Progress Notes (Signed)
CRITICAL VALUE ALERT  Critical Value:  CBG 59  Date & Time Notied:  04/16/2020, 1658  Provider Notified: Dr. Kerry Hough  Orders Received/Actions taken: Hypoglycemic protocol applied  Post CBG: 86 and pt is still eating his dinner right now

## 2020-04-16 NOTE — Progress Notes (Signed)
   04/16/20 1921  What Happened  Was fall witnessed? No  Was patient injured? No  Patient found on floor;other (Comment) (Kneeling)  Found by Staff-comment Marsh Dolly RN)  Stated prior activity other (comment) (ambulating- Assistx1)  Follow Up  MD notified Dr Samule Ohm  Time MD notified [phone]  Family notified Yes - comment  Time family notified 2000  Additional tests No  Progress note created (see row info) Yes  Adult Fall Risk Assessment  Risk Factor Category (scoring not indicated) Fall has occurred during this admission (document High fall risk)  Age 74  Fall History: Fall within 6 months prior to admission 0  Elimination; Bowel and/or Urine Incontinence 0  Elimination; Bowel and/or Urine Urgency/Frequency 2  Medications: includes PCA/Opiates, Anti-convulsants, Anti-hypertensives, Diuretics, Hypnotics, Laxatives, Sedatives, and Psychotropics 5  Patient Care Equipment 2  Mobility-Assistance 2  Mobility-Gait 2  Mobility-Sensory Deficit 0  Altered awareness of immediate physical environment 1  Impulsiveness 2  Lack of understanding of one's physical/cognitive limitations 4  Total Score 22  Patient Fall Risk Level High fall risk  Adult Fall Risk Interventions  Required Bundle Interventions *See Row Information* High fall risk - low, moderate, and high requirements implemented  Additional Interventions Use of appropriate toileting equipment (bedpan, BSC, etc.)  Screening for Fall Injury Risk (To be completed on HIGH fall risk patients) - Assessing Need for Low Bed  Risk For Fall Injury- Low Bed Criteria None identified - Continue screening  Screening for Fall Injury Risk (To be completed on HIGH fall risk patients who do not meet crieteria for Low Bed) - Assessing Need for Floor Mats Only  Risk For Fall Injury- Criteria for Floor Mats Confusion/dementia (+NuDESC, CIWA, TBI, etc.)  Will Implement Floor Mats Yes  Vitals  Temp 99 F (37.2 C)  Temp Source Oral  BP (!) 150/72   MAP (mmHg) 94  BP Location Left Arm  BP Method Automatic  Patient Position (if appropriate) Lying  Pulse Rate 75  ECG Heart Rate 75  Resp 20  Oxygen Therapy  SpO2 98 %  O2 Device Room Air  Pain Assessment  Pain Scale 0-10  Pain Score 0  Neurological  Neuro (WDL) X  Level of Consciousness Alert  Orientation Level Oriented to person;Oriented to place;Disoriented to time;Disoriented to situation  Human resources officer  Pupil Assessment  No  Neuro Symptoms Forgetful  Glasgow Coma Scale  Eye Opening 4  Best Verbal Response (NON-intubated) 4  Best Motor Response 6  [place]asgow Coma Scale Score 14  Musculoskeletal  Musculoskeletal (WDL) X  Assistive Device Front wheel walker  Generalized Weakness Yes  Weight Bearing Restrictions Yes  LUE Weight Bearing NWB  Integumentary  Integumentary (WDL) X  Skin Color Appropriate for ethnicity  Skin Condition Dry  Skin Integrity Surgical Incision (see LDA)  Abrasion Location Back  Abrasion Location Orientation Left  Description of Blister Serous  Blister Location Hip  Blister Location Orientation Left  Blister Intervention Foam  Fissure Location Buttocks  Fissure Location Orientation Medial  Fissure Intervention Foam  Skin Turgor Non-tenting

## 2020-04-16 NOTE — Plan of Care (Signed)

## 2020-04-16 NOTE — Progress Notes (Signed)
PROGRESS NOTE    Jordan [name]  VOJ:[id number] DOB: 18-Jan-1946 DOA: 04/07/2020 PCP: Sherrie Mustache, MD    Brief Narrative:  Jordan [name] is a 74 yo M w/ PMH of Dm, HTN, CKD, CAD presenting to [facility] after being found down. Found to be altered. Intubated for altered mental status. He was brought in as code stroke due to left neglect but was found to be bradycardic with HR in 30s. He was given atropine, cardiology was consulted. He was emergently intubated. He was also found to be in HHS with cbg >500.He was treated with insulin drip, transitioned off drip on 12/19. Extubated on 12/21/ 21.  MRI brain negative for acute CVA, had past CVA.  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  Transferred to [facility] on 12/23.   Assessment & Plan:   Active Problems:   Hyperglycemic crisis due to diabetes mellitus (HCC)   Heart block AV complete (HCC)   New onset type 2 diabetes mellitus (HCC)   AKI (acute kidney injury) (HCC)   Acute on chronic respiratory failure with hypoxia (HCC)   Pressure injury of skin   Malnutrition of moderate degree   New onset Diabetes Mellitus type 2, with HHS - A1c 11.9 -overall blood sugars are improved -currently on lantus and SSI -continue to follow blood sugars -only on 10 units of basal insulin daily -Basal insulin discontinued and started on glimepiride  Complete heart block -s/p PPM on 12/20 -follow up in EP clinic  AKI on CKD 3b -baseline creatinine around 1.7 -admission creatinine 3.06 -with IV hydration, creatinine has improved to 1.4 today   Hypernatremia -Likely related to free water deficit -Improved with hypotonic fluids  Coronary artery disease -No chest pain at this time -Cardiac cath in 01/2020 with nonobstructive disease -Continue statin and aspirin  Metabolic encephalopathy -Suspect related to severe hyperglycemia/dehydration -MRI brain negative for acute CVA -Overall mental status has improved with metabolic correction  Generalized weakness  and deconditioning -Seen by physical therapy with recommendations for skilled nursing facility placement   DVT prophylaxis: heparin injection 5,000 Units Start: 04/13/20 2200 Place and maintain sequential compression device Start: 04/11/20 0654 SCDs Start: 04/07/20 1219  Code Status: full code Family Communication: updated sister Jordan [name] over the phone 12/25 Disposition Plan: Status is: Inpatient  Remains inpatient appropriate because:Persistent severe electrolyte disturbances, Unsafe d/c plan and IV treatments appropriate due to intensity of illness or inability to take PO   Dispo: The patient is from: Home              Anticipated d/c is to: SNF              Anticipated d/c date is: 1 day              Patient currently is medically stable to d/c.         Consultants:     Procedures:   12/20 PPM placement  12/17 temporary transvenous pacer   Antimicrobials:       Subjective: Denies any chest pain, shortness of breath, abdominal pain  Objective: Vitals:   04/16/20 0723 04/16/20 1156 04/16/20 1200 04/16/20 1519  BP: (!) 154/68 (!) 155/79 (!) 155/79 (!) 147/72  Pulse: 71  73 74  Resp: 18 18 20 10   Temp: 97.8 F (36.6 C) 97.8 F (36.6 C)  97.8 F (36.6 C)  TempSrc: Oral Oral  Oral  SpO2: 99% 98% 96% 98%  Weight:      Height:        Intake/Output Summary (  Last 24 hours) at 04/16/2020 1711 Last data filed at 04/16/2020 1523 Gross per 24 hour  Intake 360 ml  Output 1325 ml  Net -965 ml   Filed Weights   04/08/20 0300 04/09/20 0635 04/10/20 0510  Weight: 74.8 kg 74.8 kg 79.8 kg    Examination:  General exam: Alert, awake, no distress Respiratory system: Clear to auscultation. Respiratory effort normal. Cardiovascular system:RRR. No murmurs, rubs, gallops. Gastrointestinal system: Abdomen is nondistended, soft and nontender. No organomegaly or masses felt. Normal bowel sounds heard. Central nervous system: Alert and oriented. No focal  neurological deficits. Extremities: No C/C/E, +pedal pulses Skin: No rashes, lesions or ulcers Psychiatry: mildly confused, pleasant   Data Reviewed: I have personally reviewed following labs and imaging studies  CBC: Recent Labs  Lab 04/11/20 0357 04/12/20 0504 04/14/20 0053 04/15/20 0043 04/16/20 0445  WBC 13.3* 11.3* 10.6* 13.0* 11.5*  HGB 10.2* 10.8* 11.3* 10.7* 10.9*  HCT 33.2* 34.1* 36.6* 33.5* 35.1*  MCV 94.6 93.2 93.6 91.8 93.9  PLT 144* 183 212 242 259   Basic Metabolic Panel: Recent Labs  Lab 04/10/20 0331 04/11/20 0357 04/12/20 0504 04/14/20 0053 04/15/20 0043 04/16/20 0445  NA 146* 142 148* 150* 149* 146*  K 3.5 3.8 3.7 3.7 3.8 3.4*  CL 110 109 115* 118* 116* 115*  CO2 27 22 22 23 25 23   GLUCOSE 111* 224* 197* 215* 201* 121*  BUN 23 24* 30* 24* 21 18  CREATININE 1.84* 1.80* 1.85* 1.63* 1.47* 1.46*  CALCIUM 8.4* 8.1* 8.6* 8.6* 8.5* 8.2*  MG 2.5*  --  2.7* 2.8*  --  2.5*  PHOS  --   --   --   --   --  2.4*   GFR: Estimated Creatinine Clearance: 47.3 mL/min (A) (by C-G formula based on SCr of 1.46 mg/dL (H)). Liver Function Tests: Recent Labs  Lab 04/12/20 0504 04/16/20 0445  AST 47*  --   ALT 34  --   ALKPHOS 63  --   BILITOT 0.8  --   PROT 5.3*  --   ALBUMIN 1.6* 1.6*   No results for input(s): LIPASE, AMYLASE in the last 168 hours. No results for input(s): AMMONIA in the last 168 hours. Coagulation Profile: Recent Labs  Lab 04/10/20 0331  INR 1.3*   Cardiac Enzymes: Recent Labs  Lab 04/10/20 0331 04/12/20 0504  CKTOTAL 970* 469*  CKMB 3.2  --    BNP (last 3 results) No results for input(s): PROBNP in the last 8760 hours. HbA1C: No results for input(s): HGBA1C in the last 72 hours. CBG: Recent Labs  Lab 04/15/20 2111 04/15/20 2149 04/16/20 0635 04/16/20 1143 04/16/20 1639  GLUCAP 61* 91 113* 175* 58*   Lipid Profile: No results for input(s): CHOL, HDL, LDLCALC, TRIG, CHOLHDL, LDLDIRECT in the last 72 hours. Thyroid  Function Tests: No results for input(s): TSH, T4TOTAL, FREET4, T3FREE, THYROIDAB in the last 72 hours. Anemia Panel: No results for input(s): VITAMINB12, FOLATE, FERRITIN, TIBC, IRON, RETICCTPCT in the last 72 hours. Sepsis Labs: No results for input(s): PROCALCITON, LATICACIDVEN in the last 168 hours.  Recent Results (from the past 240 hour(s))  Resp Panel by RT-PCR (Flu A&B, Covid) Nasopharyngeal Swab     Status: None   Collection Time: 04/07/20  9:07 AM   Specimen: Nasopharyngeal Swab; Nasopharyngeal(NP) swabs in vial transport medium  Result Value Ref Range Status   SARS Coronavirus 2 by RT PCR NEGATIVE NEGATIVE Final    Comment: (NOTE) SARS-CoV-2 target nucleic acids  are NOT DETECTED.  The SARS-CoV-2 RNA is generally detectable in upper respiratory specimens during the acute phase of infection. The lowest concentration of SARS-CoV-2 viral copies this assay can detect is 138 copies/mL. A negative result does not preclude SARS-Cov-2 infection and should not be used as the sole basis for treatment or other patient management decisions. A negative result may occur with  improper specimen collection/handling, submission of specimen other than nasopharyngeal swab, presence of viral mutation(s) within the areas targeted by this assay, and inadequate number of viral copies(<138 copies/mL). A negative result must be combined with clinical observations, patient history, and epidemiological information. The expected result is Negative.  Fact Sheet for Patients:  BloggerCourse.comhttps://www.fda.gov/media/152166/download  Fact Sheet for Healthcare Providers:  SeriousBroker.ithttps://www.fda.gov/media/152162/download  This test is no t yet approved or cleared by the [place] FDA and  has been authorized for detection and/or diagnosis of SARS-CoV-2 by FDA under an Emergency Use Authorization (EUA). This EUA will remain  in effect (meaning this test can be used) for the duration of the COVID-19 declaration under  Section 564(b)(1) of the Act, 21 U.S.C.section 360bbb-3(b)(1), unless the authorization is terminated  or revoked sooner.       Influenza A by PCR NEGATIVE NEGATIVE Final   Influenza B by PCR NEGATIVE NEGATIVE Final    Comment: (NOTE) The Xpert Xpress SARS-CoV-2/FLU/RSV plus assay is intended as an aid in the diagnosis of influenza from Nasopharyngeal swab specimens and should not be used as a sole basis for treatment. Nasal washings and aspirates are unacceptable for Xpert Xpress SARS-CoV-2/FLU/RSV testing.  Fact Sheet for Patients: BloggerCourse.comhttps://www.fda.gov/media/152166/download  Fact Sheet for Healthcare Providers: SeriousBroker.ithttps://www.fda.gov/media/152162/download  This test is not yet approved or cleared by the [place] FDA and has been authorized for detection and/or diagnosis of SARS-CoV-2 by FDA under an Emergency Use Authorization (EUA). This EUA will remain in effect (meaning this test can be used) for the duration of the COVID-19 declaration under Section 564(b)(1) of the Act, 21 U.S.C. section 360bbb-3(b)(1), unless the authorization is terminated or revoked.  Performed at [facility], [address] [place], [place] [address]   MRSA PCR Screening     Status: Abnormal   Collection Time: 04/07/20 10:37 PM   Specimen: Nasopharyngeal  Result Value Ref Range Status   MRSA by PCR POSITIVE (A) NEGATIVE Final    Comment:        The GeneXpert MRSA Assay (FDA approved for NASAL specimens only), is one component of a comprehensive MRSA colonization surveillance program. It is not intended to diagnose MRSA infection nor to guide or monitor treatment for MRSA infections. RESULT CALLED TO, READ BACK BY AND VERIFIED WITH: B NGUYEN RN 04/08/20 [id number] JDW Performed at [facility], [address] [place], [place] [address]          Radiology Studies: No results found.      Scheduled Meds: . (feeding supplement) PROSource Plus  30 mL Oral BID BM  .  aspirin  81 mg Oral Daily  . Chlorhexidine Gluconate Cloth  6 each Topical Daily  . docusate  100 mg Oral BID  . feeding supplement (GLUCERNA SHAKE)  237 mL Oral TID BM  . glimepiride  2 mg Oral Q breakfast  . heparin injection (subcutaneous)  5,000 Units Subcutaneous Q8H  . insulin aspart  0-9 Units Subcutaneous TID WC  . losartan  100 mg Oral Daily  . mouth rinse  15 mL Mouth Rinse BID  . pantoprazole  40  mg Oral Daily  . polyethylene glycol  17 g Oral Daily  . rosuvastatin  20 mg Oral Daily  . sodium chloride flush  10-40 mL Intracatheter Q12H   Continuous Infusions:    LOS: 9 days    Time spent:    Erick Blinks, MD [facility]   If 7PM-7AM, please contact night-coverage [email or web address]  04/16/2020, 5:11 PM

## 2020-04-17 LAB — GLUCOSE, CAPILLARY
Glucose-Capillary: 148 mg/dL — ABNORMAL HIGH (ref 70–99)
Glucose-Capillary: 248 mg/dL — ABNORMAL HIGH (ref 70–99)
Glucose-Capillary: 134 mg/dL — ABNORMAL HIGH (ref 70–99)
Glucose-Capillary: 179 mg/dL — ABNORMAL HIGH (ref 70–99)

## 2020-04-17 LAB — BASIC METABOLIC PANEL
Anion gap: 9 (ref 5–15)
BUN: 16 mg/dL (ref 8–23)
CO2: 24 mmol/L (ref 22–32)
Calcium: 8.2 mg/dL — ABNORMAL LOW (ref 8.9–10.3)
Chloride: 110 mmol/L (ref 98–111)
Creatinine, Ser: 1.56 mg/dL — ABNORMAL HIGH (ref 0.61–1.24)
GFR, Estimated: 46 mL/min — ABNORMAL LOW (ref >60)
Glucose, Bld: 189 mg/dL — ABNORMAL HIGH (ref 70–99)
Potassium: 3.5 mmol/L (ref 3.5–5.1)
Sodium: 143 mmol/L (ref 135–145)

## 2020-04-17 MED ORDER — CARVEDILOL 6.25 MG PO TABS
6.2500 mg | ORAL_TABLET | Freq: Two times a day (BID) | ORAL | Status: DC
Start: 2020-04-17 — End: 2020-04-20
  Administered 2020-04-17 – 2020-04-19 (×5): 6.25 mg via ORAL
  Filled 2020-04-17 (×5): qty 1

## 2020-04-17 NOTE — Progress Notes (Signed)
Physical Therapy Treatment Patient Details Name: Jordan [name] MRN: [id number] DOB: 1945-06-23 Today's Date: 04/17/2020    History of Present Illness Pt is a 74 year old man admitted on 04/07/20 after being found down with AMS and complete heart block. Intubated 12/17-12/21. Brain MRI negative for acute changes. Pacemaker 04/10/20.PMH: HTN, CKD, CAD, DM, CVA.    PT Comments    Pt's lunch had just arrived and pt eager to sit up in recliner to eat. Pt continues to be limited in safe mobility by decreased awareness of safety and deficits, in presence of decreased strength and increased posterior lean causing decreased balance without outside support. Pt is mod A for bed mobility and modAx2 for transfers and ambulation to recliner. D/c plans remain appropriate at this time. PT will continue to follow acutely.    Follow Up Recommendations  SNF;Supervision/Assistance - 24 hour     Equipment Recommendations  Other (comment) (TBA)       Precautions / Restrictions Precautions Precautions: Fall;ICD/Pacemaker Restrictions Weight Bearing Restrictions: Yes LUE Weight Bearing: Non weight bearing    Mobility  Bed Mobility Overal bed mobility: Needs Assistance Bed Mobility: Supine to Sit     Supine to sit: Mod assist     General bed mobility comments: pt able to manage LE off bed, requires cuing for bringing trunk to upright while maintaining pacemaker precautions  Transfers Overall transfer level: Needs assistance Equipment used: 2 person hand held assist Transfers: Sit to/from Stand;Stand Pivot Transfers Sit to Stand: +2 physical assistance;Mod assist         General transfer comment: mod Ax 2 for standing and steadying, increased posterior lean initially  Ambulation/Gait Ambulation/Gait assistance: Mod assist;+2 physical assistance Gait Distance (Feet): 3 Feet Assistive device: 2 person hand held assist Gait Pattern/deviations: Step-to pattern;Decreased step length -  right;Decreased step length - left;Shuffle;Leaning posteriorly Gait velocity: slowed Gait velocity interpretation: <1.31 ft/sec, indicative of household ambulator General Gait Details: modAx2 for lateral stepping to recliner, support required for posterior lean, once up in recliner grateful to be set up for lunch         Balance Overall balance assessment: Needs assistance Sitting-balance support: Feet supported;Bilateral upper extremity supported Sitting balance-Leahy Scale: Zero Sitting balance - Comments: strong posterior lean   Standing balance support: Bilateral upper extremity supported;During functional activity Standing balance-Leahy Scale: Poor Standing balance comment: continues to have increase posterior lean                            Cognition Arousal/Alertness: Awake/alert Behavior During Therapy: Flat affect Overall Cognitive Status: Impaired/Different from baseline Area of Impairment: Attention;Memory;Following commands;Safety/judgement;Awareness;Problem solving                   Current Attention Level: Sustained Memory: Decreased short-term memory Following Commands: Follows one step commands inconsistently;Follows one step commands with increased time Safety/Judgement: Decreased awareness of safety;Decreased awareness of deficits Awareness: Emergent Problem Solving: Slow processing;Decreased initiation;Difficulty sequencing;Requires verbal cues;Requires tactile cues General Comments: decreased recall of pacemaker precautions         General Comments General comments (skin integrity, edema, etc.): VSS on RA      Pertinent Vitals/Pain Pain Assessment: Faces Faces Pain Scale: No hurt           PT Goals (current goals can now be found in the care plan section) Acute Rehab PT Goals Patient Stated Goal: return home PT Goal Formulation: With patient Time For Goal Achievement: 04/27/20  Potential to Achieve Goals: Good Progress towards  PT goals: Progressing toward goals    Frequency    Min 2X/week      PT Plan Current plan remains appropriate       AM-PAC PT "6 Clicks" Mobility   Outcome Measure  Help needed turning from your back to your side while in a flat bed without using bedrails?: Total Help needed moving from lying on your back to sitting on the side of a flat bed without using bedrails?: Total Help needed moving to and from a bed to a chair (including a wheelchair)?: Total Help needed standing up from a chair using your arms (e.g., wheelchair or bedside chair)?: Total Help needed to walk in hospital room?: Total Help needed climbing 3-5 steps with a railing? : Total 6 Click Score: 6    End of Session Equipment Utilized During Treatment: Gait belt Activity Tolerance: Patient tolerated treatment well Patient left: in chair;with call bell/phone within reach;with chair alarm set;Other (comment) (lunch present and pt eager to eat) Nurse Communication: Mobility status PT Visit Diagnosis: Unsteadiness on feet (R26.81)     Time: 6962-9528 PT Time Calculation (min) (ACUTE ONLY): 14 min  Charges:  $Therapeutic Activity: 8-22 mins                     Foye Damron B. Beverely Risen PT, DPT Acute Rehabilitation Services Pager [phone] Office [phone]    Elon Alas Fleet 04/17/2020, 1:07 PM

## 2020-04-17 NOTE — Progress Notes (Signed)
PROGRESS NOTE    DARALD [name]  DEY:[id number] DOB: 07/27/1945 DOA: 04/07/2020 PCP: Sherrie Mustache, MD    Brief Narrative:  Jordan [name] is a 74 yo M w/ PMH of Dm, HTN, CKD, CAD presenting to [facility] after being found down. Found to be altered. Intubated for altered mental status. He was brought in as code stroke due to left neglect but was found to be bradycardic with HR in 30s. He was given atropine, cardiology was consulted. He was emergently intubated. He was also found to be in HHS with cbg >500.He was treated with insulin drip, transitioned off drip on 12/19. Extubated on 12/21/ 21.  MRI brain negative for acute CVA, had past CVA.  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  Transferred to [facility] on 12/23.   Assessment & Plan:   Active Problems:   Hyperglycemic crisis due to diabetes mellitus (HCC)   Heart block AV complete (HCC)   New onset type 2 diabetes mellitus (HCC)   AKI (acute kidney injury) (HCC)   Acute on chronic respiratory failure with hypoxia (HCC)   Pressure injury of skin   Malnutrition of moderate degree   New onset Diabetes Mellitus type 2, with HHS - A1c 11.9 -overall blood sugars are improved -currently on lantus and SSI -continue to follow blood sugars -only on 10 units of basal insulin daily -Basal insulin discontinued and started on glimepiride -Follow-up blood sugars have been stable  Complete heart block -s/p PPM on 12/20 -follow up in EP clinic  Hypertension -Restarted on Coreg since blood pressures are elevated  AKI on CKD 3b -baseline creatinine around 1.7 -admission creatinine 3.06 -with IV hydration, creatinine had improved to 1.4 -Losartan currently on hold  Hypernatremia -Likely related to free water deficit -Improved with hypotonic fluids  Coronary artery disease -No chest pain at this time -Cardiac cath in 01/2020 with nonobstructive disease -Continue statin and aspirin  Metabolic encephalopathy -Suspect related to severe  hyperglycemia/dehydration -MRI brain negative for acute CVA -Overall mental status has improved with metabolic correction  Generalized weakness and deconditioning -Seen by physical therapy with recommendations for skilled nursing facility placement   DVT prophylaxis: heparin injection 5,000 Units Start: 04/13/20 2200 Place and maintain sequential compression device Start: 04/11/20 0654 SCDs Start: 04/07/20 1219  Code Status: full code Family Communication: updated sister Deloris over the phone 12/27 Disposition Plan: Status is: Inpatient  Remains inpatient appropriate because:Persistent severe electrolyte disturbances, Unsafe d/c plan and IV treatments appropriate due to intensity of illness or inability to take PO   Dispo: The patient is from: Home              Anticipated d/c is to: SNF              Anticipated d/c date is: 1 day              Patient currently is medically stable to d/c.    Consultants:     Procedures:   12/20 PPM placement  12/17 temporary transvenous pacer   Antimicrobials:       Subjective: No complaints today.  No chest pain or shortness of breath.  Objective: Vitals:   04/17/20 0318 04/17/20 0746 04/17/20 1127 04/17/20 1128  BP: (!) 158/68 131/64  123/66  Pulse: 72 74  76  Resp: 16 20  15   Temp: 98.6 F (37 C) 98.8 F (37.1 C) 97.7 F (36.5 C)   TempSrc: Oral Oral Oral Oral  SpO2: 97% 99% 92% 90%  Weight:  Height:        Intake/Output Summary (Last 24 hours) at 04/17/2020 1151 Last data filed at 04/17/2020 0323 Gross per 24 hour  Intake 600 ml  Output 1375 ml  Net -775 ml   Filed Weights   04/08/20 0300 04/09/20 0635 04/10/20 0510  Weight: 74.8 kg 74.8 kg 79.8 kg    Examination:  General exam: Alert, awake, oriented x 3 Respiratory system: Clear to auscultation. Respiratory effort normal. Cardiovascular system:RRR. No murmurs, rubs, gallops. Gastrointestinal system: Abdomen is nondistended, soft and nontender.  No organomegaly or masses felt. Normal bowel sounds heard. Central nervous system: Alert and oriented. No focal neurological deficits. Extremities: No C/C/E, +pedal pulses Skin: No rashes, lesions or ulcers Psychiatry: Judgement and insight appear normal. Mood & affect appropriate.     Data Reviewed: I have personally reviewed following labs and imaging studies  CBC: Recent Labs  Lab 04/11/20 0357 04/12/20 0504 04/14/20 0053 04/15/20 0043 04/16/20 0445  WBC 13.3* 11.3* 10.6* 13.0* 11.5*  HGB 10.2* 10.8* 11.3* 10.7* 10.9*  HCT 33.2* 34.1* 36.6* 33.5* 35.1*  MCV 94.6 93.2 93.6 91.8 93.9  PLT 144* 183 212 242 259   Basic Metabolic Panel: Recent Labs  Lab 04/12/20 0504 04/14/20 0053 04/15/20 0043 04/16/20 0445 04/17/20 0013  NA 148* 150* 149* 146* 143  K 3.7 3.7 3.8 3.4* 3.5  CL 115* 118* 116* 115* 110  CO2 22 23 25 23 24   GLUCOSE 197* 215* 201* 121* 189*  BUN 30* 24* 21 18 16   CREATININE 1.85* 1.63* 1.47* 1.46* 1.56*  CALCIUM 8.6* 8.6* 8.5* 8.2* 8.2*  MG 2.7* 2.8*  --  2.5*  --   PHOS  --   --   --  2.4*  --    GFR: Estimated Creatinine Clearance: 44.2 mL/min (A) (by C-G formula based on SCr of 1.56 mg/dL (H)). Liver Function Tests: Recent Labs  Lab 04/12/20 0504 04/16/20 0445  AST 47*  --   ALT 34  --   ALKPHOS 63  --   BILITOT 0.8  --   PROT 5.3*  --   ALBUMIN 1.6* 1.6*   No results for input(s): LIPASE, AMYLASE in the last 168 hours. No results for input(s): AMMONIA in the last 168 hours. Coagulation Profile: No results for input(s): INR, PROTIME in the last 168 hours. Cardiac Enzymes: Recent Labs  Lab 04/12/20 0504  CKTOTAL 469*   BNP (last 3 results) No results for input(s): PROBNP in the last 8760 hours. HbA1C: No results for input(s): HGBA1C in the last 72 hours. CBG: Recent Labs  Lab 04/16/20 1639 04/16/20 1658 04/16/20 1719 04/16/20 2052 04/17/20 0619  GLUCAP 58* 59* 86 232* 148*   Lipid Profile: No results for input(s): CHOL,  HDL, LDLCALC, TRIG, CHOLHDL, LDLDIRECT in the last 72 hours. Thyroid Function Tests: No results for input(s): TSH, T4TOTAL, FREET4, T3FREE, THYROIDAB in the last 72 hours. Anemia Panel: No results for input(s): VITAMINB12, FOLATE, FERRITIN, TIBC, IRON, RETICCTPCT in the last 72 hours. Sepsis Labs: No results for input(s): PROCALCITON, LATICACIDVEN in the last 168 hours.  Recent Results (from the past 240 hour(s))  MRSA PCR Screening     Status: Abnormal   Collection Time: 04/07/20 10:37 PM   Specimen: Nasopharyngeal  Result Value Ref Range Status   MRSA by PCR POSITIVE (A) NEGATIVE Final    Comment:        The GeneXpert MRSA Assay (FDA approved for NASAL specimens only), is one component of a comprehensive MRSA colonization  surveillance program. It is not intended to diagnose MRSA infection nor to guide or monitor treatment for MRSA infections. RESULT CALLED TO, READ BACK BY AND VERIFIED WITH: B NGUYEN RN 04/08/20 5638 JDW Performed at [facility], [address], [place], [place] [address]          Radiology Studies: No results found.      Scheduled Meds: . (feeding supplement) PROSource Plus  30 mL Oral BID BM  . aspirin  81 mg Oral Daily  . carvedilol  6.25 mg Oral BID WC  . Chlorhexidine Gluconate Cloth  6 each Topical Daily  . docusate  100 mg Oral BID  . feeding supplement (GLUCERNA SHAKE)  237 mL Oral TID BM  . glimepiride  1 mg Oral Q breakfast  . heparin injection (subcutaneous)  5,000 Units Subcutaneous Q8H  . insulin aspart  0-9 Units Subcutaneous TID WC  . mouth rinse  15 mL Mouth Rinse BID  . pantoprazole  40 mg Oral Daily  . polyethylene glycol  17 g Oral Daily  . rosuvastatin  20 mg Oral Daily  . sodium chloride flush  10-40 mL Intracatheter Q12H   Continuous Infusions:    LOS: 10 days    Time spent:    Erick Blinks, MD [facility]   If 7PM-7AM, please contact night-coverage [email or web address]  04/17/2020,  11:51 AM

## 2020-04-17 NOTE — Plan of Care (Signed)

## 2020-04-18 LAB — CBC WITH DIFFERENTIAL/PLATELET
Abs Immature Granulocytes: 0.19 10*3/uL — ABNORMAL HIGH (ref 0.00–0.07)
Basophils Absolute: 0.1 10*3/uL (ref 0.0–0.1)
Basophils Relative: 1 %
Eosinophils Absolute: 0.3 10*3/uL (ref 0.0–0.5)
Eosinophils Relative: 3 %
HCT: 32.7 % — ABNORMAL LOW (ref 39.0–52.0)
Hemoglobin: 10.3 g/dL — ABNORMAL LOW (ref 13.0–17.0)
Immature Granulocytes: 2 %
Lymphocytes Relative: 24 %
Lymphs Abs: 2.5 10*3/uL (ref 0.7–4.0)
MCH: 29.3 pg (ref 26.0–34.0)
MCHC: 31.5 g/dL (ref 30.0–36.0)
MCV: 93.2 fL (ref 80.0–100.0)
Monocytes Absolute: 0.8 10*3/uL (ref 0.1–1.0)
Monocytes Relative: 8 %
Neutro Abs: 6.7 10*3/uL (ref 1.7–7.7)
Neutrophils Relative %: 62 %
Platelets: 255 10*3/uL (ref 150–400)
RBC: 3.51 MIL/uL — ABNORMAL LOW (ref 4.22–5.81)
RDW: 13.8 % (ref 11.5–15.5)
WBC: 10.6 10*3/uL — ABNORMAL HIGH (ref 4.0–10.5)
nRBC: 0.4 % — ABNORMAL HIGH (ref 0.0–0.2)

## 2020-04-18 LAB — GLUCOSE, CAPILLARY
Glucose-Capillary: 160 mg/dL — ABNORMAL HIGH (ref 70–99)
Glucose-Capillary: 170 mg/dL — ABNORMAL HIGH (ref 70–99)
Glucose-Capillary: 175 mg/dL — ABNORMAL HIGH (ref 70–99)
Glucose-Capillary: 187 mg/dL — ABNORMAL HIGH (ref 70–99)

## 2020-04-18 LAB — BASIC METABOLIC PANEL
Anion gap: 8 (ref 5–15)
BUN: 10 mg/dL (ref 8–23)
CO2: 23 mmol/L (ref 22–32)
Calcium: 7.9 mg/dL — ABNORMAL LOW (ref 8.9–10.3)
Chloride: 109 mmol/L (ref 98–111)
Creatinine, Ser: 1.37 mg/dL — ABNORMAL HIGH (ref 0.61–1.24)
GFR, Estimated: 54 mL/min — ABNORMAL LOW (ref >60)
Glucose, Bld: 192 mg/dL — ABNORMAL HIGH (ref 70–99)
Potassium: 3.7 mmol/L (ref 3.5–5.1)
Sodium: 140 mmol/L (ref 135–145)

## 2020-04-18 LAB — MAGNESIUM: Magnesium: 2.3 mg/dL (ref 1.7–2.4)

## 2020-04-18 LAB — SARS CORONAVIRUS 2 (TAT 6-24 HRS): SARS Coronavirus 2: NEGATIVE

## 2020-04-18 LAB — PHOSPHORUS: Phosphorus: 2.9 mg/dL (ref 2.5–4.6)

## 2020-04-18 MED ORDER — GLIMEPIRIDE 2 MG PO TABS
2.0000 mg | ORAL_TABLET | Freq: Every day | ORAL | Status: DC
  Administered 2020-04-19: 06:00:00 2 mg via ORAL
  Filled 2020-04-18 (×2): qty 1

## 2020-04-18 MED ORDER — GLIMEPIRIDE 1 MG PO TABS
1.0000 mg | ORAL_TABLET | Freq: Once | ORAL | Status: AC
  Administered 2020-04-18: 10:00:00 1 mg via ORAL
  Filled 2020-04-18: qty 1

## 2020-04-18 NOTE — Progress Notes (Addendum)
Jordan [name] is reviewing. CSW called and left message with sister requesting call back and vaccination status.   1230: CSW spoke with pt sister and gave SNF offers. SIster explained [name] is first choice and [facility] [name] is 2nd choice.   1401: Jordan [name] accepted in hub. Left message informing Jordan [name] that pt is d/cing tomorrow. Auth started. [id number]. Clinicals faxed to [facility].

## 2020-04-18 NOTE — Progress Notes (Signed)
Auth approved. Ref# [id number] Approved 04/19/20 -- 04/22/20

## 2020-04-18 NOTE — Progress Notes (Signed)
Occupational Therapy Treatment Patient Details Name: Jordan [name] MRN: [id number] DOB: April 11, 1946 Today's Date: 04/18/2020    History of present illness Pt is a 74 year old man admitted on 04/07/20 after being found down with AMS and complete heart block. Intubated 12/17-12/21. Brain MRI negative for acute changes. Pacemaker 04/10/20.PMH: HTN, CKD, CAD, DM, CVA.   OT comments  Pt making steady progress towards OT goals this session. Pt continues to present with impaired sitting balance, cognitive deficits, impaired motor planning and decreased activity tolerance impacting pts ability to complete BADLs independently. Pt currently requires total A for LB ADLs and supervision for UB ADLs from chair although pt requires MAX cues to initiate and terminate grooming tasks. Pt requires MIN - MOD A +2 for simulated toilet transfer from EOB>recliner with HHA. Pt would continue to benefit from skilled occupational therapy while admitted and after d/c to address the below listed limitations in order to improve overall functional mobility and facilitate independence with BADL participation. DC plan remains appropriate, will follow acutely per POC.     Follow Up Recommendations  SNF;Supervision/Assistance - 24 hour    Equipment Recommendations  3 in 1 bedside commode    Recommendations for Other Services      Precautions / Restrictions Precautions Precautions: Fall;ICD/Pacemaker Restrictions Weight Bearing Restrictions: No Other Position/Activity Restrictions: pacemaker 12/20       Mobility Bed Mobility Overal bed mobility: Needs Assistance Bed Mobility: Rolling;Sidelying to Sit Rolling: Min assist Sidelying to sit: Mod assist;+2 for safety/equipment       General bed mobility comments: pt required multimodal cues to sequence all aspects of bed mobility, pt initially able to roll to R side of bed with MIN A but then impulsively returns self to supine, MOD A +2 to assist pt with  transitioning from supine >sidelying and to elevate trunk into sitting  Transfers Overall transfer level: Needs assistance Equipment used: 2 person hand held assist Transfers: Sit to/from [employer] Sit to Stand: +2 physical assistance;Mod assist Stand pivot transfers: Min assist;+2 physical assistance       General transfer comment: pt required MOD A +2 to power into standing from EOB needing cues for hand placement and intial steadying assist, MIN A +2 to pivot to recliner needing cues to initiate pivotal steps    Balance Overall balance assessment: Needs assistance Sitting-balance support: Feet supported;Bilateral upper extremity supported Sitting balance-Leahy Scale: Poor Sitting balance - Comments: pt with strong posterior lean in sitting needing up to MOD A for sitting balance   Standing balance support: Bilateral upper extremity supported;During functional activity Standing balance-Leahy Scale: Poor Standing balance comment: required BUE support during mobility                           ADL either performed or assessed with clinical judgement   ADL Overall ADL's : Needs assistance/impaired     Grooming: Oral care;Sitting;Supervision/safety;Set up;Cueing for sequencing;Cueing for safety Grooming Details (indicate cue type and reason): cues to initiate task and terminate task     Lower Body Bathing: Total assistance;Sit to/from stand Lower Body Bathing Details (indicate cue type and reason): simulated via LB dressing     Lower Body Dressing: Total assistance;Sitting/lateral leans Lower Body Dressing Details (indicate cue type and reason): total A to don socks from EOB despite cues and physical assist to maintain figure four positioning Toilet Transfer: Minimal assistance;Moderate assistance;+2 for physical assistance;+2 for safety/equipment Toilet Transfer Details (indicate cue type  and reason): simulated via stand pivot transfer from  EOB>recliner, MOD A +2 to power into standing MIN A +2 to pivot to recliner with HHA         Functional mobility during ADLs: Minimal assistance;Moderate assistance;+2 for physical assistance;+2 for safety/equipment General ADL Comments: pt continues to present with impaired sitting balance, cognitive deficits, impaired motor planning and decreased activity tolerance     Vision       Perception     Praxis      Cognition Arousal/Alertness: Awake/alert Behavior During Therapy: Flat affect Overall Cognitive Status: Impaired/Different from baseline Area of Impairment: Attention;Memory;Following commands;Safety/judgement;Awareness;Problem solving;Orientation                 Orientation Level: Disoriented to;Time (disoriented to day of week) Current Attention Level: Sustained Memory: Decreased short-term memory (unable to recall 3 words shared at start of session at end of session) Following Commands: Follows one step commands inconsistently;Follows one step commands with increased time Safety/Judgement: Decreased awareness of safety;Decreased awareness of deficits Awareness: Intellectual Problem Solving: Slow processing;Decreased initiation;Difficulty sequencing;Requires verbal cues;Requires tactile cues General Comments: pt slow to process and requried increased time to follow all commands, pt requried cues to initiate and terminate all ADL tasks, noted to have STM deficits as pt unable to recall 3 words stated at start of session or recall correct day of week stated at start of sesson, pt continues to present with impaired safety awareness as pt reports he feels like he can return home alone, however was agreeable to this OTA recommending SNF after explaination        Exercises Other Exercises Other Exercises: ankle pumps x5 from supine Other Exercises: straight leg raise from supine x5   Shoulder Instructions       General Comments VSS on RA    Pertinent Vitals/  Pain       Faces Pain Scale: No hurt  Home Living                                          Prior Functioning/Environment              Frequency  Min 2X/week        Progress Toward Goals  OT Goals(current goals can now be found in the care plan section)  Progress towards OT goals: Progressing toward goals  Acute Rehab OT Goals Patient Stated Goal: return home OT Goal Formulation: With patient Time For Goal Achievement: 04/27/20 Potential to Achieve Goals: Fair  Plan Discharge plan remains appropriate;Frequency remains appropriate    Co-evaluation                 AM-PAC OT "6 Clicks" Daily Activity     Outcome Measure   Help from another person eating meals?: A Little Help from another person taking care of personal grooming?: A Lot Help from another person toileting, which includes using toliet, bedpan, or urinal?: A Lot Help from another person bathing (including washing, rinsing, drying)?: A Lot Help from another person to put on and taking off regular upper body clothing?: A Lot Help from another person to put on and taking off regular lower body clothing?: Total 6 Click Score: 12    End of Session Equipment Utilized During Treatment: Gait belt  OT Visit Diagnosis: Unsteadiness on feet (R26.81);Other abnormalities of gait and mobility (R26.89);Muscle weakness (generalized) (M62.81);Other symptoms and signs involving  cognitive function   Activity Tolerance Patient tolerated treatment well   Patient Left in chair;with call bell/phone within reach;with chair alarm set   Nurse Communication Mobility status;Other (comment) (+2 for safety back to bed)        Time: 7408-1448 OT Time Calculation (min): 19 min  Charges: OT General Charges $OT Visit: 1 Visit OT Treatments $Self Care/Home Management : 8-22 mins  Lenor Derrick., COTA/L Acute Rehabilitation Services [phone]    Barron Schmid 04/18/2020, 3:27  PM

## 2020-04-18 NOTE — Progress Notes (Signed)
PROGRESS NOTE  Jordan [name]  DOB: 07-Jan-1946  PCP: Sherrie Mustache, MD JAS:[id number]  DOA: 04/07/2020  LOS: 11 days   Chief Complaint  Patient presents with  . Fall   Brief narrative: Jordan [name] is a 74 y.o. male with PMH significant for DM2, HTN, CKD, CAD. Patient was brought to the ED as code stroke on 12/17 after he was found altered on the ground with left hemineglect.  In the ED, patient was noted to be bradycardic to 30s. Emergently intubated in the ED for airway protection. Labs suggested hyperglycemic hyperosmolar state with cbg >500. He was treated with insulin drip, transitioned off drip on 12/19. Extubated on 12/21. MRI brain negative for acute CVA, had past CVA.  Underwent permanent pacemaker placement on 12/20.  Transferred to [facility] on 12/23.  Subjective: Patient was seen and examined this morning. Pleasant elderly African-American male.  Not in distress.  Not on supplemental oxygen. Chart reviewed.  Afebrile, hemodynamically stable, breathing room air Labs from this morning with creatinine improving to 1.37, WBC count at 10.6, hemoglobin 10.3  Assessment/Plan: Hyperglycemia hyperosmolar state Type 2 diabetes mellitus -A1c 11.9 on 12/17. -Initially started on insulin drip per protocol and later switched to subcutaneous insulin. Patient was getting Lantus which was also discontinued, last dose 12/24.  Currently on glimepiride 1 mg daily only. -Blood sugar level this morning 160.  We will increase glimepiride dose to 2 mg daily. -Continue Accu-Cheks. Recent Labs  Lab 04/17/20 1245 04/17/20 1637 04/17/20 2044 04/18/20 0606 04/18/20 1129  GLUCAP 248* 134* 179* 160* 170*   Complete heart block -s/p PPM on 12/20 -follow up in EP clinic  Hypertension -Home meds include Coreg 12.5 mg twice daily, HCTZ 25 mg daily, losartan 100 mg daily, triamterene/HCTZ 37.5/25 mg daily. -Currently blood pressure controlled on Coreg 6.25 mg twice daily only. -Continue to  monitor.  AKI on CKD 3b -Baseline creatinine around 1.7.  Creatinine was elevated to 3.06 on admission.  Improved to baseline with IV fluid. Continue to monitor  Acute hypernatremia -Sodium level was elevated to peak at 150.  Improved back to normal with IV fluid. Recent Labs  Lab 04/12/20 0504 04/14/20 0053 04/15/20 0043 04/16/20 0445 04/17/20 0013 04/18/20 1023  NA 148* 150* 149* 146* 143 140   History of coronary artery disease  -Home meds include aspirin 81 mg daily, Plavix 75 mg daily, Crestor 20 mg daily. -Continue the same.  Metabolic encephalopathy -Suspect related to severe hyperglycemia/dehydration -MRI brain negative for acute CVA -Overall mental status has improved with metabolic correction  Generalized weakness and deconditioning -Seen by physical therapy with recommendations for skilled nursing facility placement  Mobility: PT eval obtained. Code Status:   Code Status: Full Code  Nutritional status: Body mass index is 24.54 kg/m. Nutrition Problem: Moderate Malnutrition Etiology: social / environmental circumstances Signs/Symptoms: moderate fat depletion,moderate muscle depletion Diet Order            DIET DYS 2 Room service appropriate? Yes; Fluid consistency: Thin  Diet effective now                 DVT prophylaxis: heparin injection 5,000 Units Start: 04/13/20 2200 Place and maintain sequential compression device Start: 04/11/20 0654 SCDs Start: 04/07/20 1219   Antimicrobials:  None Fluid: None Consultants: None Family Communication:  None at bedside  Status is: Inpatient  Remains inpatient appropriate because: Pending SNF placement  Dispo: The patient is from: Home  Anticipated d/c is to: SNF              Anticipated d/c date is: Pending SNF              Patient currently is medically stable to d/c.       Infusions:    Scheduled Meds: . (feeding supplement) PROSource Plus  30 mL Oral BID BM  . aspirin  81 mg  Oral Daily  . carvedilol  6.25 mg Oral BID WC  . docusate  100 mg Oral BID  . feeding supplement (GLUCERNA SHAKE)  237 mL Oral TID BM  . [START ON 04/19/2020] glimepiride  2 mg Oral Q breakfast  . heparin injection (subcutaneous)  5,000 Units Subcutaneous Q8H  . insulin aspart  0-9 Units Subcutaneous TID WC  . mouth rinse  15 mL Mouth Rinse BID  . pantoprazole  40 mg Oral Daily  . polyethylene glycol  17 g Oral Daily  . rosuvastatin  20 mg Oral Daily  . sodium chloride flush  10-40 mL Intracatheter Q12H    Antimicrobials: Anti-infectives (From admission, onward)   Start     Dose/Rate Route Frequency Ordered Stop   04/10/20 1400  ceFAZolin (ANCEF) IVPB 1 g/50 mL premix        1 g 100 mL/hr over 30 Minutes Intravenous Every 6 hours 04/10/20 1052 04/11/20 0156   04/10/20 1145  gentamicin (GARAMYCIN) 80 mg in sodium chloride 0.9 % 500 mL irrigation  Status:  Discontinued        80 mg Irrigation On call 04/10/20 1051 04/10/20 1112   04/10/20 1145  ceFAZolin (ANCEF) IVPB 2g/100 mL premix  Status:  Discontinued        2 g 200 mL/hr over 30 Minutes Intravenous On call 04/10/20 1051 04/10/20 1112   04/10/20 0848  gentamicin (GARAMYCIN) 80 mg in sodium chloride 0.9 % 500 mL irrigation  Status:  Discontinued          As needed 04/10/20 0848 04/10/20 0919   04/10/20 0817  ceFAZolin (ANCEF) IVPB 2 g/50 mL premix        over 30 Minutes  Continuous PRN 04/10/20 0817 04/10/20 0817      PRN meds: acetaminophen, dextrose, hydrALAZINE, ondansetron (ZOFRAN) IV, sodium chloride flush   Objective: Vitals:   04/18/20 0920 04/18/20 1132  BP: (!) 134/56 (!) 127/52  Pulse: 70 72  Resp: 19 17  Temp: 98 F (36.7 C) 98.4 F (36.9 C)  SpO2: 98% 97%    Intake/Output Summary (Last 24 hours) at 04/18/2020 1309 Last data filed at 04/18/2020 0310 Gross per 24 hour  Intake 240 ml  Output 525 ml  Net -285 ml   Filed Weights   04/08/20 0300 04/09/20 0635 04/10/20 0510  Weight: 74.8 kg 74.8 kg 79.8  kg   Weight change:  Body mass index is 24.54 kg/m.   Physical Exam: General exam: Pleasant, elderly African-American male.  Not in physical distress Skin: No rashes, lesions or ulcers. HEENT: Atraumatic, normocephalic, no obvious bleeding Lungs: Clear to auscultation bilaterally CVS: Regular rate and rhythm, no murmur GI/Abd soft, nontender, nondistended, bowel sound present CNS: Alert, awake, oriented x3 Psychiatry: Mood appropriate Extremities: No pedal edema, no calf tenderness  Data Review: I have personally reviewed the laboratory data and studies available.  Recent Labs  Lab 04/12/20 0504 04/14/20 0053 04/15/20 0043 04/16/20 0445 04/18/20 1115  WBC 11.3* 10.6* 13.0* 11.5* 10.6*  NEUTROABS  --   --   --   --  6.7  HGB 10.8* 11.3* 10.7* 10.9* 10.3*  HCT 34.1* 36.6* 33.5* 35.1* 32.7*  MCV 93.2 93.6 91.8 93.9 93.2  PLT 183 212 242 259 255   Recent Labs  Lab 04/12/20 0504 04/14/20 0053 04/15/20 0043 04/16/20 0445 04/17/20 0013 04/18/20 1023  NA 148* 150* 149* 146* 143 140  K 3.7 3.7 3.8 3.4* 3.5 3.7  CL 115* 118* 116* 115* 110 109  CO2 22 23 25 23 24 23   GLUCOSE 197* 215* 201* 121* 189* 192*  BUN 30* 24* 21 18 16 10   CREATININE 1.85* 1.63* 1.47* 1.46* 1.56* 1.37*  CALCIUM 8.6* 8.6* 8.5* 8.2* 8.2* 7.9*  MG 2.7* 2.8*  --  2.5*  --  2.3  PHOS  --   --   --  2.4*  --  2.9    F/u labs ordered  Signed, , MD [facility] 04/18/2020

## 2020-04-18 NOTE — Progress Notes (Signed)
  Speech Language Pathology Treatment: Dysphagia  Patient Details Name: Jordan [name] MRN: [id number] DOB: [birth year] Today's Date: 04/18/2020 Time: 7322-0254 SLP Time Calculation (min) (ACUTE ONLY): 9 min  Assessment / Plan / Recommendation Clinical Impression  Pt describes his baseline diet to be most consistent with mechanical soft. SLP provided advanced trials of soft solids with consistent coughing noted as pt is still trying to masticate the food in his mouth. Suspect that he may have premature spillage of small particles and/or reduced oral control during mastication, as he does not have any overt s/s of aspiration with thin liquids and he denies any with current diet. Will continue current chopped diet for now with consideration for MBS if coughing persists with advanced trials.    HPI HPI: Pt is a 74 yo male found down at home and found to be in complete heart block s/p pacemaker placement 12/20. Pt also in HHS with new onset diabetes. ETT 12/17-12/21. PMH includes: GERD, DM, HTN, CKD, CAD      SLP Plan  Continue with current plan of care       Recommendations  Diet recommendations: Dysphagia 2 (fine chop);Thin liquid Liquids provided via: Cup;Straw Medication Administration: Whole meds with puree Supervision: Patient able to self feed;Intermittent supervision to cue for compensatory strategies Compensations: Minimize environmental distractions;Slow rate;Small sips/bites Postural Changes and/or Swallow Maneuvers: Seated upright 90 degrees                Oral Care Recommendations: Oral care BID Follow up Recommendations: 24 hour supervision/assistance SLP Visit Diagnosis: Dysphagia, unspecified (R13.10) Plan: Continue with current plan of care       GO                Mahala Menghini., M.A. CCC-SLP Acute Rehabilitation Services Pager [phone] Office [phone]  04/18/2020, 11:59 AM

## 2020-04-18 NOTE — Plan of Care (Signed)

## 2020-04-19 LAB — GLUCOSE, CAPILLARY
Glucose-Capillary: 168 mg/dL — ABNORMAL HIGH (ref 70–99)
Glucose-Capillary: 163 mg/dL — ABNORMAL HIGH (ref 70–99)
Glucose-Capillary: 132 mg/dL — ABNORMAL HIGH (ref 70–99)
Glucose-Capillary: 160 mg/dL — ABNORMAL HIGH (ref 70–99)

## 2020-04-19 MED ORDER — INSULIN ASPART 100 UNIT/ML ~~LOC~~ SOLN: 0.0000 [IU] | Freq: Three times a day (TID) | SUBCUTANEOUS | 11 refills | Status: DC

## 2020-04-19 MED ORDER — CARVEDILOL 6.25 MG PO TABS: 6.2500 mg | ORAL_TABLET | Freq: Two times a day (BID) | ORAL | Status: DC

## 2020-04-19 MED ORDER — PROSOURCE PLUS PO LIQD: 30.0000 mL | Freq: Two times a day (BID) | ORAL | Status: DC

## 2020-04-19 MED ORDER — GLIMEPIRIDE 2 MG PO TABS: 2.0000 mg | ORAL_TABLET | Freq: Every day | ORAL | Status: DC

## 2020-04-19 MED ORDER — GLUCERNA SHAKE PO LIQD: 237.0000 mL | Freq: Three times a day (TID) | ORAL | 0 refills | Status: AC

## 2020-04-19 NOTE — TOC Transition Note (Signed)
Transition of Care [facility]) - CM/SW Discharge Note   Patient Details  Name: CAN LUCCI MRN: [id number] Date of Birth: [birth year]  Transition [facility]) CM/SW Contact:  Erin Sons, LCSW Phone Number: 04/19/2020, 3:12 PM   Clinical Narrative:     Patient will DC to: [facility] Anticipated DC date: 04/19/20 Family notified: Deloris Sturdevint (sister Transport by: Sharin Mons   Per MD patient ready for DC to [facility] SN[facility] . RN, patient, patient's family, and facility notified of DC. Discharge Summary and FL2 sent to facility. RN to call report prior to discharge [phone]). DC packet on chart. Ambulance transport requested for patient.   CSW will sign off for now as social work intervention is no longer needed. Please consult [place] again if new needs arise.   Final next level of care: Skilled Nursing Facility Barriers to Discharge: No Barriers Identified   Patient Goals and CMS Choice Patient states their goals for this hospitalization and ongoing recovery are:: Pt unable to participate in goal setting due to disorientation. Family agreeable to SNF placement.      Discharge Placement              Patient chooses bed at: [facility] Patient to be transferred to facility by: [facility] Name of family member notified: Deloris Sturdivent Patient and family notified of of transfer: 04/19/20  Discharge Plan and Services                                     Social Determinants of Health (SDOH) Interventions     Readmission Risk Interventions No flowsheet data found.

## 2020-04-19 NOTE — Progress Notes (Addendum)
Pt sister Delores called CSW and explained that her other sister is very upset with the choice of [facility] due to their mother dying there in the past. Delores requests change of facility to [facility]. CSW called and left message with [place].   [id number]: Received call from [facility] Admissions. They can accept pt today. Auth facility will need to be changed. CSW called sister Eather Colas and notified her. Delores will complete paper work at [employer].   1225: Auth facility change. New reference number is #[id number]: Auth received. Approved 04/19/20 -- 04/24/20

## 2020-04-19 NOTE — Discharge Summary (Addendum)
Physician Discharge Summary  [name]:[id number] DOB: 01-16-1946 DOA: 04/07/2020  PCP: Sherrie Mustache, MD  Admit date: 04/07/2020 Discharge date: 04/19/2020  Admitted From: home Discharge disposition: SNF   Recommendations for Outpatient Follow-Up:  Monitor blood sugars-- adjust as able Cbc/bmp PRN   Discharge Diagnosis:   Active Problems:   Hyperglycemic crisis due to diabetes mellitus (HCC)   Heart block AV complete (HCC)   New onset type 2 diabetes mellitus (HCC)   AKI (acute kidney injury) (HCC)   Acute on chronic respiratory failure with hypoxia (HCC)   Pressure injury of skin   Malnutrition of moderate degree    Discharge Condition: Improved.  Diet recommendation: Low sodium, heart healthy/carb mod  Wound care: None.  Code status: Full.   History of Present Illness:   [name] is a 74 yo M w/ PMH listed below presenting to [facility] after being found down at home. On arrival he was noted to be altered, minimally responsive with inability to follow commands. He is unable to provide history.  History obtained via chart review. EMS pushed emergency call from home around midnight. Patient was unable to be accessed by EMS. Police was called and he was found him at home down for unknown duration. He was brought in as code stroke due to left neglect but was found to be bradycardic with HR in 30s. He was given atropine, cardiology was consulted and intubated. He was also found to be in HHS with cbg >500. Insulin gtt started and PCCM consulted for admission.   Hospital Course by Problem:   Hyperglycemia hyperosmolar state Type 2 diabetes mellitus -A1c 11.9 on 12/17. -Initially started on insulin drip per protocol and later switched to subcutaneous insulin. Patient was getting Lantus which was also discontinued, last dose 12/24.  Currently on glimepiride 1 mg daily only. - increase glimepiride dose to 2 mg daily-- will need continued adjustments -Continue  Accu-Cheks.  Complete heart block -s/p PPM on 12/20 -follow up in EP clinic  Hypertension -Home meds include Coreg 12.5 mg twice daily, HCTZ 25 mg daily, losartan 100 mg daily, triamterene/HCTZ 37.5/25 mg daily. -Currently blood pressure controlled on Coreg 6.25 mg twice daily only. -Continue to monitor.  AKI on CKD 3b -Baseline creatinine around 1.7.  Creatinine was elevated to 3.06 on admission.  Improved to baseline with IV fluid.   Acute hypernatremia -Sodium level was elevated to peak at 150.  Improved back to normal with PO intake  History of coronary artery disease  -Home meds include aspirin 81 mg daily, Crestor 20 mg daily.  Metabolic encephalopathy -Suspect related to severe hyperglycemia/dehydration -MRI brain negative for acute CVA -Overall mental status has improved with metabolic correction  Generalized weakness and deconditioning -Seen by physical therapy with recommendations for skilled nursing facility placement  Nutrition Status: Nutrition Problem: Moderate Malnutrition Etiology: social / environmental circumstances Signs/Symptoms: moderate fat depletion,moderate muscle depletion Interventions: Glucerna shake,Prostat      Medical Consultants:   PCCM cards   Discharge Exam:   Vitals:   04/19/20 0400 04/19/20 0700  BP: (!) 149/79 (!) 141/65  Pulse: 72 68  Resp: 14 18  Temp: 98.3 F (36.8 C) 98.6 F (37 C)  SpO2: 98% 99%   Vitals:   04/18/20 1941 04/18/20 2329 04/19/20 0400 04/19/20 0700  BP: (!) 153/65 134/60 (!) 149/79 (!) 141/65  Pulse: 80 70 72 68  Resp: Temp: 98.4 F (36.9 C) 98.4 F (36.9 C) 98.3  F (36.8 C) 98.6 F (37 C)  TempSrc: Oral Oral Oral Oral  SpO2: 96% 94% 98% 99%  Weight:      Height:        General exam: Appears calm and comfortable.    The results of significant diagnostics from this hospitalization (including imaging, microbiology, ancillary and laboratory) are listed below for reference.      Procedures and Diagnostic Studies:   EEG  Result Date: 04/07/2020 Charlsie Quest, MD     04/07/2020  5:22 PM Patient Name: Jordan [name] MRN: [id number] Epilepsy Attending: Charlsie Quest Referring Physician/Provider: Dr Brooke Dare Date: 04/07/2020 Duration: 23.43 mins Patient history: 74yo M with ams. EEG to evaluate for seizure. Level of alertness:  comatose AEDs during EEG study: Propofol Technical aspects: This EEG study was done with scalp electrodes positioned according to the 10-20 International system of electrode placement. Electrical activity was acquired at a sampling rate of 500Hz  and reviewed with a high frequency filter of 70Hz  and a low frequency filter of 1Hz . EEG data were recorded continuously and digitally stored. Description: EEG showed continuous generalized 3 to 6 Hz theta-delta slowing. Hyperventilation and photic stimulation were not performed.   ABNORMALITY - Continuous slow, generalized IMPRESSION: This study is suggestive of moderate to severe diffuse encephalopathy. No seizures or epileptiform discharges were seen throughout the recording.   CT CERVICAL SPINE WO CONTRAST  Result Date: 04/07/2020 CLINICAL DATA:  74 year old male with a history of neurologic deficit EXAM: CT CERVICAL SPINE WITHOUT CONTRAST TECHNIQUE: Multidetector CT imaging of the cervical spine was performed without intravenous contrast. Multiplanar CT image reconstructions were also generated. COMPARISON:  None. FINDINGS: Alignment: Craniocervical junction aligned. Anatomic alignment of the cervical elements. No subluxation. Skull base and vertebrae: No acute fracture at the skullbase. Vertebral body heights relatively maintained. No acute fracture identified. Soft tissues and spinal canal: Unremarkable cervical soft tissues. Lymph nodes are present, though not enlarged. Disc levels: Disc space narrowing at C5-C6 and C6-C7 with endplate sclerosis, anterior osteophyte production,  and uncovertebral joint disease. Less degree of joint space narrowing at C4-C5. Mild right foraminal narrowing at C6-C7 secondary to uncovertebral joint disease. Ankylosis of the right greater than left facets at C2-C3. Left-sided facet ankylosis at C4-C5. No significant bony canal narrowing. Upper chest: Unremarkable appearance of the lung apices. Other: No bony canal narrowing. IMPRESSION: Negative for acute fracture or malalignment of the cervical spine. Degenerative changes as above. Electronically Signed   By: Charlsie Quest D.O.   On: 04/07/2020 09:41   MR ANGIO HEAD WO CONTRAST  Result Date: 04/07/2020 CLINICAL DATA:  Code stroke follow-up EXAM: MRI HEAD WITHOUT CONTRAST MRA HEAD WITHOUT CONTRAST TECHNIQUE: Multiplanar, multiecho pulse sequences of the brain and surrounding structures were obtained without intravenous contrast. Angiographic images of the head were obtained using MRA technique without contrast. COMPARISON:  None. FINDINGS: MRI HEAD Axial DWI and T2 FLAIR sequences were obtained. There is no acute infarction. Prominence of the ventricles and sulci reflects generalized parenchymal volume loss. 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 There is no hydrocephalus or extra-axial collection. MRA HEAD Intracranial internal carotid arteries are patent. Middle and anterior cerebral arteries are patent. Intracranial vertebral arteries, basilar artery, posterior cerebral arteries are patent. Bilateral posterior communicating arteries are present. There is no significant stenosis or aneurysm. IMPRESSION: No acute infarction. No proximal intracranial vessel occlusion.  Electronically Signed   By: Guadlupe Spanish M.D.   On: 04/07/2020 11:13   MR BRAIN WO CONTRAST  Result Date: 04/07/2020 CLINICAL DATA:  Code stroke follow-up EXAM: MRI HEAD WITHOUT CONTRAST MRA HEAD  WITHOUT CONTRAST TECHNIQUE: Multiplanar, multiecho pulse sequences of the brain and surrounding structures were obtained without intravenous contrast. Angiographic images of the head were obtained using MRA technique without contrast. COMPARISON:  None. FINDINGS: MRI HEAD Axial DWI and T2 FLAIR sequences were obtained. There is no acute infarction. Prominence of the ventricles and sulci reflects generalized parenchymal volume loss. 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 There is no hydrocephalus or extra-axial collection. MRA HEAD Intracranial internal carotid arteries are patent. Middle and anterior cerebral arteries are patent. Intracranial vertebral arteries, basilar artery, posterior cerebral arteries are patent. Bilateral posterior communicating arteries are present. There is no significant stenosis or aneurysm. IMPRESSION: No acute infarction. No proximal intracranial vessel occlusion. Electronically Signed   By: Guadlupe Spanish M.D.   On: 04/07/2020 11:13   CARDIAC CATHETERIZATION  Result Date: 04/07/2020 Successful placement of temporary transvenous pacemaker via right jugular access. Plan: check portable CXR for line placement. Will leave at rate 60 bpm, 5 mA output. Capture threshold < 0.1 mA  DG Chest Portable 1 View  Result Date: 04/07/2020 CLINICAL DATA:  Unresponsive.  Intubation. EXAM: PORTABLE CHEST 1 VIEW COMPARISON:  Chest x-ray dated December 30, 2019. FINDINGS: The patient is rotated to the right. Endotracheal tube tip approximately 1.9 cm above the carina. Enteric tube entering the stomach with the tip below the field of view. The heart size and mediastinal contours are within normal limits. Loop recorder again noted. No focal consolidation, pleural effusion, or pneumothorax. No acute osseous abnormality. IMPRESSION: 1. Appropriately positioned  endotracheal tube.  No active disease. Electronically Signed   By: Obie Dredge M.D.   On: 04/07/2020 12:17   DG Chest Port 1V same Day  Result Date: 04/07/2020 CLINICAL DATA:  Confirm pacer placement. EXAM: PORTABLE CHEST 1 VIEW COMPARISON:  April 07, 2020 FINDINGS: There is stable endotracheal tube and nasogastric tube positioning. Interval right internal jugular cardiac pacer lead placement is seen with appropriate lead tip positioning seen overlying the left lung base. The heart size and mediastinal contours are within normal limits. Both lungs are clear. The visualized skeletal structures are unremarkable. IMPRESSION: Interval right internal jugular cardiac pacer placement positioning, as described above, without evidence of acute or active cardiopulmonary disease. Electronically Signed   By: Aram Candela M.D.   On: 04/07/2020 19:20   DG Abd Portable 1 View  Result Date: 04/07/2020 CLINICAL DATA:  Unresponsive.  Enteric tube placement. EXAM: PORTABLE ABDOMEN - 1 VIEW COMPARISON:  None. FINDINGS: Enteric tube in the stomach. The bowel gas pattern is normal. No radio-opaque calculi or other significant radiographic abnormality are seen. No acute osseous abnormality. Bilateral hip osteoarthritis. IMPRESSION: 1. Enteric tube in the stomach. Electronically Signed   By: Obie Dredge M.D.   On: 04/07/2020 12:18   ECHOCARDIOGRAM COMPLETE  Result Date: 04/07/2020    ECHOCARDIOGRAM REPORT   Patient Name:   FRANK NOVELO Date of Exam: 04/07/2020 Medical Rec #:  [id number]    Height:       71.0 in Accession #:    [id number]   Weight:       167.1 lb Date of Birth:  December 22, 1945   BSA:  1.954 m Patient Age:    74 years     BP:           131/40 mmHg Patient Gender: M            HR:           36 bpm. Exam Location:  Inpatient Procedure: 2D Echo, Color Doppler, Cardiac Doppler and Intracardiac            Opacification Agent Indications:    Heart block  History:        Patient has prior history  of Echocardiogram examinations, most                 recent 10/27/2017. Risk Factors:Sleep Apnea, Former Smoker,                 Hypertension and Dyslipidemia. GERD.  Sonographer:    Ross Ludwig RDCS (AE) Referring Phys: [id number] [facility]'[place]  Sonographer Comments: Echo performed with patient supine and on artificial respirator. Limited patient mobility. IMPRESSIONS  1. Left ventricular ejection fraction, by estimation, is 60 to 65%. The left ventricle has normal function. The left ventricle has no regional wall motion abnormalities. There is mild concentric left ventricular hypertrophy. Left ventricular diastolic function could not be evaluated.  2. Right ventricular systolic function is normal. The right ventricular size is normal.  3. The mitral valve is grossly normal. Trivial mitral valve regurgitation.  4. The aortic valve is tricuspid. Aortic valve regurgitation is not visualized. No aortic stenosis is present. Conclusion(s)/Recommendation(s): In heart block throughout study, with ventricular rate in the 30s. Per EMR, teams are aware and managing heart block. FINDINGS  Left Ventricle: Left ventricular ejection fraction, by estimation, is 60 to 65%. The left ventricle has normal function. The left ventricle has no regional wall motion abnormalities. Definity contrast agent was given IV to delineate the left ventricular  endocardial borders. The left ventricular internal cavity size was normal in size. There is mild concentric left ventricular hypertrophy. Left ventricular diastolic function could not be evaluated. Right Ventricle: The right ventricular size is normal. Right vetricular wall thickness was not well visualized. Right ventricular systolic function is normal. Left Atrium: Left atrial size was normal in size. Right Atrium: Right atrial size was normal in size. Pericardium: There is no evidence of pericardial effusion. Mitral Valve: The mitral valve is grossly normal. There is mild thickening of  the mitral valve leaflet(s). There is mild calcification of the mitral valve leaflet(s). Trivial mitral valve regurgitation. Tricuspid Valve: The tricuspid valve is normal in structure. Tricuspid valve regurgitation is trivial. No evidence of tricuspid stenosis. Aortic Valve: The aortic valve is tricuspid. Aortic valve regurgitation is not visualized. No aortic stenosis is present. Pulmonic Valve: The pulmonic valve was grossly normal. Pulmonic valve regurgitation is trivial. No evidence of pulmonic stenosis. Aorta: The aortic root and ascending aorta are structurally normal, with no evidence of dilitation. Venous: IVC assessment for right atrial pressure unable to be performed due to mechanical ventilation. IAS/Shunts: The atrial septum is grossly normal.  LEFT VENTRICLE PLAX 2D LVIDd:         3.25 cm LVIDs:         2.25 cm LV PW:         1.45 cm LV IVS:        1.25 cm LVOT diam:     1.60 cm LVOT Area:     2.01 cm  RIGHT VENTRICLE RV Basal diam:  3.20 cm RV S  prime:     12.60 cm/s TAPSE (M-mode): 2.1 cm LEFT ATRIUM             Index       RIGHT ATRIUM           Index LA diam:        2.80 cm 1.43 cm/m  RA Area:     11.50 cm LA Vol (A2C):   47.5 ml 24.31 ml/m RA Volume:   22.20 ml  11.36 ml/m LA Vol (A4C):   27.5 ml 14.08 ml/m LA Biplane Vol: 36.5 ml 18.68 ml/m   AORTA Ao Root diam: 3.10 cm Ao Asc diam:  2.50 cm TRICUSPID VALVE TR Peak grad:   15.4 mmHg TR Vmax:        196.00 cm/s  SHUNTS Systemic Diam: 1.60 cm Jodelle Red MD Electronically signed by Jodelle Red MD Signature Date/Time: 04/07/2020/4:27:00 PM    Final    CT HEAD CODE STROKE WO CONTRAST  Result Date: 04/07/2020 CLINICAL DATA:  Code stroke.  Left-sided weakness EXAM: CT HEAD WITHOUT CONTRAST TECHNIQUE: Contiguous axial images were obtained from the base of the skull through the vertex without intravenous contrast. COMPARISON:  02/20/2019 FINDINGS: Brain: There is no acute intracranial hemorrhage, mass effect, or edema. No  new loss of gray-white differentiation. Multiple chronic infarcts are again identified within encephalomalacia/gliosis present in the parasagittal right frontal lobe, right parietal lobe, and left occipital lobe. There are also chronic small vessel infarcts of the left basal ganglia and adjacent white matter and left thalamus. Prominence of the ventricles and sulci reflects stable parenchymal volume loss. Additional patchy and confluent areas of hypoattenuation in the supratentorial white matter probably reflect stable chronic microvascular ischemic changes. Vascular: No hyperdense vessel. There is intracranial atherosclerotic calcification at the skull base. Skull: Unremarkable. Sinuses/Orbits: No acute abnormality. Other: Mastoid air cells are clear. IMPRESSION: No acute intracranial hemorrhage or evidence of acute infarction. Multiple chronic infarcts.  Chronic microvascular ischemic changes. These results were communicated to Dr. Iver Nestle at 9:38 am on 04/07/2020 by text page via the [facility] messaging system. Electronically Signed   By: Guadlupe Spanish M.D.   On: 04/07/2020 09:41     Labs:   Basic Metabolic Panel: Recent Labs  Lab 04/14/20 0053 04/15/20 0043 04/16/20 0445 04/17/20 0013 04/18/20 1023  NA 150* 149* 146* 143 140  K 3.7 3.8 3.4* 3.5 3.7  CL 118* 116* 115* 110 109  CO2 GLUCOSE 215* 201* 121* 189* 192*  BUN 24* CREATININE 1.63* 1.47* 1.46* 1.56* 1.37*  CALCIUM 8.6* 8.5* 8.2* 8.2* 7.9*  MG 2.8*  --  2.5*  --  2.3  PHOS  --   --  2.4*  --  2.9   GFR Estimated Creatinine Clearance: 50.4 mL/min (A) (by C-G formula based on SCr of 1.37 mg/dL (H)). Liver Function Tests: Recent Labs  Lab 04/16/20 0445  ALBUMIN 1.6*   No results for input(s): LIPASE, AMYLASE in the last 168 hours. No results for input(s): AMMONIA in the last 168 hours. Coagulation profile No results for input(s): INR, PROTIME in the last 168 hours.  CBC: Recent Labs  Lab  04/14/20 0053 04/15/20 0043 04/16/20 0445 04/18/20 1115  WBC 10.6* 13.0* 11.5* 10.6*  NEUTROABS  --   --   --  6.7  HGB 11.3* 10.7* 10.9* 10.3*  HCT 36.6* 33.5* 35.1* 32.7*  MCV 93.6 91.8 93.9 93.2  PLT 212 242 259 255  Cardiac Enzymes: No results for input(s): CKTOTAL, CKMB, CKMBINDEX, TROPONINI in the last 168 hours. BNP: Invalid input(s): POCBNP CBG: Recent Labs  Lab 04/18/20 0606 04/18/20 1129 04/18/20 1611 04/18/20 1959 04/19/20 0617  GLUCAP 160* 170* 175* 187* 168*   D-Dimer No results for input(s): DDIMER in the last 72 hours. Hgb A1c No results for input(s): HGBA1C in the last 72 hours. Lipid Profile No results for input(s): CHOL, HDL, LDLCALC, TRIG, CHOLHDL, LDLDIRECT in the last 72 hours. Thyroid function studies No results for input(s): TSH, T4TOTAL, T3FREE, THYROIDAB in the last 72 hours.  Invalid input(s): FREET3 Anemia work up No results for input(s): VITAMINB12, FOLATE, FERRITIN, TIBC, IRON, RETICCTPCT in the last 72 hours. Microbiology Recent Results (from the past 240 hour(s))  SARS CORONAVIRUS 2 (TAT 6-24 HRS) Nasopharyngeal Nasopharyngeal Swab     Status: None   Collection Time: 04/18/20  3:10 PM   Specimen: Nasopharyngeal Swab  Result Value Ref Range Status   SARS Coronavirus 2 NEGATIVE NEGATIVE Final    Comment: (NOTE) SARS-CoV-2 target nucleic acids are NOT DETECTED.  The SARS-CoV-2 RNA is generally detectable in upper and lower respiratory specimens during the acute phase of infection. Negative results do not preclude SARS-CoV-2 infection, do not rule out co-infections with other pathogens, and should not be used as the sole basis for treatment or other patient management decisions. Negative results must be combined with clinical observations, patient history, and epidemiological information. The expected result is Negative.  Fact Sheet for Patients: HairSlick.no  Fact Sheet for Healthcare  Providers: quierodirigir.com  This test is not yet approved or cleared by the [place] FDA and  has been authorized for detection and/or diagnosis of SARS-CoV-2 by FDA under an Emergency Use Authorization (EUA). This EUA will remain  in effect (meaning this test can be used) for the duration of the COVID-19 declaration under Se ction 564(b)(1) of the Act, 21 U.S.C. section 360bbb-3(b)(1), unless the authorization is terminated or revoked sooner.  Performed at [facility], [address], [place], [place] [address]      Discharge Instructions:   Discharge Instructions    Diet - low sodium heart healthy   Complete by: As directed    Diet Carb Modified   Complete by: As directed    Discharge wound care:   Complete by: As directed    Place folded piece of Xeroform in fissure of the intergluteal fold and secure with upside down sacral foam. Change daily or PRN soiling   Increase activity slowly   Complete by: As directed      Allergies as of 04/19/2020   No Known Allergies     Medication List    STOP taking these medications   clopidogrel 75 MG tablet Commonly known as: PLAVIX   hydrochlorothiazide 25 MG tablet Commonly known as: HYDRODIURIL   losartan 100 MG tablet Commonly known as: COZAAR   nitroGLYCERIN 0.4 MG SL tablet Commonly known as: NITROSTAT   sertraline 50 MG tablet Commonly known as: ZOLOFT   sildenafil 100 MG tablet Commonly known as: VIAGRA   triamterene-hydrochlorothiazide 37.5-25 MG tablet Commonly known as: MAXZIDE-25     TAKE these medications   (feeding supplement) PROSource Plus liquid Take 30 mLs by mouth 2 (two) times daily between meals.   feeding supplement (GLUCERNA SHAKE) Liqd Take 237 mLs by mouth 3 (three) times daily between meals.   acetaminophen 500 MG tablet Commonly known as: TYLENOL Take 1,000 mg by mouth daily as needed for headache (  pain).   albuterol 108 (90 Base) MCG/ACT  inhaler Commonly known as: VENTOLIN HFA Inhale 2 puffs into the lungs every 4 (four) hours as needed.   Apple Cider Vinegar Plus Tabs Take 1-2 tablets by mouth daily.   aspirin 81 MG chewable tablet Chew 81 mg by mouth daily.   carvedilol 6.25 MG tablet Commonly known as: COREG Take 1 tablet (6.25 mg total) by mouth 2 (two) times daily with a meal. What changed:   medication strength  how much to take  when to take this   Fish Oil 1200 MG Caps Take 1,200 mg by mouth daily.   GARLIC PO Take 1 tablet by mouth daily.   glimepiride 2 MG tablet Commonly known as: AMARYL Take 1 tablet (2 mg total) by mouth daily with breakfast. Start taking on: April 20, 2020   insulin aspart 100 UNIT/ML injection Commonly known as: novoLOG Inject 0-9 Units into the skin 3 (three) times daily with meals.   multivitamin with minerals Tabs tablet Take 1 tablet by mouth daily.   Red Yeast Rice 600 MG Tabs Take 600 mg by mouth daily.   rosuvastatin 20 MG tablet Commonly known as: CRESTOR Take 20 mg by mouth daily.   senna 8.6 MG Tabs tablet Commonly known as: SENOKOT Take 1 tablet by mouth daily as needed for mild constipation.   vitamin C 1000 MG tablet Take 1,000 mg by mouth daily.   Vitamin D3 1.25 MG (50000 UT) Caps Take 50,000 Units by mouth every Saturday.            Discharge Care Instructions  (From admission, onward)         Start     Ordered   04/19/20 0000  Discharge wound care:       Comments: Place folded piece of Xeroform in fissure of the intergluteal fold and secure with upside down sacral foam. Change daily or PRN soiling   04/19/20 0927          Follow-up Information    [facility] [address] [facility].   Specialty: Cardiology Why: 04/20/2020 @ 10:00AM, wound check visit Contact information: [address], [address] [place] [id number] [phone]       Lanier PrudeLambert, Cameron T, MD Follow up.   Specialties:  Cardiology, Radiology Why: 07/12/2020 @ 1:30PM Contact information: [address] [place] [id number] [phone]                Time coordinating discharge: 35 min  Signed:  Joseph ArtJessica U Tiffane Sheldon DO  [facility] 04/19/2020, 9:28 AM

## 2020-04-19 NOTE — Progress Notes (Signed)
Pt has been discharged to [facility], to be transported by [facility] today, could not give me a time for pick up, per patient request, I have contacted sister on patient's chart and left message advising that patient will be going to SNF

## 2020-04-20 ENCOUNTER — Encounter: Payer: Self-pay | Admitting: Internal Medicine

## 2020-04-20 ENCOUNTER — Non-Acute Institutional Stay (INDEPENDENT_AMBULATORY_CARE_PROVIDER_SITE_OTHER): Payer: Self-pay | Admitting: Internal Medicine

## 2020-04-20 DIAGNOSIS — I442 Atrioventricular block, complete: Secondary | ICD-10-CM

## 2020-04-20 DIAGNOSIS — I10 Essential (primary) hypertension: Secondary | ICD-10-CM

## 2020-04-20 DIAGNOSIS — N179 Acute kidney failure, unspecified: Secondary | ICD-10-CM

## 2020-04-20 DIAGNOSIS — E1165 Type 2 diabetes mellitus with hyperglycemia: Secondary | ICD-10-CM

## 2020-04-20 DIAGNOSIS — R4182 Altered mental status, unspecified: Secondary | ICD-10-CM

## 2020-04-20 NOTE — Final Progress Note (Signed)
Patient discharged to receiving facility via [facility]. VS stable and pt discharged with all personal belongings.

## 2020-04-20 NOTE — Progress Notes (Signed)
NURSING HOME LOCATION:  [facility] ROOM NUMBER:  [id number]  CODE STATUS:  FULL CODE  PCP:  Sherrie Mustache, MD  [address] [place] [address]  This is a comprehensive admission note to [facility] performed on this date less than 30 days from date of admission. Included are preadmission medical/surgical history; reconciled medication list; family history; social history and comprehensive review of systems.  Corrections and additions to the records were documented. Comprehensive physical exam was also performed. Additionally a clinical summary was entered for each active diagnosis pertinent to this admission in the Problem List to enhance continuity of care.  HPI: Patient was hospitalized 12/17-12/29/2021 admitted from home after being found down for unknown duration.  Upon arrival at the ED was noted to be minimally responsive with inability to follow commands with clinical diagnosis of code stroke based on left neglect.  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  Cardiology consulted and atropine was administered and patient intubated.  PPM was placed 12/20.   Hyperglycemic crisis was noted with CBGs greater than 500.  Insulin drip was initiated and PCCM consulted for admission. A1c admission was 11.9% documenting extremely poorly controlled diabetes.  Insulin drip was subsequently transitioned to subcutaneous Lantus insulin which was discontinued 12/24.  At discharge he was on glimepiride, maintenance dose of 1 mg was increased to 2 mg daily only. Baseline creatinine was felt to be 1.7; creatinine was 3.06 and GFR 21 at admission.  This was in the context of a CK of 3471 suggesting rhabdomyolysis.  At discharge creatinine was 1.37 and GFR 54 indicating CKD stage IIIa. He developed a normocytic, normal chromic anemia with final values of 10.3/33.7.  He presented with hemoglobin/hematocrit of 15.6/46.  These latter values may represent  hemoconcentration in the setting of AKI. Home antihypertensive regimen included carvedilol, hydrochlorothiazide, losartan,& triamterene/HCTZ prior to admission.  At discharge he was on carvedilol 6.25 mg twice daily only.  Past medical and surgical history: Includes essential hypertension, dyslipidemia, GERD, history of CVA, and BPH. Surgeries and procedures include left heart cath and coronary angiography & loop recorder insertion,.  Social history: Nondrinker, former light smoker.  Family history: Strong family history of stroke.   Review of systems: He initially  Stated date was May 21, 2019 but then corrected himself.  He also initially gave the president's name is [place]e [place] but then corrected this to [place].  Initially he told me he was doing "pretty good" & indeed review of systems was essentially negative.  He was not checking glucoses at home.  He does have difficulty urinating because of his BPH.  He was under the impression that the Plavix had been discontinued by his "kidney doctor".  He states that it been started by his PCP.  Constitutional: No fever, significant weight change, fatigue  Eyes: No redness, discharge, pain, vision change ENT/mouth: No nasal congestion, purulent discharge, earache, change in hearing, sore throat  Cardiovascular: No chest pain, palpitations, paroxysmal nocturnal dyspnea, claudication, edema  Respiratory: No cough, sputum production, hemoptysis, DOE, significant snoring, apnea  Gastrointestinal: No heartburn, dysphagia, abdominal pain, nausea /vomiting, rectal bleeding, melena, change in bowels Genitourinary: No dysuria, hematuria, pyuria, incontinence, nocturia Musculoskeletal: No joint stiffness, joint swelling, weakness, pain Dermatologic: No rash, pruritus, change in appearance of skin Neurologic: No dizziness, headache, syncope, seizures, numbness, tingling Psychiatric: No significant anxiety, depression, insomnia, anorexia Endocrine: No change in  hair/skin/nails, excessive thirst, excessive hunger, excessive urination  Hematologic/lymphatic: No  significant bruising, lymphadenopathy, abnormal bleeding Allergy/immunology: No itchy/watery eyes, significant sneezing, urticaria, angioedema  Physical exam:  Pertinent or positive findings: Pattern alopecia is present.  He is slightly hoarse and has a slight stammering cadence to his speech.  Mustache is present.  He is missing multiple upper and lower teeth.  There is a brief soft systolic murmur at the left base.  Second heart sound is increased.  Pedal pulses are decreased.  He is right-handed and the right upper extremity is slightly stronger than the left.  The left lower extremity is stronger than the right lower extremity but the difference is very subtle.  General appearance: Adequately nourished; no acute distress, increased work of breathing is present.   Lymphatic: No lymphadenopathy about the head, neck, axilla. Eyes: No conjunctival inflammation or lid edema is present. There is no scleral icterus. Ears:  External ear exam shows no significant lesions or deformities.   Nose:  External nasal examination shows no deformity or inflammation. Nasal mucosa are pink and moist without lesions, exudates Oral exam: Lips and gums are healthy appearing.There is no oropharyngeal erythema or exudate. Neck:  No thyromegaly, masses, tenderness noted.    Heart:  Normal rate and regular rhythm.  S1 normal without gallop, click, rub.  Lungs: Chest clear to auscultation without wheezes, rhonchi, rales, rubs. Abdomen: Bowel sounds are normal.  Abdomen is soft and nontender with no organomegaly, hernias, masses. GU: Deferred  Extremities:  No cyanosis, clubbing, edema. Neurologic exam:  Balance, Rhomberg, finger to nose testing could not be completed due to clinical state Skin: Warm & dry w/o tenting. No significant lesions or rash.  See clinical summary under each active problem in the Problem List  with associated updated therapeutic plan

## 2020-04-20 NOTE — Assessment & Plan Note (Signed)
Present blood pressure is mildly elevated.  Carvedilol will be titrated as clinically indicated.  With the probable CKD stage III; HCTZ would not be clinically indicated.

## 2020-04-20 NOTE — Assessment & Plan Note (Addendum)
04/19/2020 glimepiride 2 mg daily discontinued here @ [facility]. Sliding scale aspart 0-9 units 3 times daily with meals changed to Humalog 10 mg before breakfast and the evening meal. Fasting blood sugar and AC evening glucoses will be checked. Staff to report glucoses less than 90 or over 250.

## 2020-04-20 NOTE — Assessment & Plan Note (Addendum)
Plavix discontinued @ discharge. Dr. Sherryll Burger, [facility] will receive a copy of this H&P and specifically asked to assess the need for Plavix.  Low-dose aspirin prophylaxis continued.  Statin continued for secondary prevention.

## 2020-04-20 NOTE — Assessment & Plan Note (Addendum)
  Renal function is probably CKD stage IIIa.  Apparently he was on HCTZ prior to admission which is clinically not indicated. Potential nephrotoxic agents will be avoided.

## 2020-04-20 NOTE — Assessment & Plan Note (Signed)
Follow-up as scheduled in the cardiology device clinic.

## 2020-04-20 NOTE — Patient Instructions (Signed)
See assessment and plan under each diagnosis in the problem list and acutely for this visit 

## 2020-04-25 ENCOUNTER — Other Ambulatory Visit: Payer: Self-pay

## 2020-04-25 ENCOUNTER — Ambulatory Visit (INDEPENDENT_AMBULATORY_CARE_PROVIDER_SITE_OTHER): Payer: Medicare HMO | Admitting: Emergency Medicine

## 2020-04-25 DIAGNOSIS — I442 Atrioventricular block, complete: Secondary | ICD-10-CM

## 2020-04-25 LAB — CUP PACEART INCLINIC DEVICE CHECK
Battery Remaining Longevity: 134 mo
Battery Voltage: 3.05 V
Brady Statistic RA Percent Paced: 3.1 %
Brady Statistic RV Percent Paced: 99.99 %
Date Time Interrogation Session: 20220104103322
Implantable Lead Implant Date: 20211220
Implantable Lead Implant Date: 20211220
Implantable Lead Location: 753859
Implantable Lead Location: 753860
Implantable Pulse Generator Implant Date: 20211220
Lead Channel Impedance Value: 425 Ohm
Lead Channel Impedance Value: 562.5 Ohm
Lead Channel Pacing Threshold Amplitude: 0.5 V
Lead Channel Pacing Threshold Amplitude: 0.5 V
Lead Channel Pacing Threshold Pulse Width: 0.4 ms
Lead Channel Pacing Threshold Pulse Width: 0.4 ms
Lead Channel Sensing Intrinsic Amplitude: 5 mV
Lead Channel Setting Pacing Amplitude: 0.75 V
Lead Channel Setting Pacing Amplitude: 3.5 V
Lead Channel Setting Pacing Pulse Width: 0.4 ms
Lead Channel Setting Sensing Sensitivity: 4 mV
Pulse Gen Model: 2272
Pulse Gen Serial Number: 3879105

## 2020-04-25 NOTE — Progress Notes (Signed)
Wound check appointment. Steri-strips removed. Wound without redness or edema. Incision edges approximated, wound well healed. Wound care education complete.  Pacemaker check in clinic. Normal device function. Thresholds, sensing, impedance, consistent with previous measurements. Device programmed to maximize longevity. AT/AF burden <1%,  one AT episode that lasted 10 seconds in duration at 173 BPM. Device programmed at appropriate safety margins. Histogram distribution appropriate for patient activity level. Estimated longevity 11 years 2 months. Next scheduled monthly remote is 07/10/2020. Patient education completed. Patient to follow up with Dr. Lalla Brothers in 91 days.

## 2020-04-27 ENCOUNTER — Encounter: Payer: Self-pay | Admitting: Adult Health

## 2020-04-27 ENCOUNTER — Non-Acute Institutional Stay (INDEPENDENT_AMBULATORY_CARE_PROVIDER_SITE_OTHER): Payer: Self-pay | Admitting: Adult Health

## 2020-04-27 DIAGNOSIS — N179 Acute kidney failure, unspecified: Secondary | ICD-10-CM

## 2020-04-27 DIAGNOSIS — R531 Weakness: Secondary | ICD-10-CM

## 2020-04-27 DIAGNOSIS — E1165 Type 2 diabetes mellitus with hyperglycemia: Secondary | ICD-10-CM

## 2020-04-27 DIAGNOSIS — I442 Atrioventricular block, complete: Secondary | ICD-10-CM

## 2020-04-27 DIAGNOSIS — I10 Essential (primary) hypertension: Secondary | ICD-10-CM

## 2020-04-27 DIAGNOSIS — E7800 Pure hypercholesterolemia, unspecified: Secondary | ICD-10-CM

## 2020-04-27 NOTE — Progress Notes (Addendum)
Location:  [facility] Room Number: 119/A Place of Service:  SNF (31) Provider:  Kenard Gower, DNP, FNP-BC  Patient Care Team: Sherrie Mustache, MD as PCP - General (Internal Medicine) Lanier Prude, MD as PCP - Electrophysiology (Cardiology)  Extended Emergency Contact Information Primary Emergency Contact: Jordan [name] of [place] Phone: [phone] Relation: Sister Secondary Emergency Contact: Jordan [name] Work Phone: [phone] Mobile Phone: [phone] Relation: Son  Code Status:  Full Code  Goals of care: Advanced Directive information Advanced Directives 04/27/2020  Does Patient Have a Medical Advance Directive? No  Type of Advance Directive -  Would patient like information on creating a medical advance directive? No - Patient declined     Chief Complaint  Patient presents with  . Discharge Note    Discharge Visit     HPI:  Pt is a 75 y.o. male seen today for a discharge visit. She will discharge home on 04/30/19 with Home health Nurse for medication management, PT and OT, for therapeutic exercises.  He was admitted to [facility] on 04/19/20 post [facility] hospitalization 04/07/20 to 04/19/20.  He has a PMH of BPH, CVA, GERD, hypercholesterolemia and hypertension.  He was found down at home with altered mental state.  Police was called to home after EMS was unable to access patient.  He was brought in as a code stroke due to left neglect but was found to be bradycardic with HR in 30s.  He was given atropine, cardiology was consulted and intubated.  He was found to be in hyperglycemia hyperosmolar state with CBG > 500.  Insulin drip was started.  He was switched to Lantus which was discontinued on 12/24.  He was discharged from the hospital on glimepiride. Glimepiride was discontinued at SNF and was started on Novolog 10 units BID. He is S/P PPM on 12/20 for complete heart block.  MRI  brain negative for acute CVA.  Sodium level was elevated to peak at 150 which improved back to normal with p.o. intake.  Creatinine was elevated to 3.06 which improved from baseline with IV fluids.   Patient was admitted to this facility for short-term rehabilitation after the patient's recent hospitalization.  Patient has completed SNF rehabilitation and therapy has cleared the patient for discharge.   Past Medical History:  Diagnosis Date  . BPH (benign prostatic hyperplasia)   . CVA (cerebral vascular accident) (HCC) 10/23/2017   CT confirms subacute R parietal infarct  . GERD (gastroesophageal reflux disease)   . High cholesterol   . Hypertension    Past Surgical History:  Procedure Laterality Date  . LEFT HEART CATH AND CORONARY ANGIOGRAPHY N/A 01/28/2020   Procedure: LEFT HEART CATH AND CORONARY ANGIOGRAPHY;  Surgeon: Laurier Nancy, MD;  Location: [facility] INVASIVE CV LAB;  Service: Cardiovascular;  Laterality: N/A;  . LOOP RECORDER INSERTION N/A 10/27/2017   Procedure: LOOP RECORDER INSERTION;  Surgeon: Marinus Maw, MD;  Location: [facility] INVASIVE CV LAB;  Service: Cardiovascular;  Laterality: N/A;  . NO PAST SURGERIES    . PACEMAKER IMPLANT N/A 04/10/2020   Procedure: PACEMAKER IMPLANT;  Surgeon: Lanier Prude, MD;  Location: [facility] INVASIVE CV LAB;  Service: Cardiovascular;  Laterality: N/A;  . TEE WITHOUT CARDIOVERSION N/A 10/27/2017   Procedure: TRANSESOPHAGEAL ECHOCARDIOGRAM (TEE);  Surgeon: Pricilla Riffle, MD;  Location: [facility] ENDOSCOPY;  Service: Cardiovascular;  Laterality: N/A;  . TEMPORARY PACEMAKER N/A 04/07/2020   Procedure: TEMPORARY PACEMAKER;  Surgeon: [place]az[place], Peter M, MD;  Location: [facility] INVASIVE CV LAB;  Service: Cardiovascular;  Laterality: N/A;    No Known Allergies  Outpatient Encounter Medications as of 04/27/2020  Medication Sig  . acetaminophen (TYLENOL) 500 MG tablet Take 1,000 mg by mouth every 8 (eight) hours as needed (pain).  Marland Kitchen. albuterol (VENTOLIN HFA) 108 (90  Base) MCG/ACT inhaler Inhale 2 puffs into the lungs every 4 (four) hours as needed.  . Amino Acids-Protein Hydrolys (FEEDING SUPPLEMENT, PRO-STAT SUGAR FREE 64,) LIQD Take 30 mLs by mouth 2 (two) times daily between meals.  . Ascorbic Acid (VITAMIN C) 1000 MG tablet Take 1,000 mg by mouth daily.  Marland Kitchen. aspirin 81 MG chewable tablet Chew 81 mg by mouth daily.   . carvedilol (COREG) 6.25 MG tablet Take 1 tablet (6.25 mg total) by mouth 2 (two) times daily with a meal.  . Cholecalciferol (VITAMIN D3) 1.25 MG (50000 UT) CAPS Take 1 capsule by mouth daily in the afternoon.  . Dimethicone-Zinc Oxide-Vit A-D (A & D ZINC OXIDE) CREA Apply 1 application topically 2 (two) times daily. Apply to sacral fold bid  . feeding supplement, GLUCERNA SHAKE, (GLUCERNA SHAKE) LIQD Take 237 mLs by mouth 3 (three) times daily between meals.  . insulin aspart (NOVOLOG) 100 UNIT/ML injection Inject 10 Units into the skin 2 (two) times daily before a meal. Breakfast and evening meal  . Multiple Vitamin (MULTIVITAMIN WITH MINERALS) TABS tablet Take 1 tablet by mouth daily.  . Omega-3 Fatty Acids (FISH OIL) 1200 MG CAPS Take 1,200 mg by mouth daily.   . rosuvastatin (CRESTOR) 20 MG tablet Take 20 mg by mouth daily.  Marland Kitchen. senna (SENOKOT) 8.6 MG TABS tablet Take 1 tablet by mouth daily as needed for mild constipation.   Facility-Administered Encounter Medications as of 04/27/2020  Medication  . sodium chloride flush (NS) 0.9 % injection 3 mL    Review of Systems  GENERAL: No change in appetite, no fatigue, no weight changes, no fever or chills  MOUTH and THROAT: Denies oral discomfort, gingival pain or bleeding RESPIRATORY: no cough, SOB, DOE, wheezing, hemoptysis CARDIAC: No chest pain, edema or palpitations GI: No abdominal pain, diarrhea, constipation, heart burn, nausea or vomiting GU: Denies dysuria, frequency, hematuria, incontinence, or discharge NEUROLOGICAL: Denies dizziness, syncope, numbness, or headache PSYCHIATRIC:  Denies feelings of depression or anxiety. No report of hallucinations, insomnia, paranoia, or agitation   Immunization History  Administered Date(s) Administered  . Fluad Quad(high Dose 65+) 02/02/2019  . Influenza, High Dose Seasonal PF 02/13/2018  . Pneumococcal Conjugate-13 03/28/2018   Pertinent  Health Maintenance Due  Topic Date Due  . FOOT EXAM  Never done  . OPHTHALMOLOGY EXAM  Never done  . URINE MICROALBUMIN  Never done  . COLONOSCOPY (Pts [id number] Insurance coverage will need to be confirmed)  Never done  . PNA vac Low Risk Adult (2 of 2 - PPSV23) 03/29/2019  . INFLUENZA VACCINE  11/21/2019  . HEMOGLOBIN A1C  10/06/2020   No flowsheet data found.   Vitals:   04/27/20 1357  BP: 129/71  Pulse: 78  Resp: 18  Temp: (!) 97.5 F (36.4 C)  SpO2: 99%  Weight: 164 lb (74.4 kg)  Height: 5\' 11"  (1.803 m)   Body mass index is 22.87 kg/m.  Physical Exam  GENERAL APPEARANCE: Well nourished. In no acute distress. Normal body habitus SKIN:  Skin is warm and dry.  MOUTH and THROAT: Lips are without lesions. Oral mucosa is moist and without lesions. Tongue is normal in shape, size, and  color and without lesions RESPIRATORY: Breathing is even & unlabored, BS CTAB CARDIAC: RRR, no murmur,no extra heart sounds, no edema. [place] on left chest GI: Abdomen soft, normal BS, no masses, no tenderness EXTREMITIES:  Able to move X 4 extremities NEUROLOGICAL: There is no tremor. Speech is clear. Alert and oriented X 3. PSYCHIATRIC:  Affect and behavior are appropriate  Labs reviewed: Recent Labs    04/08/20 0439 04/08/20 1113 04/14/20 0053 04/15/20 0043 04/16/20 0445 04/17/20 0013 04/18/20 1023  NA 145   < > 150*   < > 146* 143 140  K 3.7   < > 3.7   < > 3.4* 3.5 3.7  CL 109   < > 118*   < > 115* 110 109  CO2 27   < > 23   < > 23 24 23   GLUCOSE 232*   < > 215*   < > 121* 189* 192*  BUN 36*   < > 24*   < > 18 16 10   CREATININE 2.03*   < > 1.63*   < > 1.46* 1.56* 1.37*   CALCIUM 9.0   < > 8.6*   < > 8.2* 8.2* 7.9*  MG 2.6*   < > 2.8*  --  2.5*  --  2.3  PHOS 1.9*  --   --   --  2.4*  --  2.9   < > = values in this interval not displayed.   Recent Labs    04/07/20 0915 04/12/20 0504 04/16/20 0445  AST 39 47*  --   ALT 34 34  --   ALKPHOS 59 63  --   BILITOT 1.2 0.8  --   PROT 7.3 5.3*  --   ALBUMIN 3.3* 1.6* 1.6*   Recent Labs    04/07/20 0915 04/07/20 0922 04/15/20 0043 04/16/20 0445 04/18/20 1115  WBC 17.7*   < > 13.0* 11.5* 10.6*  NEUTROABS 14.3*  --   --   --  6.7  HGB 14.7   < > 10.7* 10.9* 10.3*  HCT 47.3   < > 33.5* 35.1* 32.7*  MCV 92.9   < > 91.8 93.9 93.2  PLT 263   < > 242 259 255   < > = values in this interval not displayed.   No results found for: TSH Lab Results  Component Value Date   HGBA1C 11.9 (H) 04/07/2020   Lab Results  Component Value Date   CHOL 123 10/24/2017   HDL 32 (L) 10/24/2017   LDLCALC 75 10/24/2017   TRIG 80 10/24/2017   CHOLHDL 3.8 10/24/2017    Significant Diagnostic Results in last 30 days:  EEG  Result Date: 04/07/2020 12/25/2017, MD     04/07/2020  5:22 PM Patient Name: Jordan [name] MRN: 04/09/2020 Epilepsy Attending: Edmonia Lynch Referring Physician/Provider: Dr [id number] Date: 04/07/2020 Duration: 23.43 mins Patient history: 75yo M with ams. EEG to evaluate for seizure. Level of alertness:  comatose AEDs during EEG study: Propofol Technical aspects: This EEG study was done with scalp electrodes positioned according to the 10-20 International system of electrode placement. Electrical activity was acquired at a sampling rate of 500Hz  and reviewed with a high frequency filter of 70Hz  and a low frequency filter of 1Hz . EEG data were recorded continuously and digitally stored. Description: EEG showed continuous generalized 3 to 6 Hz theta-delta slowing. Hyperventilation and photic stimulation were not performed.   ABNORMALITY - Continuous slow, generalized IMPRESSION: This study is  suggestive of moderate to severe diffuse encephalopathy. No seizures or epileptiform discharges were seen throughout the recording. Lora Havens   CT CERVICAL SPINE WO CONTRAST  Result Date: 04/07/2020 CLINICAL DATA:  75 year old male with a history of neurologic deficit EXAM: CT CERVICAL SPINE WITHOUT CONTRAST TECHNIQUE: Multidetector CT imaging of the cervical spine was performed without intravenous contrast. Multiplanar CT image reconstructions were also generated. COMPARISON:  None. FINDINGS: Alignment: Craniocervical junction aligned. Anatomic alignment of the cervical elements. No subluxation. Skull base and vertebrae: No acute fracture at the skullbase. Vertebral body heights relatively maintained. No acute fracture identified. Soft tissues and spinal canal: Unremarkable cervical soft tissues. Lymph nodes are present, though not enlarged. Disc levels: Disc space narrowing at C5-C6 and C6-C7 with endplate sclerosis, anterior osteophyte production, and uncovertebral joint disease. Less degree of joint space narrowing at C4-C5. Mild right foraminal narrowing at C6-C7 secondary to uncovertebral joint disease. Ankylosis of the right greater than left facets at C2-C3. Left-sided facet ankylosis at C4-C5. No significant bony canal narrowing. Upper chest: Unremarkable appearance of the lung apices. Other: No bony canal narrowing. IMPRESSION: Negative for acute fracture or malalignment of the cervical spine. Degenerative changes as above. Electronically Signed   By: Corrie Mckusick D.O.   On: 04/07/2020 09:41   MR ANGIO HEAD WO CONTRAST  Result Date: 04/07/2020 CLINICAL DATA:  Code stroke follow-up EXAM: MRI HEAD WITHOUT CONTRAST MRA HEAD WITHOUT CONTRAST TECHNIQUE: Multiplanar, multiecho pulse sequences of the brain and surrounding structures were obtained without intravenous contrast. Angiographic images of the head were obtained using MRA technique without contrast. COMPARISON:  None. FINDINGS: MRI HEAD  Axial DWI and T2 FLAIR sequences were obtained. There is no acute infarction. Prominence of the ventricles and sulci reflects generalized parenchymal volume loss. 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 There is no hydrocephalus or extra-axial collection. MRA HEAD Intracranial internal carotid arteries are patent. Middle and anterior cerebral arteries are patent. Intracranial vertebral arteries, basilar artery, posterior cerebral arteries are patent. Bilateral posterior communicating arteries are present. There is no significant stenosis or aneurysm. IMPRESSION: No acute infarction. No proximal intracranial vessel occlusion. Electronically Signed   By: Macy Mis M.D.   On: 04/07/2020 11:13   MR BRAIN WO CONTRAST  Result Date: 04/07/2020 CLINICAL DATA:  Code stroke follow-up EXAM: MRI HEAD WITHOUT CONTRAST MRA HEAD WITHOUT CONTRAST TECHNIQUE: Multiplanar, multiecho pulse sequences of the brain and surrounding structures were obtained without intravenous contrast. Angiographic images of the head were obtained using MRA technique without contrast. COMPARISON:  None. FINDINGS: MRI HEAD Axial DWI and T2 FLAIR sequences were obtained. There is no acute infarction. Prominence of the ventricles and sulci reflects generalized parenchymal volume loss. 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 There is no hydrocephalus or extra-axial collection. MRA HEAD Intracranial internal carotid arteries are patent. Middle and anterior cerebral arteries are patent. Intracranial vertebral arteries, basilar artery, posterior cerebral arteries are patent. Bilateral posterior communicating arteries are present. There is no significant stenosis or aneurysm.  IMPRESSION: No acute infarction. No proximal intracranial vessel occlusion. Electronically Signed   By: Macy Mis M.D.   On: 04/07/2020 11:13   CARDIAC CATHETERIZATION  Result Date: 04/07/2020 Successful placement of temporary transvenous pacemaker via right jugular access. Plan: check portable CXR for line  placement. Will leave at rate 60 bpm, 5 mA output. Capture threshold < 0.1 mA  DG CHEST PORT 1 VIEW  Result Date: 04/12/2020 CLINICAL DATA:  Heart block EXAM: PORTABLE CHEST 1 VIEW COMPARISON:  04/11/2020 FINDINGS: Cardiac shadow is stable. Endotracheal tube and gastric catheter have been removed in the interval. Pacing device is again seen and stable. Right jugular sheath is noted in satisfactory position. Lungs are well aerated with the exception of minimal left basilar atelectasis. Loop recorder is again seen. IMPRESSION: Minimal left basilar atelectasis. No other focal abnormality is noted. Electronically Signed   By: Alcide Clever M.D.   On: 04/12/2020 08:09   DG CHEST PORT 1 VIEW  Result Date: 04/11/2020 CLINICAL DATA:  Intra-aortic balloon pump.  Pacemaker placement. EXAM: PORTABLE CHEST 1 VIEW COMPARISON:  Chest x-ray 04/10/2020. FINDINGS: Interim removal of right IJ pacer and placement of a cardiac pacemaker. Cardiac pacemaker lead tips in the right atrium and right ventricle. Endotracheal tube, NG tube, right IJ sheath in stable position. Cardiac monitor device noted over the left chest. No intra-aortic balloon pump marker noted. Heart size normal. Bibasilar atelectasis. No pleural effusion or pneumothorax. IMPRESSION: 1. Interim removal of right IJ pacer and placement of cardiac pacemaker. Cardiac pacemaker with lead tips in the right atrium and right ventricle. Remaining lines and tubes in stable position. 2. Bibasilar atelectasis. Electronically Signed   By: Maisie Fus  Register   On: 04/11/2020 06:46   DG Chest Port 1 View  Result Date: 04/10/2020 CLINICAL DATA:  Intubation.  EXAM: PORTABLE CHEST 1 VIEW COMPARISON:  04/07/2020. FINDINGS: Endotracheal tube, NG tube, right IJ pacer lead in stable position. Cardiac monitor device in stable position. Heart size stable. Low lung volumes with mild bibasilar atelectasis. No pleural effusion or pneumothorax. IMPRESSION: 1. Lines and tubes including right IJ pacer lead in stable position. 2. Low lung volumes with mild bibasilar atelectasis. Electronically Signed   By: Maisie Fus  Register   On: 04/10/2020 06:36   DG Chest Portable 1 View  Result Date: 04/07/2020 CLINICAL DATA:  Unresponsive.  Intubation. EXAM: PORTABLE CHEST 1 VIEW COMPARISON:  Chest x-ray dated December 30, 2019. FINDINGS: The patient is rotated to the right. Endotracheal tube tip approximately 1.9 cm above the carina. Enteric tube entering the stomach with the tip below the field of view. The heart size and mediastinal contours are within normal limits. Loop recorder again noted. No focal consolidation, pleural effusion, or pneumothorax. No acute osseous abnormality. IMPRESSION: 1. Appropriately positioned endotracheal tube.  No active disease. Electronically Signed   By: Obie Dredge M.D.   On: 04/07/2020 12:17   DG Chest Port 1V same Day  Result Date: 04/07/2020 CLINICAL DATA:  Confirm pacer placement. EXAM: PORTABLE CHEST 1 VIEW COMPARISON:  April 07, 2020 FINDINGS: There is stable endotracheal tube and nasogastric tube positioning. Interval right internal jugular cardiac pacer lead placement is seen with appropriate lead tip positioning seen overlying the left lung base. The heart size and mediastinal contours are within normal limits. Both lungs are clear. The visualized skeletal structures are unremarkable. IMPRESSION: Interval right internal jugular cardiac pacer placement positioning, as described above, without evidence of acute or active cardiopulmonary disease. Electronically Signed   By: Aram Candela M.D.   On: 04/07/2020 19:20   DG Abd Portable 1  View  Result Date: 04/07/2020 CLINICAL DATA:  Unresponsive.  Enteric tube placement. EXAM: PORTABLE ABDOMEN - 1 VIEW COMPARISON:  None. FINDINGS: Enteric tube in the stomach. The bowel gas pattern is  normal. No radio-opaque calculi or other significant radiographic abnormality are seen. No acute osseous abnormality. Bilateral hip osteoarthritis. IMPRESSION: 1. Enteric tube in the stomach. Electronically Signed   By: Obie DredgeWilliam T Derry M.D.   On: 04/07/2020 12:18   ECHOCARDIOGRAM COMPLETE  Result Date: 04/07/2020    ECHOCARDIOGRAM REPORT   Patient Name:   Edmonia LynchJERRY L Gawronski Date of Exam: 04/07/2020 Medical Rec #:  [id number]    Height:       71.0 in Accession #:    [id number]   Weight:       167.1 lb Date of Birth:  November 10, 1945   BSA:          1.954 m Patient Age:    74 years     BP:           131/40 mmHg Patient Gender: M            HR:           36 bpm. Exam Location:  Inpatient Procedure: 2D Echo, Color Doppler, Cardiac Doppler and Intracardiac            Opacification Agent Indications:    Heart block  History:        Patient has prior history of Echocardiogram examinations, most                 recent 10/27/2017. Risk Factors:Sleep Apnea, Former Smoker,                 Hypertension and Dyslipidemia. GERD.  Sonographer:    Ross LudwigArthur Guy RDCS (AE) Referring Phys: [id number] [facility]  Sonographer Comments: Echo performed with patient supine and on artificial respirator. Limited patient mobility. IMPRESSIONS  1. Left ventricular ejection fraction, by estimation, is 60 to 65%. The left ventricle has normal function. The left ventricle has no regional wall motion abnormalities. There is mild concentric left ventricular hypertrophy. Left ventricular diastolic function could not be evaluated.  2. Right ventricular systolic function is normal. The right ventricular size is normal.  3. The mitral valve is grossly normal. Trivial mitral valve regurgitation.  4. The aortic valve is tricuspid. Aortic valve regurgitation is  not visualized. No aortic stenosis is present. Conclusion(s)/Recommendation(s): In heart block throughout study, with ventricular rate in the 30s. Per EMR, teams are aware and managing heart block. FINDINGS  Left Ventricle: Left ventricular ejection fraction, by estimation, is 60 to 65%. The left ventricle has normal function. The left ventricle has no regional wall motion abnormalities. Definity contrast agent was given IV to delineate the left ventricular  endocardial borders. The left ventricular internal cavity size was normal in size. There is mild concentric left ventricular hypertrophy. Left ventricular diastolic function could not be evaluated. Right Ventricle: The right ventricular size is normal. Right vetricular wall thickness was not well visualized. Right ventricular systolic function is normal. Left Atrium: Left atrial size was normal in size. Right Atrium: Right atrial size was normal in size. Pericardium: There is no evidence of pericardial effusion. Mitral Valve: The mitral valve is grossly normal. There is mild thickening of the mitral valve leaflet(s). There is mild calcification of the mitral valve leaflet(s). Trivial mitral valve regurgitation. Tricuspid Valve: The tricuspid valve is normal in structure. Tricuspid valve regurgitation is trivial. No evidence of tricuspid stenosis. Aortic Valve: The aortic valve is tricuspid. Aortic valve regurgitation is not visualized. No aortic stenosis is present. Pulmonic Valve: The pulmonic valve was grossly normal. Pulmonic valve regurgitation is trivial. No evidence of pulmonic  stenosis. Aorta: The aortic root and ascending aorta are structurally normal, with no evidence of dilitation. Venous: IVC assessment for right atrial pressure unable to be performed due to mechanical ventilation. IAS/Shunts: The atrial septum is grossly normal.  LEFT VENTRICLE PLAX 2D LVIDd:         3.25 cm LVIDs:         2.25 cm LV PW:         1.45 cm LV IVS:        1.25 cm LVOT  diam:     1.60 cm LVOT Area:     2.01 cm  RIGHT VENTRICLE RV Basal diam:  3.20 cm RV S prime:     12.60 cm/s TAPSE (M-mode): 2.1 cm LEFT ATRIUM             Index       RIGHT ATRIUM           Index LA diam:        2.80 cm 1.43 cm/m  RA Area:     11.50 cm LA Vol (A2C):   47.5 ml 24.31 ml/m RA Volume:   22.20 ml  11.36 ml/m LA Vol (A4C):   27.5 ml 14.08 ml/m LA Biplane Vol: 36.5 ml 18.68 ml/m   AORTA Ao Root diam: 3.10 cm Ao Asc diam:  2.50 cm TRICUSPID VALVE TR Peak grad:   15.4 mmHg TR Vmax:        196.00 cm/s  SHUNTS Systemic Diam: 1.60 cm Jodelle Red MD Electronically signed by Jodelle Red MD Signature Date/Time: 04/07/2020/4:27:00 PM    Final    CUP PACEART INCLINIC DEVICE CHECK  Result Date: 04/25/2020 Pacemaker check in clinic. Normal device function. Thresholds, sensing, impedance, consistent with previous measurements. Device programmed to maximize longevity. AT/AF burden <1%,  one AT episode that lasted 10 seconds in duration at 173 BPM. Device programmed at appropriate safety margins. Histogram distribution appropriate for patient activity level. Estimated longevity 11 years 2 months. Next scheduled monthly remote is 07/10/2020. Patient education completed. Patient to follow up with Dr. Lalla Brothers in 91 days. Pacemaker check in clinic. Normal device function. Thresholds, sensing, impedance, consistent with previous measurements. Device programmed to maximize longevity. AT/AF burden <1%,  one AT episode that lasted 10 seconds in duration at 173 BPM. Device programmed at appropriate safety margins. Histogram distribution appropriate for patient activity level. Estimated longevity 11 years 2 months. Next scheduled monthly remote is 07/10/2020. Patient education completed. Patient to follow up with Dr. Lalla Brothers in 91 days. Wound check appointment. Steri-strips removed. Wound without redness or edema. Incision edges approximated, wound well healed. Wound care education complete.  CT  HEAD CODE STROKE WO CONTRAST  Result Date: 04/07/2020 CLINICAL DATA:  Code stroke.  Left-sided weakness EXAM: CT HEAD WITHOUT CONTRAST TECHNIQUE: Contiguous axial images were obtained from the base of the skull through the vertex without intravenous contrast. COMPARISON:  02/20/2019 FINDINGS: Brain: There is no acute intracranial hemorrhage, mass effect, or edema. No new loss of gray-white differentiation. Multiple chronic infarcts are again identified within encephalomalacia/gliosis present in the parasagittal right frontal lobe, right parietal lobe, and left occipital lobe. There are also chronic small vessel infarcts of the left basal ganglia and adjacent white matter and left thalamus. Prominence of the ventricles and sulci reflects stable parenchymal volume loss. Additional patchy and confluent areas of hypoattenuation in the supratentorial white matter probably reflect stable chronic microvascular ischemic changes. Vascular: No hyperdense vessel. There is intracranial atherosclerotic calcification at the skull base.  Skull: Unremarkable. Sinuses/Orbits: No acute abnormality. Other: Mastoid air cells are clear. IMPRESSION: No acute intracranial hemorrhage or evidence of acute infarction. Multiple chronic infarcts.  Chronic microvascular ischemic changes. These results were communicated to Dr. Iver Nestle at 9:38 am on 04/07/2020 by text page via the [facility] messaging system. Electronically Signed   By: Guadlupe Spanish M.D.   On: 04/07/2020 09:41    Assessment/Plan  1. Hyperglycemic crisis due to diabetes mellitus (HCC) Lab Results  Component Value Date   HGBA1C 11.9 (H) 04/07/2020   - insulin aspart (NOVOLOG) 100 UNIT/ML injection; Inject 10 Units into the skin 2 (two) times daily before a meal. Breakfast and evening meal  Dispense: 10 mL; Refill: 0 -Continue CBG checks   2. 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 AKI (acute kidney injury) [facility]) Lab Results   Component Value Date   NA 140 04/18/2020   K 3.7 04/18/2020   CO2 23 04/18/2020   GLUCOSE 192 (H) 04/18/2020   BUN 10 04/18/2020   CREATININE 1.37 (H) 04/18/2020   CALCIUM 7.9 (L) 04/18/2020   GFRNONAA 54 (L) 04/18/2020   GFRAA 45 (L) 12/30/2019   - improved with IV fluids in the hospital  4. Essential hypertension - carvedilol (COREG) 6.25 MG tablet; Take 1 tablet (6.25 mg total) by mouth 2 (two) times daily with a meal.  Dispense: 60 tablet; Refill: 0  5. Pure hypercholesterolemia - rosuvastatin (CRESTOR) 20 MG tablet; Take 1 tablet (20 mg total) by mouth daily.  Dispense: 30 tablet; Refill: 0  6. Generalized weakness -  For Home health PT and OT, for therapeutic strengthening exercises    I have filled out patient's discharge paperwork and e-prescribed medications.  Patient will have home health Nurse, PT and OT.  DME provided:  Rolling walker  Total discharge time: Greater than 30 minutes Greater than 50% was spent in counseling and coordination of care.   Discharge time involved coordination of the discharge process with social worker, nursing staff and therapy department. Medical justification for home health services/DME verified.    Kenard Gower, DNP, MSN, FNP-BC [facility] [phone] (Monday-Friday 8:00 a.m. - 5:00 p.m.) [phone] (after hours)

## 2020-04-28 ENCOUNTER — Other Ambulatory Visit: Payer: Self-pay

## 2020-04-28 ENCOUNTER — Other Ambulatory Visit: Payer: Self-pay | Admitting: Adult Health

## 2020-04-28 DIAGNOSIS — E1165 Type 2 diabetes mellitus with hyperglycemia: Secondary | ICD-10-CM

## 2020-04-28 MED ORDER — BLOOD GLUCOSE METER KIT: PACK | 0 refills | Status: DC

## 2020-04-28 MED ORDER — BLOOD GLUCOSE METER KIT: PACK | 0 refills | Status: AC

## 2020-04-28 MED ORDER — CARVEDILOL 6.25 MG PO TABS: 6.2500 mg | ORAL_TABLET | Freq: Two times a day (BID) | ORAL | 0 refills | Status: AC

## 2020-04-28 MED ORDER — INSULIN ASPART 100 UNIT/ML ~~LOC~~ SOLN: 10.0000 [IU] | Freq: Two times a day (BID) | SUBCUTANEOUS | 0 refills | Status: DC

## 2020-04-28 MED ORDER — ALBUTEROL SULFATE HFA 108 (90 BASE) MCG/ACT IN AERS: 2.0000 | INHALATION_SPRAY | RESPIRATORY_TRACT | 0 refills | Status: AC | PRN

## 2020-04-28 MED ORDER — INSULIN ASPART 100 UNIT/ML ~~LOC~~ SOLN: 10.0000 [IU] | Freq: Two times a day (BID) | SUBCUTANEOUS | 0 refills | Status: AC

## 2020-04-28 MED ORDER — VITAMIN D3 1.25 MG (50000 UT) PO CAPS: ORAL_CAPSULE | ORAL | 0 refills | Status: AC

## 2020-04-28 MED ORDER — ROSUVASTATIN CALCIUM 20 MG PO TABS: 20.0000 mg | ORAL_TABLET | Freq: Every day | ORAL | 0 refills | Status: AC

## 2020-05-01 ENCOUNTER — Ambulatory Visit (INDEPENDENT_AMBULATORY_CARE_PROVIDER_SITE_OTHER): Payer: Medicare HMO

## 2020-05-01 DIAGNOSIS — I6300 Cerebral infarction due to thrombosis of unspecified precerebral artery: Secondary | ICD-10-CM | POA: Diagnosis not present

## 2020-05-02 LAB — CUP PACEART REMOTE DEVICE CHECK
Date Time Interrogation Session: 20220110100844
Implantable Pulse Generator Implant Date: 20190708

## 2020-05-15 NOTE — Progress Notes (Signed)
[facility] Summary Report / Loop Recorder 

## 2020-06-07 ENCOUNTER — Other Ambulatory Visit (INDEPENDENT_AMBULATORY_CARE_PROVIDER_SITE_OTHER): Payer: Self-pay | Admitting: Nephrology

## 2020-06-07 DIAGNOSIS — I739 Peripheral vascular disease, unspecified: Secondary | ICD-10-CM

## 2020-06-08 ENCOUNTER — Other Ambulatory Visit: Payer: Self-pay

## 2020-06-08 ENCOUNTER — Ambulatory Visit (INDEPENDENT_AMBULATORY_CARE_PROVIDER_SITE_OTHER): Payer: Medicare HMO

## 2020-06-08 DIAGNOSIS — I739 Peripheral vascular disease, unspecified: Secondary | ICD-10-CM | POA: Diagnosis not present

## 2020-07-10 ENCOUNTER — Ambulatory Visit (INDEPENDENT_AMBULATORY_CARE_PROVIDER_SITE_OTHER): Payer: Medicare (Managed Care)

## 2020-07-10 DIAGNOSIS — I442 Atrioventricular block, complete: Secondary | ICD-10-CM | POA: Diagnosis not present

## 2020-07-11 LAB — CUP PACEART REMOTE DEVICE CHECK
Battery Remaining Longevity: 109 mo
Battery Remaining Percentage: 95.5 %
Battery Voltage: 3.01 V
Brady Statistic AP VP Percent: 1 %
Brady Statistic AP VS Percent: 1 %
Brady Statistic AS VP Percent: 99 %
Brady Statistic AS VS Percent: 1 %
Brady Statistic RA Percent Paced: 1 %
Brady Statistic RV Percent Paced: 99 %
Date Time Interrogation Session: 20220321020017
Implantable Lead Implant Date: 20211220
Implantable Lead Implant Date: 20211220
Implantable Lead Location: 753859
Implantable Lead Location: 753860
Implantable Pulse Generator Implant Date: 20211220
Lead Channel Impedance Value: 480 Ohm
Lead Channel Impedance Value: 550 Ohm
Lead Channel Pacing Threshold Amplitude: 0.5 V
Lead Channel Pacing Threshold Amplitude: 0.75 V
Lead Channel Pacing Threshold Pulse Width: 0.4 ms
Lead Channel Pacing Threshold Pulse Width: 0.4 ms
Lead Channel Sensing Intrinsic Amplitude: 12 mV
Lead Channel Sensing Intrinsic Amplitude: 5 mV
Lead Channel Setting Pacing Amplitude: 1 V
Lead Channel Setting Pacing Amplitude: 3.5 V
Lead Channel Setting Pacing Pulse Width: 0.4 ms
Lead Channel Setting Sensing Sensitivity: 4 mV
Pulse Gen Model: 2272
Pulse Gen Serial Number: 3879105

## 2020-07-12 ENCOUNTER — Encounter: Payer: Medicare HMO | Admitting: Cardiology

## 2020-07-14 ENCOUNTER — Encounter: Payer: Self-pay | Admitting: Cardiology

## 2020-07-14 ENCOUNTER — Ambulatory Visit (INDEPENDENT_AMBULATORY_CARE_PROVIDER_SITE_OTHER): Payer: Medicare (Managed Care) | Admitting: Cardiology

## 2020-07-14 ENCOUNTER — Other Ambulatory Visit: Payer: Self-pay

## 2020-07-14 VITALS — BP 98/64 | HR 88 | Ht 71.0 in | Wt 147.4 lb

## 2020-07-14 DIAGNOSIS — Z950 Presence of cardiac pacemaker: Secondary | ICD-10-CM | POA: Diagnosis not present

## 2020-07-14 DIAGNOSIS — I442 Atrioventricular block, complete: Secondary | ICD-10-CM

## 2020-07-14 NOTE — Progress Notes (Signed)
Electrophysiology Office Follow up Visit Note:    Date:  07/14/2020   ID:  Jordan [name], DOB [birth year], MRN [id number]  PCP:  Casilda Carls, MD  [facility] Cardiologist:  No primary care provider on file.  [facility] Electrophysiologist:  Vickie Epley, MD    Interval History:    Jordan [name] is a 75 y.o. male who presents for a follow up visit after a permanent pacemaker implant on April 10, 2020 for complete heart block. He has been doing well without any complaints.  His pacemaker pocket is well-healed.     Past Medical History:  Diagnosis Date  . BPH (benign prostatic hyperplasia)   . CVA (cerebral vascular accident) ([place]) 10/23/2017   CT confirms subacute R parietal infarct  . GERD (gastroesophageal reflux disease)   . High cholesterol   . Hypertension     Past Surgical History:  Procedure Laterality Date  . LEFT HEART CATH AND CORONARY ANGIOGRAPHY N/A 01/28/2020   Procedure: LEFT HEART CATH AND CORONARY ANGIOGRAPHY;  Surgeon: Dionisio David, MD;  Location: [place] CV LAB;  Service: Cardiovascular;  Laterality: N/A;  . LOOP RECORDER INSERTION N/A 10/27/2017   Procedure: LOOP RECORDER INSERTION;  Surgeon: Evans Lance, MD;  Location: [place] CV LAB;  Service: Cardiovascular;  Laterality: N/A;  . NO PAST SURGERIES    . PACEMAKER IMPLANT N/A 04/10/2020   Procedure: PACEMAKER IMPLANT;  Surgeon: Vickie Epley, MD;  Location: [place] CV LAB;  Service: Cardiovascular;  Laterality: N/A;  . TEE WITHOUT CARDIOVERSION N/A 10/27/2017   Procedure: TRANSESOPHAGEAL ECHOCARDIOGRAM (TEE);  Surgeon: Fay Records, MD;  Location: [facility] ENDOSCOPY;  Service: Cardiovascular;  Laterality: N/A;  . TEMPORARY PACEMAKER N/A 04/07/2020   Procedure: TEMPORARY PACEMAKER;  Surgeon: Martinique, Peter M, MD;  Location: [place] CV LAB;  Service: Cardiovascular;  Laterality: N/A;    Current Medications: Current Meds  Medication Sig  . acetaminophen (TYLENOL) 500 MG  tablet Take 1,000 mg by mouth every 8 (eight) hours as needed (pain).  Marland Kitchen albuterol (VENTOLIN HFA) 108 (90 Base) MCG/ACT inhaler Inhale 2 puffs into the lungs every 4 (four) hours as needed.  . Amino Acids-Protein Hydrolys (FEEDING SUPPLEMENT, PRO-STAT SUGAR FREE 64,) LIQD Take 30 mLs by mouth 2 (two) times daily between meals.  . Ascorbic Acid (VITAMIN C) 1000 MG tablet Take 1,000 mg by mouth daily.  Marland Kitchen aspirin 81 MG chewable tablet Chew 81 mg by mouth daily.   . blood glucose meter kit and supplies Dispense based on patient and insurance preference. Use up to two times daily  (FOR ICD-10 E10.9, E11.9).  . carvedilol (COREG) 6.25 MG tablet Take 1 tablet (6.25 mg total) by mouth 2 (two) times daily with a meal.  . Cholecalciferol (VITAMIN D3) 1.25 MG (50000 UT) CAPS Take 1 capsule by mouth every Saturdays  . feeding supplement, GLUCERNA SHAKE, (GLUCERNA SHAKE) LIQD Take 237 mLs by mouth 3 (three) times daily between meals.  . insulin aspart (NOVOLOG) 100 UNIT/ML injection Inject 10 Units into the skin 2 (two) times daily before a meal. Breakfast and evening meal  . Multiple Vitamin (MULTIVITAMIN WITH MINERALS) TABS tablet Take 1 tablet by mouth daily.  . Omega-3 Fatty Acids (FISH OIL) 1200 MG CAPS Take 1,200 mg by mouth daily.   . rosuvastatin (CRESTOR) 20 MG tablet Take 1 tablet (20 mg total) by mouth daily.  Marland Kitchen senna (SENOKOT) 8.6 MG TABS tablet Take 1 tablet by mouth daily as needed for mild  constipation.     Allergies:   Patient has no known allergies.   Social History   Socioeconomic History  . Marital status: Divorced    Spouse name: Not on file  . Number of children: Not on file  . Years of education: Not on file  . Highest education level: Not on file  Occupational History  . Not on file  Tobacco Use  . Smoking status: Former Smoker    Packs/day: 0.12    Years: 10.00    Pack years: 1.20    Types: Cigarettes, Pipe    Quit date: 1990    Years since quitting: 32.2  . Smokeless  tobacco: Never Used  Vaping Use  . Vaping Use: Never used  Substance and Sexual Activity  . Alcohol use: Never  . Drug use: Never  . Sexual activity: Not on file  Other Topics Concern  . Not on file  Social History Narrative  . Not on file   Social Determinants of Health   Financial Resource Strain: Not on file  Food Insecurity: Not on file  Transportation Needs: Not on file  Physical Activity: Not on file  Stress: Not on file  Social Connections: Not on file     Family History: The patient's family history includes CVA in his father; CVA (age of onset: 70) in his mother; Heart attack in his father; Stroke in his father and mother.  ROS:   Please see the history of present illness.    All other systems reviewed and are negative.  EKGs/Labs/Other Studies Reviewed:    The following studies were reviewed today:  Remote interrogations have been reviewed and shows stable lead parameters and good battery longevity.  No sustained arrhythmias.  July 14, 2020 device interrogation personally reviewed DDD 60-1 30 Lead parameters are stable.  16 episodes of mode switching related to far field.  I have increased the PV AB to address this.  Pacing output decreased to maximize longevity.  EKG:  The ekg ordered today demonstrates atrial sensing, ventricular pacing.  Recent Labs: 04/12/2020: ALT 34 04/18/2020: BUN 10; Creatinine, Ser 1.37; Hemoglobin 10.3; Magnesium 2.3; Platelets 255; Potassium 3.7; Sodium 140  Recent Lipid Panel    Component Value Date/Time   CHOL 123 10/24/2017 0357   TRIG 80 10/24/2017 0357   HDL 32 (L) 10/24/2017 0357   CHOLHDL 3.8 10/24/2017 0357   VLDL 16 10/24/2017 0357   LDLCALC 75 10/24/2017 0357    Physical Exam:    VS:  BP 98/64   Pulse 88   Ht _0  (1.803 m)   Wt 147 lb 6.4 oz (66.9 kg)   SpO2 97%   BMI 20.56 kg/m     Wt Readings from Last 3 Encounters:  07/14/20 147 lb 6.4 oz (66.9 kg)  04/27/20 164 lb (74.4 kg)  04/20/20 175 lb  14.9 oz (79.8 kg)     GEN:  Well nourished, well developed in no acute distress HEENT: Normal NECK: No JVD; No carotid bruits LYMPHATICS: No lymphadenopathy CARDIAC: RRR, no murmurs, rubs, gallops.  Pacemaker pocket well-healed without pain. RESPIRATORY:  Clear to auscultation without rales, wheezing or rhonchi  ABDOMEN: Soft, non-tender, non-distended MUSCULOSKELETAL:  No edema; No deformity  SKIN: Warm and dry NEUROLOGIC:  Alert and oriented x 3 PSYCHIATRIC:  Normal affect   ASSESSMENT:    1. Heart block AV complete ([place])   2. Pacemaker    PLAN:    In order of problems listed above:  1. Complete heart  block post permanent pacemaker Pacemaker well-functioning.  Lead outputs adjusted to maximize longevity.  PV AB increased to help with far field oversensing triggering 16 mode switch episodes. Plan to follow-up in 9 months.   Medication Adjustments/Labs and Tests Ordered: Current medicines are reviewed at length with the patient today.  Concerns regarding medicines are outlined above.  Orders Placed This Encounter  Procedures  . EKG 12-Lead   No orders of the defined types were placed in this encounter.    Signed, Lars Mage, MD, [facility]  07/14/2020 9:53 AM    Electrophysiology [facility]

## 2020-07-14 NOTE — Patient Instructions (Signed)
Medication Instructions:  Your physician recommends that you continue on your current medications as directed. Please refer to the Current Medication list given to you today. *If you need a refill on your cardiac medications before your next appointment, please call your pharmacy*  Lab Work: None ordered. If you have labs (blood work) drawn today and your tests are completely normal, you will receive your results only by: Marland Kitchen MyChart Message (if you have MyChart) OR . A paper copy in the mail If you have any lab test that is abnormal or we need to change your treatment, we will call you to review the results.  Testing/Procedures: None ordered.  Follow-Up: At [facility], you and your health needs are our priority.  As part of our continuing mission to provide you with exceptional heart care, we have created designated Provider Care Teams.  These Care Teams include your primary Cardiologist (physician) and Advanced Practice Providers (APPs -  Physician Assistants and Nurse Practitioners) who all work together to provide you with the care you need, when you need it.  Your next appointment:   Your physician wants you to follow-up in: 9 months with Dr. Lalla Brothers.   You will receive a reminder letter in the mail two months in advance. If you don't receive a letter, please call our office to schedule the follow-up appointment.  Remote monitoring is used to monitor your Pacemaker  from home. This monitoring reduces the number of office visits required to check your device to one time per year. It allows [place] to keep an eye on the functioning of your device to ensure it is working properly. You are scheduled for a device check from home on 08/03/2020. You may send your transmission at any time that day. If you have a wireless device, the transmission will be sent automatically. After your physician reviews your transmission, you will receive a postcard with your next transmission date.

## 2020-07-18 NOTE — Progress Notes (Signed)
Remote pacemaker transmission.   

## 2020-08-02 ENCOUNTER — Other Ambulatory Visit: Payer: Self-pay | Admitting: Internal Medicine

## 2020-08-02 DIAGNOSIS — H547 Unspecified visual loss: Secondary | ICD-10-CM

## 2020-08-03 ENCOUNTER — Ambulatory Visit: Payer: Medicare HMO

## 2020-08-04 LAB — CUP PACEART REMOTE DEVICE CHECK
Battery Remaining Longevity: 124 mo
Battery Remaining Percentage: 95.5 %
Battery Voltage: 2.99 V
Brady Statistic AP VP Percent: 1 %
Brady Statistic AP VS Percent: 1 %
Brady Statistic AS VP Percent: 99 %
Brady Statistic AS VS Percent: 1 %
Brady Statistic RA Percent Paced: 1 %
Brady Statistic RV Percent Paced: 99 %
Date Time Interrogation Session: 20220414020012
Implantable Lead Implant Date: 20211220
Implantable Lead Implant Date: 20211220
Implantable Lead Location: 753859
Implantable Lead Location: 753860
Implantable Pulse Generator Implant Date: 20211220
Lead Channel Impedance Value: 440 Ohm
Lead Channel Impedance Value: 560 Ohm
Lead Channel Pacing Threshold Amplitude: 0.5 V
Lead Channel Pacing Threshold Amplitude: 0.875 V
Lead Channel Pacing Threshold Pulse Width: 0.4 ms
Lead Channel Pacing Threshold Pulse Width: 0.4 ms
Lead Channel Sensing Intrinsic Amplitude: 12 mV
Lead Channel Sensing Intrinsic Amplitude: 5 mV
Lead Channel Setting Pacing Amplitude: 1.125
Lead Channel Setting Pacing Amplitude: 2 V
Lead Channel Setting Pacing Pulse Width: 0.4 ms
Lead Channel Setting Sensing Sensitivity: 4 mV
Pulse Gen Model: 2272
Pulse Gen Serial Number: 3879105

## 2020-08-15 ENCOUNTER — Other Ambulatory Visit: Payer: Medicare (Managed Care)

## 2020-08-21 ENCOUNTER — Other Ambulatory Visit: Payer: Self-pay

## 2020-08-21 ENCOUNTER — Ambulatory Visit
Admission: RE | Admit: 2020-08-21 | Discharge: 2020-08-21 | Disposition: A | Discharge status: Home / Self Care | Payer: Medicare (Managed Care) | Source: Ambulatory Visit | Attending: Internal Medicine | Admitting: Internal Medicine

## 2020-08-21 DIAGNOSIS — H547 Unspecified visual loss: Secondary | ICD-10-CM

## 2020-08-30 ENCOUNTER — Telehealth: Payer: Self-pay | Admitting: *Deleted

## 2020-08-30 NOTE — Telephone Encounter (Signed)
   [facility] Pre-operative Risk Assessment    Patient Name: Jordan [name]  DOB: 1945-05-29  MRN: [id number]   HEARTCARE STAFF: - Please ensure there is not already an duplicate clearance open for this procedure. - Under Visit Info/Reason for Call, type in Other and utilize the format Clearance MM/DD/YY or Clearance TBD. Do not use dashes or single digits. - If request is for dental extraction, please clarify the # of teeth to be extracted.  Request for surgical clearance:  1. What type of surgery is being performed? HERNIA REPAIR    2. When is this surgery scheduled? TBD   3. What type of clearance is required (medical clearance vs. Pharmacy clearance to hold med vs. Both)? MEDICAL  4. Are there any medications that need to be held prior to surgery and how long? ASA x 5 DAYS PRIOR  5. Practice name and name of physician performing surgery? [facility]; DR. Gurney Maxin    6. What is the office phone number? [phone]   7.   What is the office fax number? [place]  8.   Anesthesia type (None, local, MAC, general) ? GENERAL   Julaine Hua 08/30/2020, 12:43 PM  _________________________________________________________________   (provider comments below)

## 2020-08-30 NOTE — Telephone Encounter (Signed)
    Jordan [name] DOB:  Nov 24, 1945  MRN:  [id number]   Primary Cardiologist: Dr. Welton [name]  Chart reviewed as part of pre-operative protocol coverage.   Patient's general cardiologist is Dr. Welton [name] in [place]. He follows with Dr. Lalla [name] for EP care with [facility]. Therefore will defer preop assessment to patients primary cardiologist, Dr. Welton [name].  I will route this recommendation to the requesting party via Epic fax function and remove from pre-op pool.  Please call with questions.  Beatriz Stallion, PA-C 08/30/2020, 5:01 PM

## 2020-09-21 NOTE — Telephone Encounter (Signed)
    [facility] asking copy of this clearance that we are deffering clearance to pt's cards Dr. Welton Flakes, so that Dr. Regino Schultze pt's pcp is aware. She gave fax# [phone]

## 2020-09-22 NOTE — Telephone Encounter (Signed)
Faxed via Epic fax function to requested fax number.  Jordan [name]

## 2020-10-09 ENCOUNTER — Ambulatory Visit (INDEPENDENT_AMBULATORY_CARE_PROVIDER_SITE_OTHER): Payer: Medicare (Managed Care)

## 2020-10-09 DIAGNOSIS — I442 Atrioventricular block, complete: Secondary | ICD-10-CM | POA: Diagnosis not present

## 2020-10-10 LAB — CUP PACEART REMOTE DEVICE CHECK
Battery Remaining Longevity: 119 mo
Battery Remaining Percentage: 95.5 %
Battery Voltage: 2.99 V
Brady Statistic AP VP Percent: 1 %
Brady Statistic AP VS Percent: 1 %
Brady Statistic AS VP Percent: 99 %
Brady Statistic AS VS Percent: 1 %
Brady Statistic RA Percent Paced: 1 %
Brady Statistic RV Percent Paced: 99 %
Date Time Interrogation Session: 20220620020022
Implantable Lead Implant Date: 20211220
Implantable Lead Implant Date: 20211220
Implantable Lead Location: 753859
Implantable Lead Location: 753860
Implantable Pulse Generator Implant Date: 20211220
Lead Channel Impedance Value: 450 Ohm
Lead Channel Impedance Value: 490 Ohm
Lead Channel Pacing Threshold Amplitude: 0.5 V
Lead Channel Pacing Threshold Amplitude: 0.75 V
Lead Channel Pacing Threshold Pulse Width: 0.4 ms
Lead Channel Pacing Threshold Pulse Width: 0.4 ms
Lead Channel Sensing Intrinsic Amplitude: 12 mV
Lead Channel Sensing Intrinsic Amplitude: 5 mV
Lead Channel Setting Pacing Amplitude: 1 V
Lead Channel Setting Pacing Amplitude: 2 V
Lead Channel Setting Pacing Pulse Width: 0.4 ms
Lead Channel Setting Sensing Sensitivity: 4 mV
Pulse Gen Model: 2272
Pulse Gen Serial Number: 3879105

## 2020-10-25 ENCOUNTER — Ambulatory Visit (HOSPITAL_COMMUNITY)
Admission: RE | Admit: 2020-10-25 | Discharge: 2020-10-25 | Disposition: A | Discharge status: Home / Self Care | Payer: Medicare (Managed Care) | Source: Ambulatory Visit | Attending: Internal Medicine | Admitting: Internal Medicine

## 2020-10-25 ENCOUNTER — Other Ambulatory Visit: Payer: Self-pay | Admitting: Internal Medicine

## 2020-10-25 ENCOUNTER — Other Ambulatory Visit: Payer: Self-pay

## 2020-10-25 ENCOUNTER — Other Ambulatory Visit (HOSPITAL_COMMUNITY): Payer: Self-pay | Admitting: Internal Medicine

## 2020-10-25 DIAGNOSIS — N400 Enlarged prostate without lower urinary tract symptoms: Secondary | ICD-10-CM

## 2020-10-25 DIAGNOSIS — N50812 Left testicular pain: Secondary | ICD-10-CM | POA: Insufficient documentation

## 2020-10-27 NOTE — Progress Notes (Signed)
Remote pacemaker transmission.   

## 2020-12-07 ENCOUNTER — Ambulatory Visit (INDEPENDENT_AMBULATORY_CARE_PROVIDER_SITE_OTHER): Payer: Medicare (Managed Care)

## 2020-12-07 DIAGNOSIS — I442 Atrioventricular block, complete: Secondary | ICD-10-CM | POA: Diagnosis not present

## 2020-12-07 LAB — CUP PACEART REMOTE DEVICE CHECK
Battery Remaining Longevity: 115 mo
Battery Remaining Percentage: 95.5 %
Battery Voltage: 2.99 V
Brady Statistic AP VP Percent: 1 %
Brady Statistic AP VS Percent: 1 %
Brady Statistic AS VP Percent: 99 %
Brady Statistic AS VS Percent: 1 %
Brady Statistic RA Percent Paced: 1 %
Brady Statistic RV Percent Paced: 99 %
Date Time Interrogation Session: 20220818020015
Implantable Lead Implant Date: 20211220
Implantable Lead Implant Date: 20211220
Implantable Lead Location: 753859
Implantable Lead Location: 753860
Implantable Pulse Generator Implant Date: 20211220
Lead Channel Impedance Value: 410 Ohm
Lead Channel Impedance Value: 450 Ohm
Lead Channel Pacing Threshold Amplitude: 0.5 V
Lead Channel Pacing Threshold Amplitude: 0.75 V
Lead Channel Pacing Threshold Pulse Width: 0.4 ms
Lead Channel Pacing Threshold Pulse Width: 0.4 ms
Lead Channel Sensing Intrinsic Amplitude: 11.5 mV
Lead Channel Sensing Intrinsic Amplitude: 5 mV
Lead Channel Setting Pacing Amplitude: 1 V
Lead Channel Setting Pacing Amplitude: 2 V
Lead Channel Setting Pacing Pulse Width: 0.4 ms
Lead Channel Setting Sensing Sensitivity: 4 mV
Pulse Gen Model: 2272
Pulse Gen Serial Number: 3879105

## 2020-12-27 NOTE — Progress Notes (Signed)
[facility] Summary Report / Loop Recorder 

## 2021-01-08 ENCOUNTER — Ambulatory Visit (INDEPENDENT_AMBULATORY_CARE_PROVIDER_SITE_OTHER): Payer: Medicare (Managed Care)

## 2021-01-08 DIAGNOSIS — I442 Atrioventricular block, complete: Secondary | ICD-10-CM | POA: Diagnosis not present

## 2021-01-09 LAB — CUP PACEART REMOTE DEVICE CHECK
Battery Remaining Longevity: 115 mo
Battery Remaining Percentage: 95 %
Battery Voltage: 2.99 V
Brady Statistic AP VP Percent: 1 %
Brady Statistic AP VS Percent: 1 %
Brady Statistic AS VP Percent: 99 %
Brady Statistic AS VS Percent: 1 %
Brady Statistic RA Percent Paced: 1 %
Brady Statistic RV Percent Paced: 99 %
Date Time Interrogation Session: 20220919020013
Implantable Lead Implant Date: 20211220
Implantable Lead Implant Date: 20211220
Implantable Lead Location: 753859
Implantable Lead Location: 753860
Implantable Pulse Generator Implant Date: 20211220
Lead Channel Impedance Value: 440 Ohm
Lead Channel Impedance Value: 460 Ohm
Lead Channel Pacing Threshold Amplitude: 0.5 V
Lead Channel Pacing Threshold Amplitude: 0.75 V
Lead Channel Pacing Threshold Pulse Width: 0.4 ms
Lead Channel Pacing Threshold Pulse Width: 0.4 ms
Lead Channel Sensing Intrinsic Amplitude: 11.5 mV
Lead Channel Sensing Intrinsic Amplitude: 5 mV
Lead Channel Setting Pacing Amplitude: 1 V
Lead Channel Setting Pacing Amplitude: 2 V
Lead Channel Setting Pacing Pulse Width: 0.4 ms
Lead Channel Setting Sensing Sensitivity: 4 mV
Pulse Gen Model: 2272
Pulse Gen Serial Number: 3879105

## 2021-01-12 NOTE — Progress Notes (Signed)
Remote pacemaker transmission.   

## 2021-04-09 ENCOUNTER — Ambulatory Visit (INDEPENDENT_AMBULATORY_CARE_PROVIDER_SITE_OTHER): Payer: Medicare (Managed Care)

## 2021-04-09 DIAGNOSIS — I442 Atrioventricular block, complete: Secondary | ICD-10-CM | POA: Diagnosis not present

## 2021-04-09 LAB — CUP PACEART REMOTE DEVICE CHECK
Battery Remaining Longevity: 113 mo
Battery Remaining Percentage: 93 %
Battery Voltage: 2.99 V
Brady Statistic AP VP Percent: 1 %
Brady Statistic AP VS Percent: 1 %
Brady Statistic AS VP Percent: 99 %
Brady Statistic AS VS Percent: 1 %
Brady Statistic RA Percent Paced: 1 %
Brady Statistic RV Percent Paced: 99 %
Date Time Interrogation Session: 20221219020014
Implantable Lead Implant Date: 20211220
Implantable Lead Implant Date: 20211220
Implantable Lead Location: 753859
Implantable Lead Location: 753860
Implantable Pulse Generator Implant Date: 20211220
Lead Channel Impedance Value: 450 Ohm
Lead Channel Impedance Value: 480 Ohm
Lead Channel Pacing Threshold Amplitude: 0.5 V
Lead Channel Pacing Threshold Amplitude: 0.875 V
Lead Channel Pacing Threshold Pulse Width: 0.4 ms
Lead Channel Pacing Threshold Pulse Width: 0.4 ms
Lead Channel Sensing Intrinsic Amplitude: 11.5 mV
Lead Channel Sensing Intrinsic Amplitude: 5 mV
Lead Channel Setting Pacing Amplitude: 1.125
Lead Channel Setting Pacing Amplitude: 2 V
Lead Channel Setting Pacing Pulse Width: 0.4 ms
Lead Channel Setting Sensing Sensitivity: 4 mV
Pulse Gen Model: 2272
Pulse Gen Serial Number: 3879105

## 2021-04-18 ENCOUNTER — Encounter: Payer: Self-pay | Admitting: Cardiology

## 2021-04-18 ENCOUNTER — Ambulatory Visit (INDEPENDENT_AMBULATORY_CARE_PROVIDER_SITE_OTHER): Payer: Medicare (Managed Care) | Admitting: Cardiology

## 2021-04-18 ENCOUNTER — Other Ambulatory Visit: Payer: Self-pay

## 2021-04-18 VITALS — BP 128/70 | HR 80 | Ht 71.0 in | Wt 162.0 lb

## 2021-04-18 DIAGNOSIS — I6300 Cerebral infarction due to thrombosis of unspecified precerebral artery: Secondary | ICD-10-CM | POA: Diagnosis not present

## 2021-04-18 DIAGNOSIS — I10 Essential (primary) hypertension: Secondary | ICD-10-CM | POA: Diagnosis not present

## 2021-04-18 DIAGNOSIS — I442 Atrioventricular block, complete: Secondary | ICD-10-CM

## 2021-04-18 DIAGNOSIS — Z950 Presence of cardiac pacemaker: Secondary | ICD-10-CM | POA: Diagnosis not present

## 2021-04-18 NOTE — Patient Instructions (Addendum)
Medication Instructions:  Your physician recommends that you continue on your current medications as directed. Please refer to the Current Medication list given to you today. *If you need a refill on your cardiac medications before your next appointment, please call your pharmacy*  Lab Work: None ordered. If you have labs (blood work) drawn today and your tests are completely normal, you will receive your results only by: MyChart Message (if you have MyChart) OR A paper copy in the mail If you have any lab test that is abnormal or we need to change your treatment, we will call you to review the results.  Testing/Procedures: None ordered.  Follow-Up: At [facility], you and your health needs are our priority.  As part of our continuing mission to provide you with exceptional heart care, we have created designated Provider Care Teams.  These Care Teams include your primary Cardiologist (physician) and Advanced Practice Providers (APPs -  Physician Assistants and Nurse Practitioners) who all work together to provide you with the care you need, when you need it.  Your next appointment:   Your physician wants you to follow-up in: 9 months with Jordan Prude, MD   Remote monitoring is used to monitor your Pacemaker from home. This monitoring reduces the number of office visits required to check your device to one time per year. It allows [place] to keep an eye on the functioning of your device to ensure it is working properly. You are scheduled for a device check from home on 07/09/2021. You may send your transmission at any time that day. If you have a wireless device, the transmission will be sent automatically. After your physician reviews your transmission, you will receive a postcard with your next transmission date.

## 2021-04-18 NOTE — Progress Notes (Signed)
Electrophysiology Office Follow up Visit Note:    Date:  04/18/2021   ID:  Jordan [name], DOB 04/02/1946, MRN [id number]  PCP:  Casilda Carls, MD   [facility] Electrophysiologist:  Vickie Epley, MD    Interval History:    Jordan [name] is a 75 y.o. male who presents for a follow up visit. They were last seen in clinic July 14, 2020 after a pacemaker implant on April 10, 2020.  Pacemaker was implanted for complete heart block.  The device is a dual-chamber English as a second language teacher.       Past Medical History:  Diagnosis Date   BPH (benign prostatic hyperplasia)    CVA (cerebral vascular accident) ([place]) 10/23/2017   CT confirms subacute R parietal infarct   GERD (gastroesophageal reflux disease)    High cholesterol    Hypertension     Past Surgical History:  Procedure Laterality Date   LEFT HEART CATH AND CORONARY ANGIOGRAPHY N/A 01/28/2020   Procedure: LEFT HEART CATH AND CORONARY ANGIOGRAPHY;  Surgeon: Dionisio David, MD;  Location: [place] CV LAB;  Service: Cardiovascular;  Laterality: N/A;   LOOP RECORDER INSERTION N/A 10/27/2017   Procedure: LOOP RECORDER INSERTION;  Surgeon: Evans Lance, MD;  Location: [place] CV LAB;  Service: Cardiovascular;  Laterality: N/A;   NO PAST SURGERIES     PACEMAKER IMPLANT N/A 04/10/2020   Procedure: PACEMAKER IMPLANT;  Surgeon: Vickie Epley, MD;  Location: [place] CV LAB;  Service: Cardiovascular;  Laterality: N/A;   TEE WITHOUT CARDIOVERSION N/A 10/27/2017   Procedure: TRANSESOPHAGEAL ECHOCARDIOGRAM (TEE);  Surgeon: Fay Records, MD;  Location: [facility] ENDOSCOPY;  Service: Cardiovascular;  Laterality: N/A;   TEMPORARY PACEMAKER N/A 04/07/2020   Procedure: TEMPORARY PACEMAKER;  Surgeon: Martinique, Peter M, MD;  Location: [place] CV LAB;  Service: Cardiovascular;  Laterality: N/A;    Current Medications: Current Meds  Medication Sig   acetaminophen (TYLENOL) 500 MG tablet Take 1,000 mg by mouth every 8 (eight)  hours as needed (pain).   Amino Acids-Protein Hydrolys (FEEDING SUPPLEMENT, PRO-STAT SUGAR FREE 64,) LIQD Take 30 mLs by mouth 2 (two) times daily between meals.   Ascorbic Acid (VITAMIN C) 1000 MG tablet Take 1,000 mg by mouth daily.   blood glucose meter kit and supplies Dispense based on patient and insurance preference. Use up to two times daily  (FOR ICD-10 E10.9, E11.9).   carvedilol (COREG) 6.25 MG tablet Take 1 tablet (6.25 mg total) by mouth 2 (two) times daily with a meal.   Cholecalciferol (VITAMIN D3) 1.25 MG (50000 UT) CAPS Take 1 capsule by mouth every Saturdays   clopidogrel (PLAVIX) 75 MG tablet Take 75 mg by mouth daily.   famotidine (PEPCID) 20 MG tablet Take 20 mg by mouth 2 (two) times daily.   feeding supplement, GLUCERNA SHAKE, (GLUCERNA SHAKE) LIQD Take 237 mLs by mouth 3 (three) times daily between meals.   insulin aspart (NOVOLOG) 100 UNIT/ML injection Inject 10 Units into the skin 2 (two) times daily before a meal. Breakfast and evening meal   LOSARTAN POTASSIUM PO Take 50 mg by mouth daily.   Multiple Vitamin (MULTIVITAMIN WITH MINERALS) TABS tablet Take 1 tablet by mouth daily.   Omega-3 Fatty Acids (FISH OIL) 1200 MG CAPS Take 1,200 mg by mouth daily.    pantoprazole (PROTONIX) 20 MG tablet Take 20 mg by mouth 2 (two) times daily.   rosuvastatin (CRESTOR) 20 MG tablet Take 1 tablet (20 mg total) by mouth  daily.   senna (SENOKOT) 8.6 MG TABS tablet Take 1 tablet by mouth daily as needed for mild constipation.   tamsulosin (FLOMAX) 0.4 MG CAPS capsule Take 0.4 mg by mouth daily.     Allergies:   Patient has no known allergies.   Social History   Socioeconomic History   Marital status: Divorced    Spouse name: Not on file   Number of children: Not on file   Years of education: Not on file   Highest education level: Not on file  Occupational History   Not on file  Tobacco Use   Smoking status: Former    Packs/day: 0.12    Years: 10.00    Pack years: 1.20     Types: Cigarettes, Pipe    Quit date: 15    Years since quitting: 33.0   Smokeless tobacco: Never  Vaping Use   Vaping Use: Never used  Substance and Sexual Activity   Alcohol use: Never   Drug use: Never   Sexual activity: Not on file  Other Topics Concern   Not on file  Social History Narrative   Not on file   Social Determinants of Health   Financial Resource Strain: Not on file  Food Insecurity: Not on file  Transportation Needs: Not on file  Physical Activity: Not on file  Stress: Not on file  Social Connections: Not on file     Family History: The patient's family history includes CVA in his father; CVA (age of onset: 62) in his mother; Heart attack in his father; Stroke in his father and mother.  ROS:   Please see the history of present illness.    All other systems reviewed and are negative.  EKGs/Labs/Other Studies Reviewed:    The following studies were reviewed today:  April 18, 2021 in clinic device interrogation personally reviewed Lead parameters are stable Ventricular pacing greater than 99% Atrially pacing less than 1% Mode switch episodes reviewed and show brief paroxysms of atrial tachycardia.  No sustained atrial arrhythmias.  EKG:  The ekg ordered today demonstrates sinus rhythm with ventricular pacing  Recent Labs: No results found for requested labs within last 8760 hours.  Recent Lipid Panel    Component Value Date/Time   CHOL 123 10/24/2017 0357   TRIG 80 10/24/2017 0357   HDL 32 (L) 10/24/2017 0357   CHOLHDL 3.8 10/24/2017 0357   VLDL 16 10/24/2017 0357   LDLCALC 75 10/24/2017 0357    Physical Exam:    VS:  BP 128/70    Pulse 80    Ht '5\' 11"'  (1.803 m)    Wt 162 lb (73.5 kg)    SpO2 96%    BMI 22.59 kg/m     Wt Readings from Last 3 Encounters:  04/18/21 162 lb (73.5 kg)  07/14/20 147 lb 6.4 oz (66.9 kg)  04/27/20 164 lb (74.4 kg)     GEN:  Well nourished, well developed in no acute distress HEENT: Normal NECK: No JVD;  No carotid bruits LYMPHATICS: No lymphadenopathy CARDIAC: RRR, no murmurs, rubs, gallops.  Pacemaker pocket well-healed RESPIRATORY:  Clear to auscultation without rales, wheezing or rhonchi  ABDOMEN: Soft, non-tender, non-distended MUSCULOSKELETAL:  No edema; No deformity  SKIN: Warm and dry NEUROLOGIC:  Alert and oriented x 3 PSYCHIATRIC:  Normal affect        ASSESSMENT:    1. Heart block AV complete ([place])   2. Pacemaker   3. Cerebrovascular accident (CVA) due to thrombosis of precerebral  artery (HCC)    PLAN:    In order of problems listed above:  #Complete heart block post permanent pacemaker Device functioning appropriately.  Continue remote monitoring. Follow-up with me in 1 year or sooner as needed.  #History of stroke  #Hypertension Controlled Recommend he check his blood pressures 1-2 times per week and follow-up with primary care.  Continue Coreg.  Medication Adjustments/Labs and Tests Ordered: Current medicines are reviewed at length with the patient today.  Concerns regarding medicines are outlined above.  Orders Placed This Encounter  Procedures   EKG 12-Lead   No orders of the defined types were placed in this encounter.    Signed, Lars Mage, MD, [facility], [facility] 04/18/2021 10:18 PM    Electrophysiology [facility]

## 2021-04-18 NOTE — Progress Notes (Signed)
Remote pacemaker transmission.   

## 2021-05-16 NOTE — Progress Notes (Unsigned)
This encounter was created in error - please disregard.

## 2021-07-09 ENCOUNTER — Ambulatory Visit (INDEPENDENT_AMBULATORY_CARE_PROVIDER_SITE_OTHER): Payer: Medicare (Managed Care)

## 2021-07-09 DIAGNOSIS — I442 Atrioventricular block, complete: Secondary | ICD-10-CM | POA: Diagnosis not present

## 2021-07-09 LAB — CUP PACEART REMOTE DEVICE CHECK
Battery Remaining Longevity: 108 mo
Battery Remaining Percentage: 90 %
Battery Voltage: 2.99 V
Brady Statistic AP VP Percent: 1 %
Brady Statistic AP VS Percent: 1 %
Brady Statistic AS VP Percent: 99 %
Brady Statistic AS VS Percent: 1 %
Brady Statistic RA Percent Paced: 1 %
Brady Statistic RV Percent Paced: 99 %
Date Time Interrogation Session: 20230320020013
Implantable Lead Implant Date: 20211220
Implantable Lead Implant Date: 20211220
Implantable Lead Location: 753859
Implantable Lead Location: 753860
Implantable Pulse Generator Implant Date: 20211220
Lead Channel Impedance Value: 440 Ohm
Lead Channel Impedance Value: 480 Ohm
Lead Channel Pacing Threshold Amplitude: 0.5 V
Lead Channel Pacing Threshold Amplitude: 0.875 V
Lead Channel Pacing Threshold Pulse Width: 0.4 ms
Lead Channel Pacing Threshold Pulse Width: 0.4 ms
Lead Channel Sensing Intrinsic Amplitude: 12 mV
Lead Channel Sensing Intrinsic Amplitude: 5 mV
Lead Channel Setting Pacing Amplitude: 1.125
Lead Channel Setting Pacing Amplitude: 2 V
Lead Channel Setting Pacing Pulse Width: 0.4 ms
Lead Channel Setting Sensing Sensitivity: 4 mV
Pulse Gen Model: 2272
Pulse Gen Serial Number: 3879105

## 2021-07-18 NOTE — Progress Notes (Signed)
Remote pacemaker transmission.   

## 2021-10-05 ENCOUNTER — Ambulatory Visit
Admission: RE | Admit: 2021-10-05 | Discharge: 2021-10-05 | Disposition: A | Discharge status: Home / Self Care | Payer: Medicare (Managed Care) | Source: Ambulatory Visit | Attending: Family Medicine | Admitting: Family Medicine

## 2021-10-05 ENCOUNTER — Other Ambulatory Visit: Payer: Self-pay | Admitting: Family Medicine

## 2021-10-05 DIAGNOSIS — R7612 Nonspecific reaction to cell mediated immunity measurement of gamma interferon antigen response without active tuberculosis: Secondary | ICD-10-CM

## 2021-10-08 ENCOUNTER — Ambulatory Visit (INDEPENDENT_AMBULATORY_CARE_PROVIDER_SITE_OTHER): Payer: Medicare (Managed Care)

## 2021-10-08 DIAGNOSIS — I442 Atrioventricular block, complete: Secondary | ICD-10-CM

## 2021-10-09 LAB — CUP PACEART REMOTE DEVICE CHECK
Battery Remaining Longevity: 106 mo
Battery Remaining Percentage: 87 %
Battery Voltage: 2.99 V
Brady Statistic AP VP Percent: 1 %
Brady Statistic AP VS Percent: 1 %
Brady Statistic AS VP Percent: 99 %
Brady Statistic AS VS Percent: 1 %
Brady Statistic RA Percent Paced: 1 %
Brady Statistic RV Percent Paced: 99 %
Date Time Interrogation Session: 20230619020014
Implantable Lead Implant Date: 20211220
Implantable Lead Implant Date: 20211220
Implantable Lead Location: 753859
Implantable Lead Location: 753860
Implantable Pulse Generator Implant Date: 20211220
Lead Channel Impedance Value: 430 Ohm
Lead Channel Impedance Value: 480 Ohm
Lead Channel Pacing Threshold Amplitude: 0.5 V
Lead Channel Pacing Threshold Amplitude: 1 V
Lead Channel Pacing Threshold Pulse Width: 0.4 ms
Lead Channel Pacing Threshold Pulse Width: 0.4 ms
Lead Channel Sensing Intrinsic Amplitude: 12 mV
Lead Channel Sensing Intrinsic Amplitude: 5 mV
Lead Channel Setting Pacing Amplitude: 1.25 V
Lead Channel Setting Pacing Amplitude: 2 V
Lead Channel Setting Pacing Pulse Width: 0.4 ms
Lead Channel Setting Sensing Sensitivity: 4 mV
Pulse Gen Model: 2272
Pulse Gen Serial Number: 3879105

## 2021-10-26 NOTE — Progress Notes (Signed)
Remote pacemaker transmission.   

## 2022-01-07 ENCOUNTER — Ambulatory Visit (INDEPENDENT_AMBULATORY_CARE_PROVIDER_SITE_OTHER): Payer: Medicare (Managed Care)

## 2022-01-07 DIAGNOSIS — I442 Atrioventricular block, complete: Secondary | ICD-10-CM | POA: Diagnosis not present

## 2022-01-08 LAB — CUP PACEART REMOTE DEVICE CHECK
Battery Remaining Longevity: 102 mo
Battery Remaining Percentage: 85 %
Battery Voltage: 2.99 V
Brady Statistic AP VP Percent: 1 %
Brady Statistic AP VS Percent: 1 %
Brady Statistic AS VP Percent: 99 %
Brady Statistic AS VS Percent: 1 %
Brady Statistic RA Percent Paced: 1 %
Brady Statistic RV Percent Paced: 99 %
Date Time Interrogation Session: 20230918020013
Implantable Lead Implant Date: 20211220
Implantable Lead Implant Date: 20211220
Implantable Lead Location: 753859
Implantable Lead Location: 753860
Implantable Pulse Generator Implant Date: 20211220
Lead Channel Impedance Value: 390 Ohm
Lead Channel Impedance Value: 460 Ohm
Lead Channel Pacing Threshold Amplitude: 0.5 V
Lead Channel Pacing Threshold Amplitude: 1 V
Lead Channel Pacing Threshold Pulse Width: 0.4 ms
Lead Channel Pacing Threshold Pulse Width: 0.4 ms
Lead Channel Sensing Intrinsic Amplitude: 12 mV
Lead Channel Sensing Intrinsic Amplitude: 5 mV
Lead Channel Setting Pacing Amplitude: 1.25 V
Lead Channel Setting Pacing Amplitude: 2 V
Lead Channel Setting Pacing Pulse Width: 0.4 ms
Lead Channel Setting Sensing Sensitivity: 4 mV
Pulse Gen Model: 2272
Pulse Gen Serial Number: 3879105

## 2022-01-19 NOTE — Progress Notes (Signed)
Remote pacemaker transmission.   

## 2022-03-05 ENCOUNTER — Encounter: Payer: Medicare (Managed Care) | Admitting: Cardiology

## 2022-04-08 ENCOUNTER — Ambulatory Visit (INDEPENDENT_AMBULATORY_CARE_PROVIDER_SITE_OTHER): Payer: Medicare (Managed Care)

## 2022-04-08 DIAGNOSIS — I442 Atrioventricular block, complete: Secondary | ICD-10-CM

## 2022-04-09 LAB — CUP PACEART REMOTE DEVICE CHECK
Battery Remaining Longevity: 103 mo
Battery Remaining Percentage: 82 %
Battery Voltage: 2.99 V
Brady Statistic AP VP Percent: 1 %
Brady Statistic AP VS Percent: 1 %
Brady Statistic AS VP Percent: 99 %
Brady Statistic AS VS Percent: 1 %
Brady Statistic RA Percent Paced: 1 %
Brady Statistic RV Percent Paced: 99 %
Date Time Interrogation Session: 20231218020014
Implantable Lead Connection Status: 753985
Implantable Lead Connection Status: 753985
Implantable Lead Implant Date: 20211220
Implantable Lead Implant Date: 20211220
Implantable Lead Location: 753859
Implantable Lead Location: 753860
Implantable Pulse Generator Implant Date: 20211220
Lead Channel Impedance Value: 410 Ohm
Lead Channel Impedance Value: 530 Ohm
Lead Channel Pacing Threshold Amplitude: 0.5 V
Lead Channel Pacing Threshold Amplitude: 0.875 V
Lead Channel Pacing Threshold Pulse Width: 0.4 ms
Lead Channel Pacing Threshold Pulse Width: 0.4 ms
Lead Channel Sensing Intrinsic Amplitude: 12 mV
Lead Channel Sensing Intrinsic Amplitude: 5 mV
Lead Channel Setting Pacing Amplitude: 1.125
Lead Channel Setting Pacing Amplitude: 2 V
Lead Channel Setting Pacing Pulse Width: 0.4 ms
Lead Channel Setting Sensing Sensitivity: 4 mV
Pulse Gen Model: 2272
Pulse Gen Serial Number: 3879105

## 2022-05-13 NOTE — Progress Notes (Signed)
Remote pacemaker transmission.   

## 2022-06-05 IMAGING — DX DG CHEST 1V PORT SAME DAY
1 series · 1 of 1 positions shown · non-contrast
Comparison: April 07, 2020

CLINICAL DATA: Confirm pacer placement.

EXAM:
PORTABLE CHEST 1 VIEW

[chest]
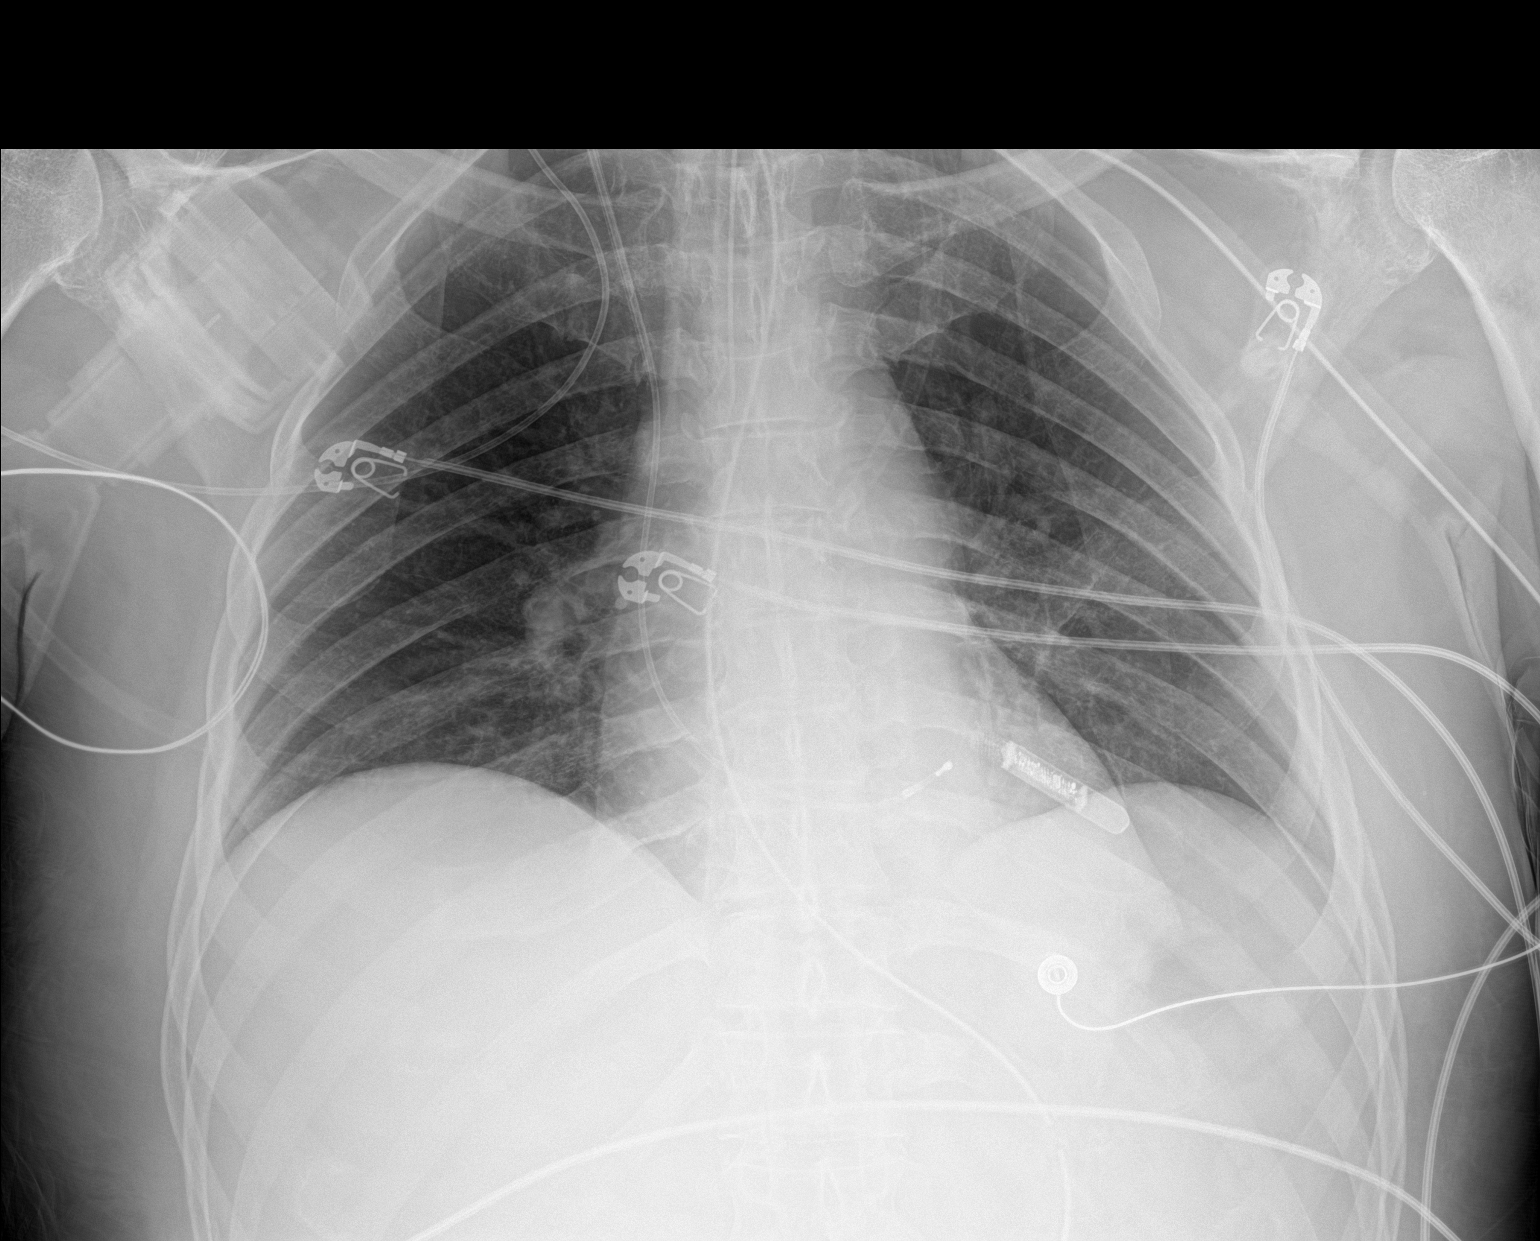

[1 of 1 positions shown; findings below may reference images not displayed]

FINDINGS: There is stable endotracheal tube and nasogastric tube positioning.
Interval right internal jugular cardiac pacer lead placement is seen
with appropriate lead tip positioning seen overlying the left lung
base. The heart size and mediastinal contours are within normal
limits. Both lungs are clear. The visualized skeletal structures are
unremarkable.
IMPRESSION: Interval right internal jugular cardiac pacer placement positioning,
as described above, without evidence of acute or active
cardiopulmonary disease.

## 2022-06-05 IMAGING — DX DG ABD PORTABLE 1V
1 series · 1 of 1 positions shown · non-contrast
Comparison: None.

CLINICAL DATA: Unresponsive.  Enteric tube placement.

EXAM:
PORTABLE ABDOMEN - 1 VIEW

[abdomen kub]
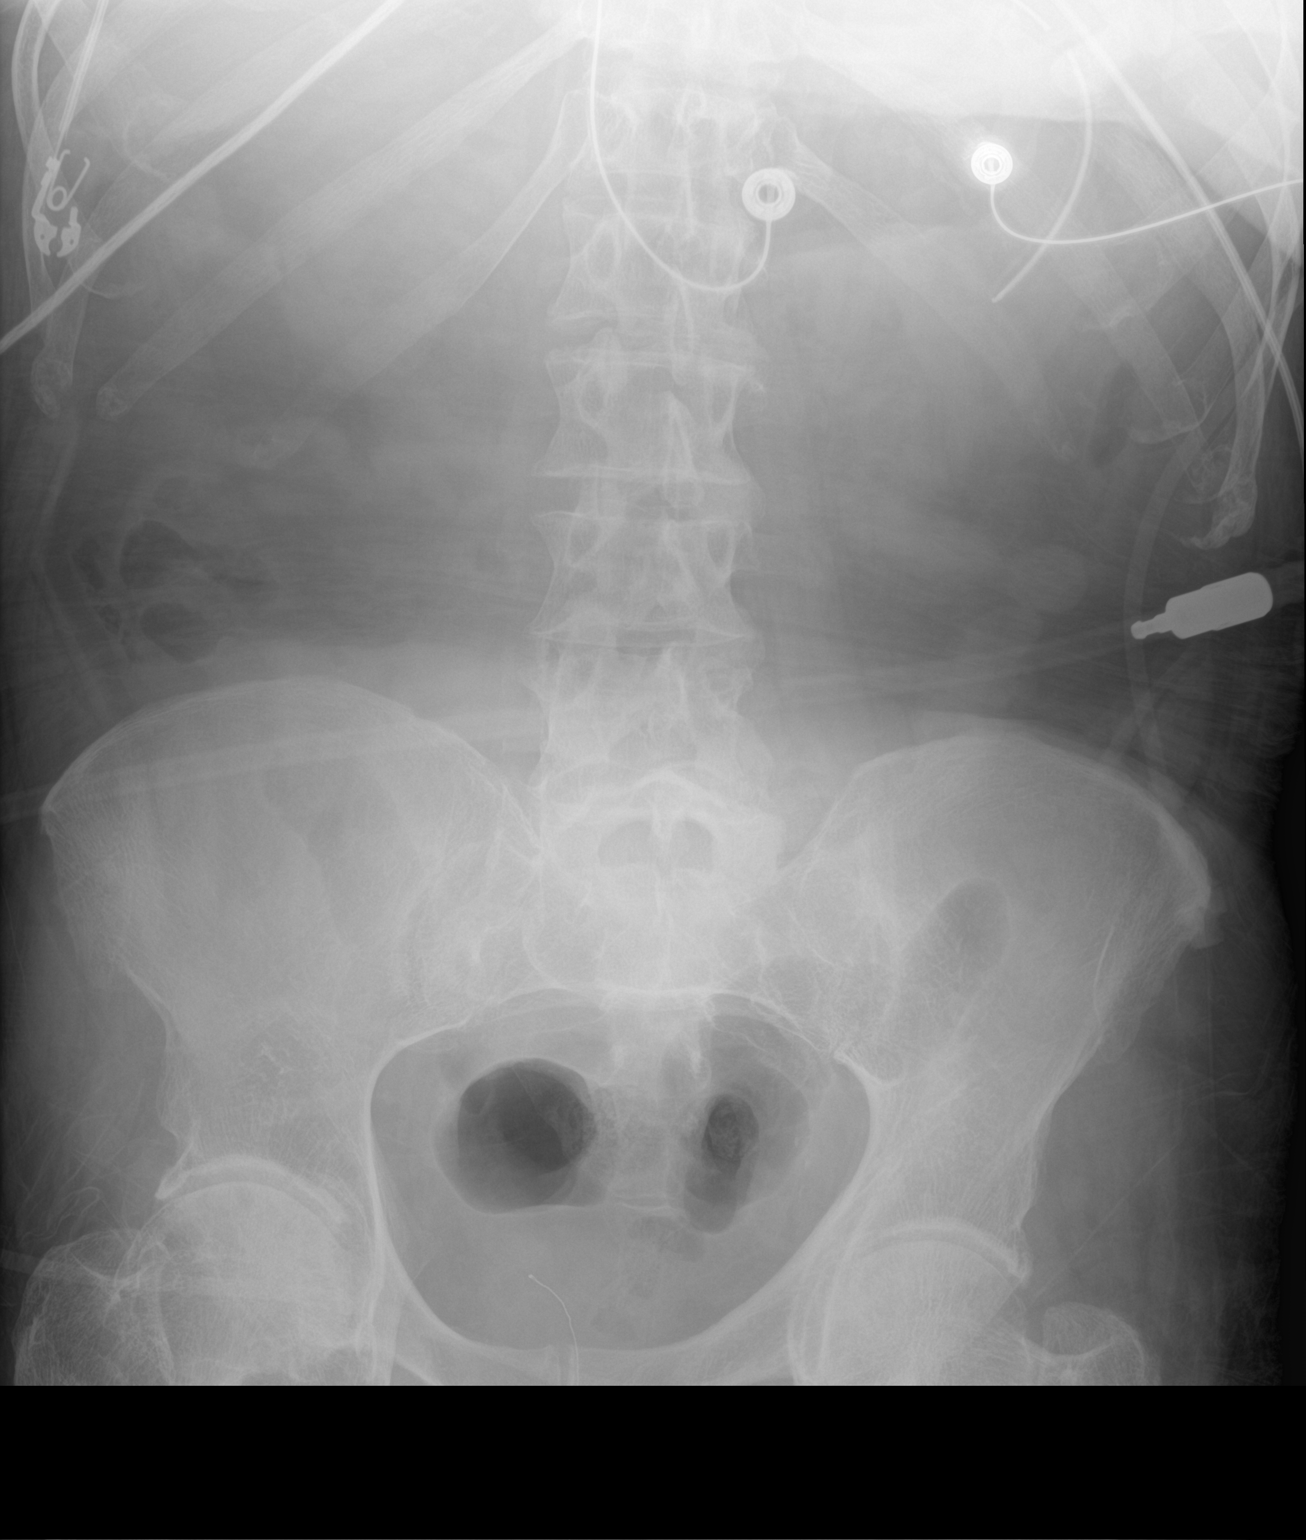

[1 of 1 positions shown; findings below may reference images not displayed]

FINDINGS: Enteric tube in the stomach. The bowel gas pattern is normal. No
radio-opaque calculi or other significant radiographic abnormality
are seen. No acute osseous abnormality. Bilateral hip
osteoarthritis.
IMPRESSION: 1. Enteric tube in the stomach.

## 2022-06-05 IMAGING — CT CT CERVICAL SPINE W/O CM
3 of 4 series · 10 of 33 positions shown, 12 images · non-contrast
Comparison: None.

CLINICAL DATA: 74-year-old male with a history of neurologic
deficit

EXAM:
CT CERVICAL SPINE WITHOUT CONTRAST
TECHNIQUE: Multidetector CT imaging of the cervical spine was performed without
intravenous contrast. Multiplanar CT image reconstructions were also
generated.

[Series 8: sag bone · sagittal · 0.25mm/px · 5 of 73 slices shown, 6 images]
[im 25/73  bone]
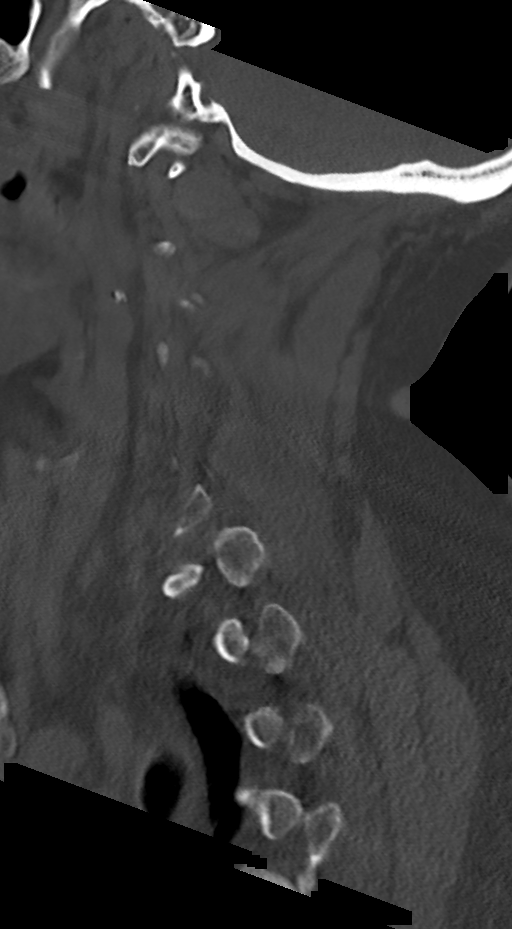
[im 31/73  bone]
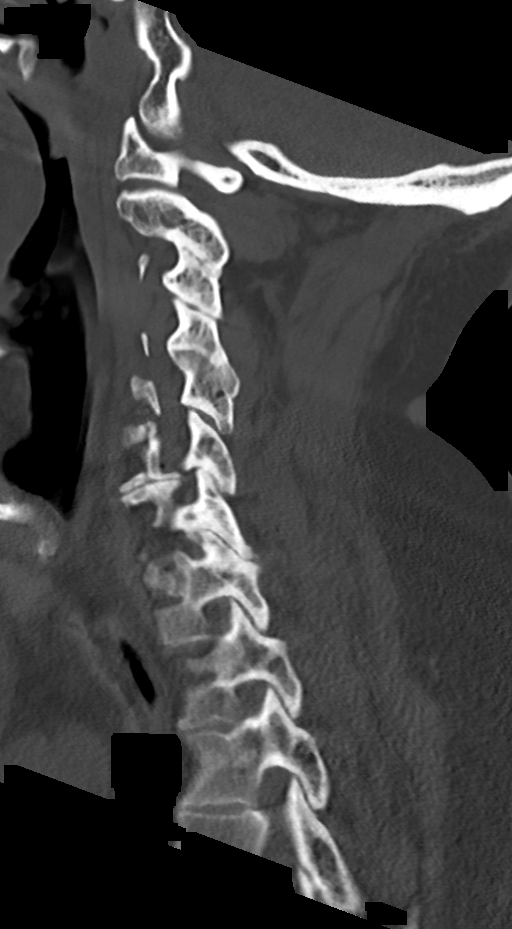
[im 37/73  soft-tissue]
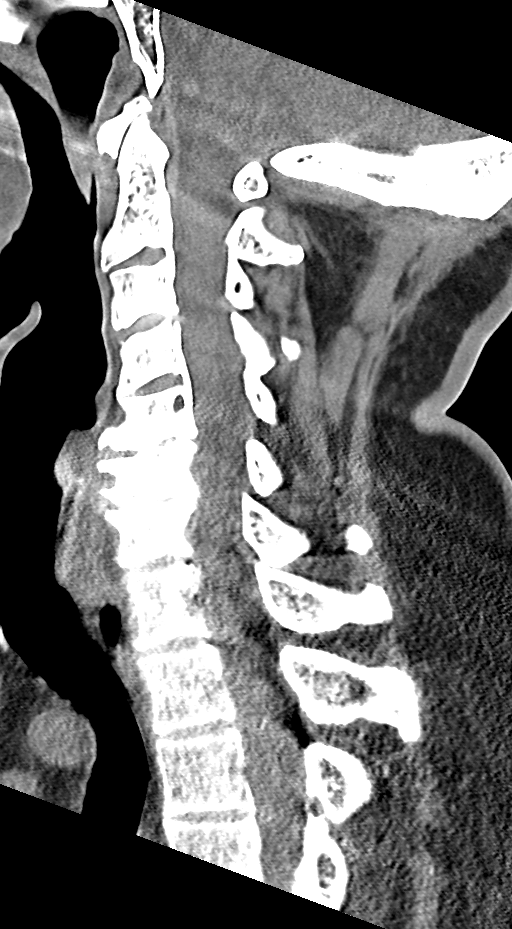
[im 37/73  bone]
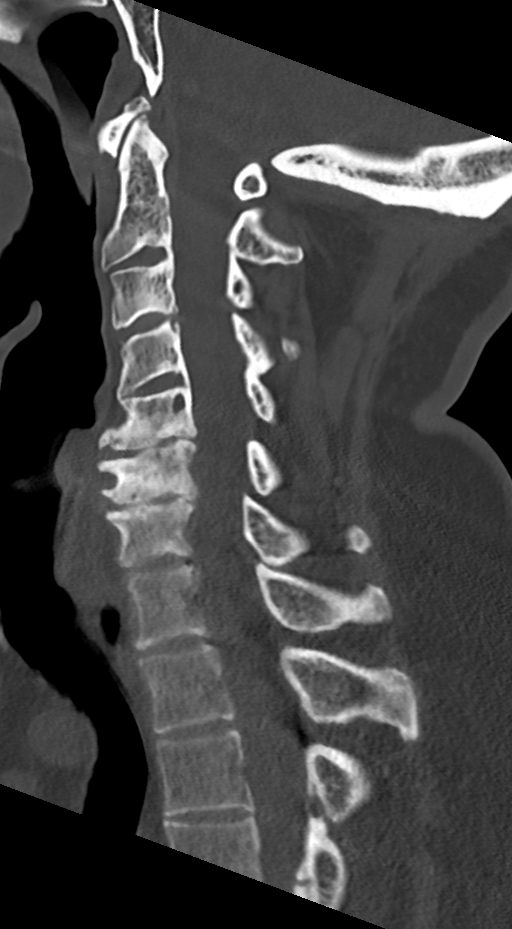
[im 43/73  bone]
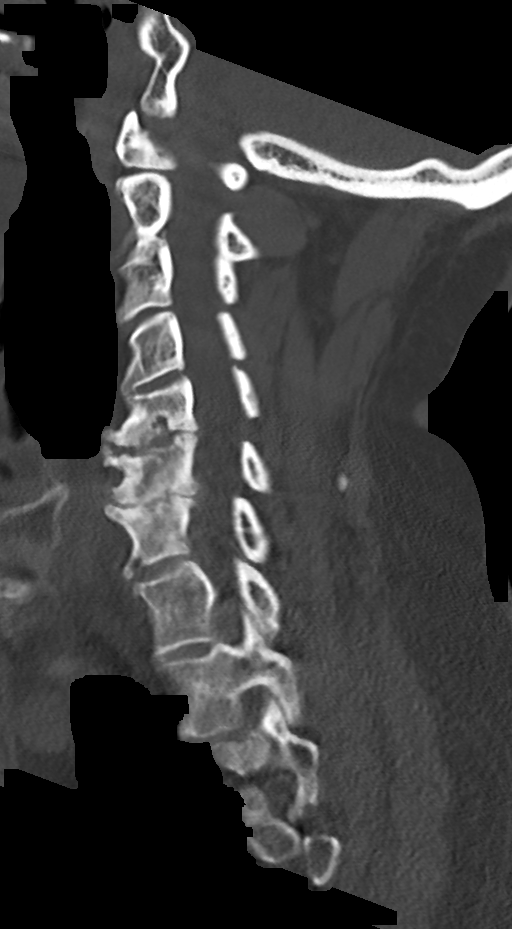
[im 49/73  bone]
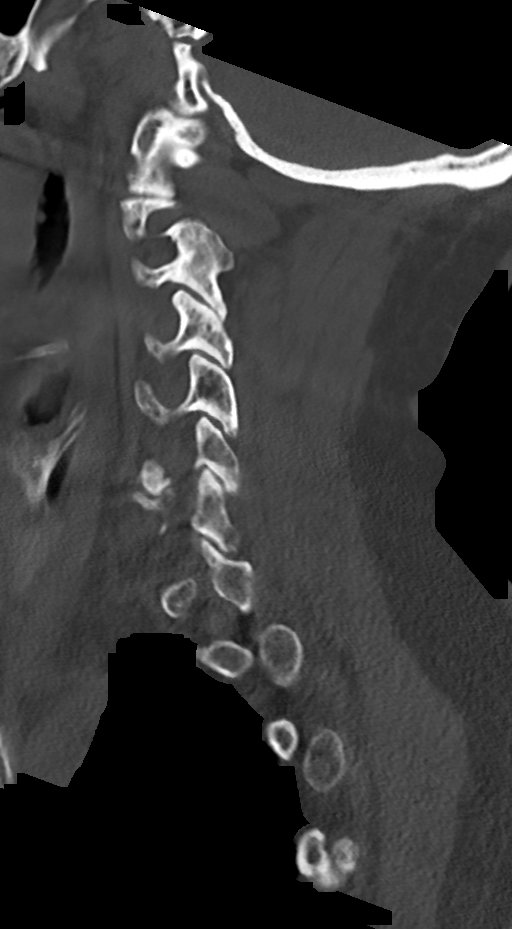

[Series 9: cor bone · coronal · 0.25mm/px · 3 of 65 slices shown]
[im 13/65  bone]
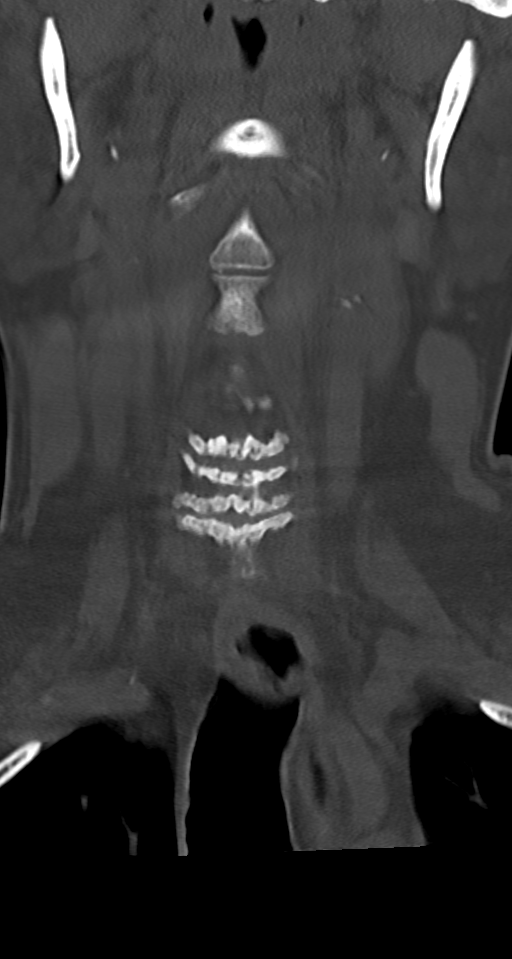
[im 26/65  bone]
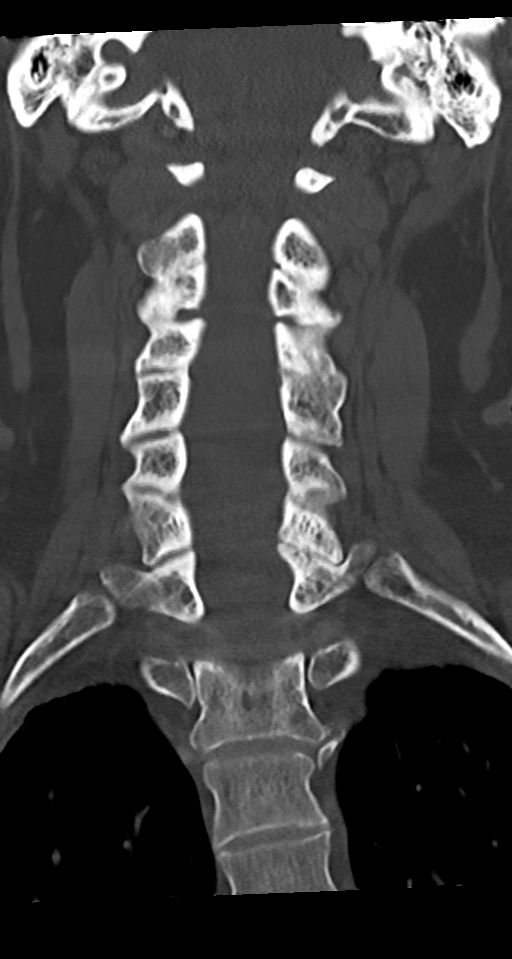
[im 39/65  bone]
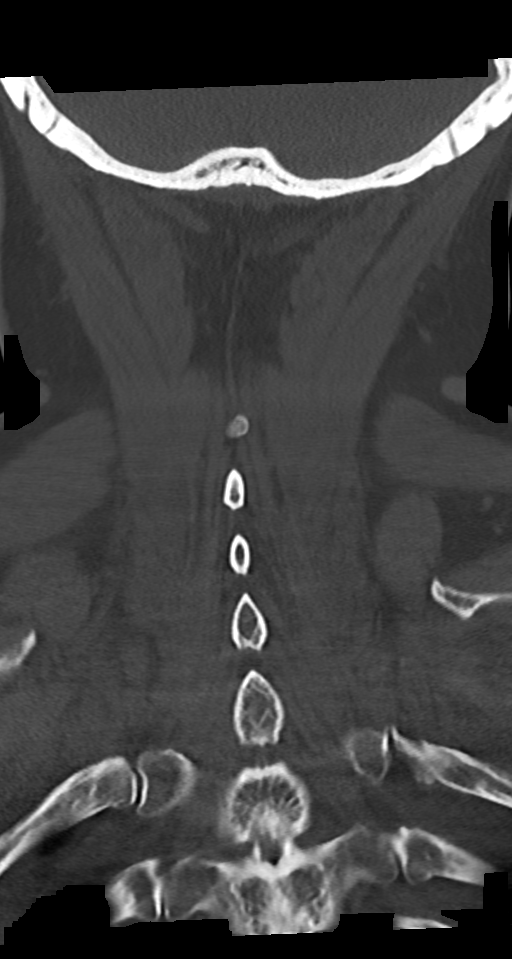

[Series 10: orthogonal axials · axial · 0.21mm/px · z∈[-182,-117]mm · 2 of 98 slices shown, 3 images]
[im 33/98  soft-tissue]
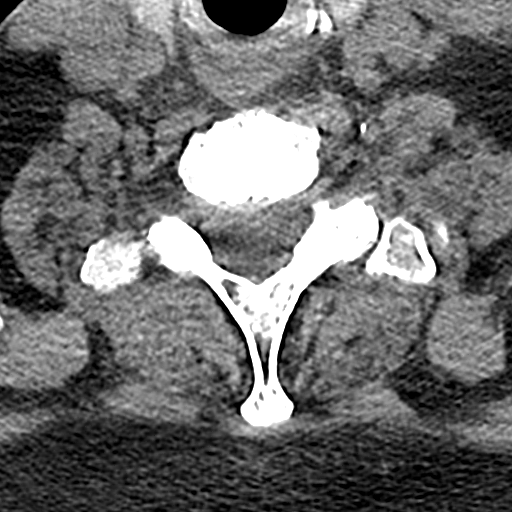
[im 33/98  bone]
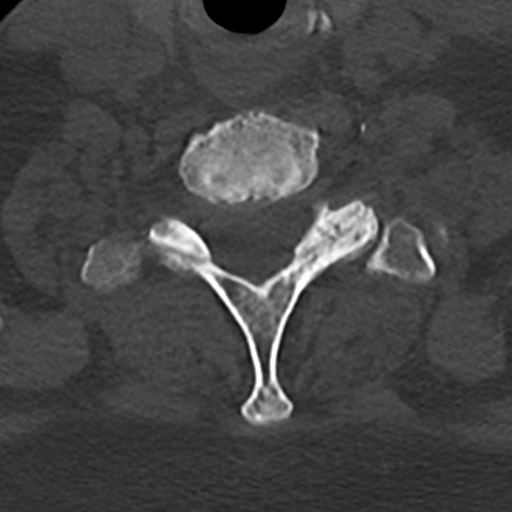
[im 65/98  bone]
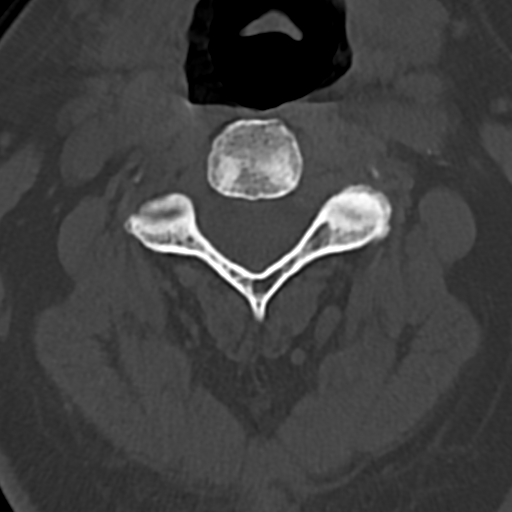

[10 of 33 positions shown; findings below may reference images not displayed]

FINDINGS: Alignment: Craniocervical junction aligned. Anatomic alignment of
the cervical elements. No subluxation.

Skull base and vertebrae: No acute fracture at the skullbase.
Vertebral body heights relatively maintained. No acute fracture
identified.

Soft tissues and spinal canal: Unremarkable cervical soft tissues.
Lymph nodes are present, though not enlarged.

Disc levels: Disc space narrowing at C5-C6 and C6-C7 with endplate
sclerosis, anterior osteophyte production, and uncovertebral joint
disease.

Less degree of joint space narrowing at C4-C5.

Mild right foraminal narrowing at C6-C7 secondary to uncovertebral
joint disease.

Ankylosis of the right greater than left facets at C2-C3. Left-sided
facet ankylosis at C4-C5. No significant bony canal narrowing.

Upper chest: Unremarkable appearance of the lung apices.

Other: No bony canal narrowing.
IMPRESSION: Negative for acute fracture or malalignment of the cervical spine.

Degenerative changes as above.

## 2022-07-08 ENCOUNTER — Ambulatory Visit (INDEPENDENT_AMBULATORY_CARE_PROVIDER_SITE_OTHER): Payer: Medicare (Managed Care)

## 2022-07-08 DIAGNOSIS — I442 Atrioventricular block, complete: Secondary | ICD-10-CM

## 2022-07-09 LAB — CUP PACEART REMOTE DEVICE CHECK
Battery Remaining Longevity: 98 mo
Battery Remaining Percentage: 80 %
Battery Voltage: 2.99 V
Brady Statistic AP VP Percent: 4 %
Brady Statistic AP VS Percent: 1 %
Brady Statistic AS VP Percent: 96 %
Brady Statistic AS VS Percent: 1 %
Brady Statistic RA Percent Paced: 4 %
Brady Statistic RV Percent Paced: 99 %
Date Time Interrogation Session: 20240318020015
Implantable Lead Connection Status: 753985
Implantable Lead Connection Status: 753985
Implantable Lead Implant Date: 20211220
Implantable Lead Implant Date: 20211220
Implantable Lead Location: 753859
Implantable Lead Location: 753860
Implantable Pulse Generator Implant Date: 20211220
Lead Channel Impedance Value: 400 Ohm
Lead Channel Impedance Value: 530 Ohm
Lead Channel Pacing Threshold Amplitude: 0.5 V
Lead Channel Pacing Threshold Amplitude: 0.875 V
Lead Channel Pacing Threshold Pulse Width: 0.4 ms
Lead Channel Pacing Threshold Pulse Width: 0.4 ms
Lead Channel Sensing Intrinsic Amplitude: 12 mV
Lead Channel Sensing Intrinsic Amplitude: 5 mV
Lead Channel Setting Pacing Amplitude: 1.125
Lead Channel Setting Pacing Amplitude: 2 V
Lead Channel Setting Pacing Pulse Width: 0.4 ms
Lead Channel Setting Sensing Sensitivity: 4 mV
Pulse Gen Model: 2272
Pulse Gen Serial Number: 3879105

## 2022-08-16 NOTE — Progress Notes (Signed)
Remote pacemaker transmission.   

## 2022-09-26 NOTE — Progress Notes (Signed)
  Electrophysiology Office Follow up Visit Note:    Date:  09/27/2022   ID:  Jordan [name], DOB 01-Dec-1945, MRN [id number]  PCP:  Sherrie Mustache, MD  [facility] Cardiologist:  None  [facility] Electrophysiologist:  Lanier Prude, MD    Interval History:    Jordan [name] is a 77 y.o. male who presents for a follow up visit.   Last seen 04/18/2021.  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  He has been doing well since I last saw him.  He keeps up with college basketball closely.      Past medical, surgical, social and family history were reviewed.  ROS:   Please see the history of present illness.    All other systems reviewed and are negative.  EKGs/Labs/Other Studies Reviewed:    The following studies were reviewed today:  09/27/2022 in clinic device interrogation personally reviewed. Battery longevity 8.1 years Lead parameter stable Presenting rhythm is in a sensed, V paced rhythm at 30 Atrial pacing 6.8% Ventricular pacing greater than 9%     Physical Exam:    VS:  BP 136/72   Pulse 69   Ht 5\' 9"  (1.753 m)   Wt 173 lb 9.6 oz (78.7 kg)   SpO2 99%   BMI 25.64 kg/m     Wt Readings from Last 3 Encounters:  09/27/22 173 lb 9.6 oz (78.7 kg)  04/18/21 162 lb (73.5 kg)  07/14/20 147 lb 6.4 oz (66.9 kg)     GEN:  Well nourished, well developed in no acute distress CARDIAC: RRR, no murmurs, rubs, gallops. Pocket well healed. RESPIRATORY:  Clear to auscultation without rales, wheezing or rhonchi       ASSESSMENT:    1. Heart block AV complete (HCC)   2. Pacemaker   3. Primary hypertension    PLAN:    In order of problems listed above:  #CHB #PPM in situ Device functioning appropriately. Continue remote monitoring.  #Hypertension At goal today.  Recommend checking blood pressures 1-2 times per week at home and recording the values.  Recommend bringing these recordings to the primary care physician.   Follow up 1 year with  APP.    Signed, Steffanie Dunn, MD, [facility], [facility] 09/27/2022 3:03 PM    Electrophysiology [facility]

## 2022-09-27 ENCOUNTER — Encounter: Payer: Self-pay | Admitting: Cardiology

## 2022-09-27 ENCOUNTER — Ambulatory Visit: Payer: Medicare (Managed Care) | Attending: Cardiology | Admitting: Cardiology

## 2022-09-27 VITALS — BP 136/72 | HR 69 | Ht 69.0 in | Wt 173.6 lb

## 2022-09-27 DIAGNOSIS — I10 Essential (primary) hypertension: Secondary | ICD-10-CM | POA: Diagnosis not present

## 2022-09-27 DIAGNOSIS — Z950 Presence of cardiac pacemaker: Secondary | ICD-10-CM | POA: Diagnosis not present

## 2022-09-27 DIAGNOSIS — I442 Atrioventricular block, complete: Secondary | ICD-10-CM | POA: Diagnosis not present

## 2022-09-27 LAB — CUP PACEART INCLINIC DEVICE CHECK
Battery Remaining Longevity: 97 mo
Battery Voltage: 2.99 V
Brady Statistic RA Percent Paced: 6.8 %
Brady Statistic RV Percent Paced: 99.96 %
Date Time Interrogation Session: 20240607162919
Implantable Lead Connection Status: 753985
Implantable Lead Connection Status: 753985
Implantable Lead Implant Date: 20211220
Implantable Lead Implant Date: 20211220
Implantable Lead Location: 753859
Implantable Lead Location: 753860
Implantable Pulse Generator Implant Date: 20211220
Lead Channel Impedance Value: 425 Ohm
Lead Channel Impedance Value: 550 Ohm
Lead Channel Pacing Threshold Amplitude: 0.75 V
Lead Channel Pacing Threshold Amplitude: 0.75 V
Lead Channel Pacing Threshold Amplitude: 0.75 V
Lead Channel Pacing Threshold Amplitude: 0.75 V
Lead Channel Pacing Threshold Pulse Width: 0.4 ms
Lead Channel Pacing Threshold Pulse Width: 0.4 ms
Lead Channel Pacing Threshold Pulse Width: 0.4 ms
Lead Channel Pacing Threshold Pulse Width: 0.4 ms
Lead Channel Sensing Intrinsic Amplitude: 12 mV
Lead Channel Sensing Intrinsic Amplitude: 5 mV
Lead Channel Setting Pacing Amplitude: 1 V
Lead Channel Setting Pacing Amplitude: 2 V
Lead Channel Setting Pacing Pulse Width: 0.4 ms
Lead Channel Setting Sensing Sensitivity: 4 mV
Pulse Gen Model: 2272
Pulse Gen Serial Number: 3879105

## 2022-09-27 NOTE — Patient Instructions (Signed)
Medication Instructions:  Your physician recommends that you continue on your current medications as directed. Please refer to the Current Medication list given to you today.  *If you need a refill on your cardiac medications before your next appointment, please call your pharmacy*  Follow-Up: At [facility], you and your health needs are our priority.  As part of our continuing mission to provide you with exceptional heart care, we have created designated Provider Care Teams.  These Care Teams include your primary Cardiologist (physician) and Advanced Practice Providers (APPs -  Physician Assistants and Nurse Practitioners) who all work together to provide you with the care you need, when you need it.  Your next appointment:   1 year(s)  Provider:   You will see one of the following Advanced Practice Providers on your designated Care Team:   Renee Ursuy, PA-C Michael "Andy" Tillery, PA-C Suzann Riddle, NP  

## 2022-10-07 ENCOUNTER — Ambulatory Visit (INDEPENDENT_AMBULATORY_CARE_PROVIDER_SITE_OTHER): Payer: Medicare (Managed Care)

## 2022-10-07 DIAGNOSIS — I442 Atrioventricular block, complete: Secondary | ICD-10-CM | POA: Diagnosis not present

## 2022-10-08 LAB — CUP PACEART REMOTE DEVICE CHECK
Battery Remaining Longevity: 95 mo
Battery Remaining Percentage: 77 %
Battery Voltage: 2.99 V
Brady Statistic AP VP Percent: 28 %
Brady Statistic AP VS Percent: 1 %
Brady Statistic AS VP Percent: 72 %
Brady Statistic AS VS Percent: 1 %
Brady Statistic RA Percent Paced: 28 %
Brady Statistic RV Percent Paced: 99 %
Date Time Interrogation Session: 20240617020015
Implantable Lead Connection Status: 753985
Implantable Lead Connection Status: 753985
Implantable Lead Implant Date: 20211220
Implantable Lead Implant Date: 20211220
Implantable Lead Location: 753859
Implantable Lead Location: 753860
Implantable Pulse Generator Implant Date: 20211220
Lead Channel Impedance Value: 390 Ohm
Lead Channel Impedance Value: 540 Ohm
Lead Channel Pacing Threshold Amplitude: 0.75 V
Lead Channel Pacing Threshold Amplitude: 1 V
Lead Channel Pacing Threshold Pulse Width: 0.4 ms
Lead Channel Pacing Threshold Pulse Width: 0.4 ms
Lead Channel Sensing Intrinsic Amplitude: 12 mV
Lead Channel Sensing Intrinsic Amplitude: 5 mV
Lead Channel Setting Pacing Amplitude: 1.25 V
Lead Channel Setting Pacing Amplitude: 2 V
Lead Channel Setting Pacing Pulse Width: 0.4 ms
Lead Channel Setting Sensing Sensitivity: 4 mV
Pulse Gen Model: 2272
Pulse Gen Serial Number: 3879105

## 2022-10-28 NOTE — Progress Notes (Signed)
Remote pacemaker transmission.   

## 2023-01-06 ENCOUNTER — Ambulatory Visit (INDEPENDENT_AMBULATORY_CARE_PROVIDER_SITE_OTHER): Payer: Medicare (Managed Care)

## 2023-01-06 DIAGNOSIS — I442 Atrioventricular block, complete: Secondary | ICD-10-CM | POA: Diagnosis not present

## 2023-01-07 LAB — CUP PACEART REMOTE DEVICE CHECK
Battery Remaining Longevity: 92 mo
Battery Remaining Percentage: 75 %
Battery Voltage: 2.99 V
Brady Statistic AP VP Percent: 24 %
Brady Statistic AP VS Percent: 1 %
Brady Statistic AS VP Percent: 76 %
Brady Statistic AS VS Percent: 1 %
Brady Statistic RA Percent Paced: 24 %
Brady Statistic RV Percent Paced: 99 %
Date Time Interrogation Session: 20240916020026
Implantable Lead Connection Status: 753985
Implantable Lead Connection Status: 753985
Implantable Lead Implant Date: 20211220
Implantable Lead Implant Date: 20211220
Implantable Lead Location: 753859
Implantable Lead Location: 753860
Implantable Pulse Generator Implant Date: 20211220
Lead Channel Impedance Value: 390 Ohm
Lead Channel Impedance Value: 550 Ohm
Lead Channel Pacing Threshold Amplitude: 0.75 V
Lead Channel Pacing Threshold Amplitude: 0.875 V
Lead Channel Pacing Threshold Pulse Width: 0.4 ms
Lead Channel Pacing Threshold Pulse Width: 0.4 ms
Lead Channel Sensing Intrinsic Amplitude: 12 mV
Lead Channel Sensing Intrinsic Amplitude: 5 mV
Lead Channel Setting Pacing Amplitude: 1.125
Lead Channel Setting Pacing Amplitude: 2 V
Lead Channel Setting Pacing Pulse Width: 0.4 ms
Lead Channel Setting Sensing Sensitivity: 4 mV
Pulse Gen Model: 2272
Pulse Gen Serial Number: 3879105

## 2023-01-20 NOTE — Progress Notes (Signed)
Remote pacemaker transmission.   

## 2023-04-07 ENCOUNTER — Ambulatory Visit (INDEPENDENT_AMBULATORY_CARE_PROVIDER_SITE_OTHER): Payer: Medicare (Managed Care)

## 2023-04-07 DIAGNOSIS — I442 Atrioventricular block, complete: Secondary | ICD-10-CM

## 2023-04-08 LAB — CUP PACEART REMOTE DEVICE CHECK
Battery Remaining Longevity: 88 mo
Battery Remaining Percentage: 72 %
Battery Voltage: 2.99 V
Brady Statistic AP VP Percent: 21 %
Brady Statistic AP VS Percent: 1 %
Brady Statistic AS VP Percent: 79 %
Brady Statistic AS VS Percent: 1 %
Brady Statistic RA Percent Paced: 21 %
Brady Statistic RV Percent Paced: 99 %
Date Time Interrogation Session: 20241216020016
Implantable Lead Connection Status: 753985
Implantable Lead Connection Status: 753985
Implantable Lead Implant Date: 20211220
Implantable Lead Implant Date: 20211220
Implantable Lead Location: 753859
Implantable Lead Location: 753860
Implantable Pulse Generator Implant Date: 20211220
Lead Channel Impedance Value: 360 Ohm
Lead Channel Impedance Value: 540 Ohm
Lead Channel Pacing Threshold Amplitude: 0.75 V
Lead Channel Pacing Threshold Amplitude: 1 V
Lead Channel Pacing Threshold Pulse Width: 0.4 ms
Lead Channel Pacing Threshold Pulse Width: 0.4 ms
Lead Channel Sensing Intrinsic Amplitude: 12 mV
Lead Channel Sensing Intrinsic Amplitude: 5 mV
Lead Channel Setting Pacing Amplitude: 1.25 V
Lead Channel Setting Pacing Amplitude: 2 V
Lead Channel Setting Pacing Pulse Width: 0.4 ms
Lead Channel Setting Sensing Sensitivity: 4 mV
Pulse Gen Model: 2272
Pulse Gen Serial Number: 3879105

## 2023-05-12 NOTE — Addendum Note (Signed)
Addended by: Geralyn Flash D on: 05/12/2023 01:32 PM   Modules accepted: Orders

## 2023-05-12 NOTE — Progress Notes (Signed)
Remote pacemaker transmission.   

## 2023-07-07 ENCOUNTER — Ambulatory Visit (INDEPENDENT_AMBULATORY_CARE_PROVIDER_SITE_OTHER): Payer: Medicare (Managed Care)

## 2023-07-07 DIAGNOSIS — I442 Atrioventricular block, complete: Secondary | ICD-10-CM | POA: Diagnosis not present

## 2023-07-08 LAB — CUP PACEART REMOTE DEVICE CHECK
Battery Remaining Longevity: 87 mo
Battery Remaining Percentage: 70 %
Battery Voltage: 2.99 V
Brady Statistic AP VP Percent: 18 %
Brady Statistic AP VS Percent: 1 %
Brady Statistic AS VP Percent: 82 %
Brady Statistic AS VS Percent: 1 %
Brady Statistic RA Percent Paced: 18 %
Brady Statistic RV Percent Paced: 99 %
Date Time Interrogation Session: 20250317020014
Implantable Lead Connection Status: 753985
Implantable Lead Connection Status: 753985
Implantable Lead Implant Date: 20211220
Implantable Lead Implant Date: 20211220
Implantable Lead Location: 753859
Implantable Lead Location: 753860
Implantable Pulse Generator Implant Date: 20211220
Lead Channel Impedance Value: 390 Ohm
Lead Channel Impedance Value: 550 Ohm
Lead Channel Pacing Threshold Amplitude: 0.75 V
Lead Channel Pacing Threshold Amplitude: 0.875 V
Lead Channel Pacing Threshold Pulse Width: 0.4 ms
Lead Channel Pacing Threshold Pulse Width: 0.4 ms
Lead Channel Sensing Intrinsic Amplitude: 12 mV
Lead Channel Sensing Intrinsic Amplitude: 5 mV
Lead Channel Setting Pacing Amplitude: 1.125
Lead Channel Setting Pacing Amplitude: 2 V
Lead Channel Setting Pacing Pulse Width: 0.4 ms
Lead Channel Setting Sensing Sensitivity: 4 mV
Pulse Gen Model: 2272
Pulse Gen Serial Number: 3879105

## 2023-08-21 NOTE — Progress Notes (Signed)
 Remote pacemaker transmission.

## 2023-08-21 NOTE — Addendum Note (Signed)
 Addended by: Edra Govern D on: 08/21/2023 03:13 PM   Modules accepted: Orders

## 2023-10-06 ENCOUNTER — Ambulatory Visit (INDEPENDENT_AMBULATORY_CARE_PROVIDER_SITE_OTHER): Payer: Medicare (Managed Care)

## 2023-10-06 DIAGNOSIS — I442 Atrioventricular block, complete: Secondary | ICD-10-CM | POA: Diagnosis not present

## 2023-10-06 LAB — CUP PACEART REMOTE DEVICE CHECK
Battery Remaining Longevity: 84 mo
Battery Remaining Percentage: 68 %
Battery Voltage: 2.99 V
Brady Statistic AP VP Percent: 17 %
Brady Statistic AP VS Percent: 1 %
Brady Statistic AS VP Percent: 83 %
Brady Statistic AS VS Percent: 1 %
Brady Statistic RA Percent Paced: 16 %
Brady Statistic RV Percent Paced: 99 %
Date Time Interrogation Session: 20250616020014
Implantable Lead Connection Status: 753985
Implantable Lead Connection Status: 753985
Implantable Lead Implant Date: 20211220
Implantable Lead Implant Date: 20211220
Implantable Lead Location: 753859
Implantable Lead Location: 753860
Implantable Pulse Generator Implant Date: 20211220
Lead Channel Impedance Value: 410 Ohm
Lead Channel Impedance Value: 530 Ohm
Lead Channel Pacing Threshold Amplitude: 0.75 V
Lead Channel Pacing Threshold Amplitude: 0.875 V
Lead Channel Pacing Threshold Pulse Width: 0.4 ms
Lead Channel Pacing Threshold Pulse Width: 0.4 ms
Lead Channel Sensing Intrinsic Amplitude: 12 mV
Lead Channel Sensing Intrinsic Amplitude: 5 mV
Lead Channel Setting Pacing Amplitude: 1.125
Lead Channel Setting Pacing Amplitude: 2 V
Lead Channel Setting Pacing Pulse Width: 0.4 ms
Lead Channel Setting Sensing Sensitivity: 4 mV
Pulse Gen Model: 2272
Pulse Gen Serial Number: 3879105

## 2023-10-07 ENCOUNTER — Ambulatory Visit: Payer: Self-pay | Admitting: Cardiology

## 2023-11-13 NOTE — Progress Notes (Signed)
 Remote pacemaker transmission.

## 2024-01-05 ENCOUNTER — Ambulatory Visit (INDEPENDENT_AMBULATORY_CARE_PROVIDER_SITE_OTHER): Payer: Medicare (Managed Care)

## 2024-01-05 DIAGNOSIS — I442 Atrioventricular block, complete: Secondary | ICD-10-CM | POA: Diagnosis not present

## 2024-01-09 LAB — CUP PACEART REMOTE DEVICE CHECK
Battery Remaining Longevity: 82 mo
Battery Remaining Percentage: 65 %
Battery Voltage: 2.99 V
Brady Statistic AP VP Percent: 15 %
Brady Statistic AP VS Percent: 1 %
Brady Statistic AS VP Percent: 85 %
Brady Statistic AS VS Percent: 1 %
Brady Statistic RA Percent Paced: 15 %
Brady Statistic RV Percent Paced: 99 %
Date Time Interrogation Session: 20250918152515
Implantable Lead Connection Status: 753985
Implantable Lead Connection Status: 753985
Implantable Lead Implant Date: 20211220
Implantable Lead Implant Date: 20211220
Implantable Lead Location: 753859
Implantable Lead Location: 753860
Implantable Pulse Generator Implant Date: 20211220
Lead Channel Impedance Value: 400 Ohm
Lead Channel Impedance Value: 540 Ohm
Lead Channel Pacing Threshold Amplitude: 0.75 V
Lead Channel Pacing Threshold Amplitude: 0.75 V
Lead Channel Pacing Threshold Pulse Width: 0.4 ms
Lead Channel Pacing Threshold Pulse Width: 0.4 ms
Lead Channel Sensing Intrinsic Amplitude: 12 mV
Lead Channel Sensing Intrinsic Amplitude: 5 mV
Lead Channel Setting Pacing Amplitude: 1 V
Lead Channel Setting Pacing Amplitude: 2 V
Lead Channel Setting Pacing Pulse Width: 0.4 ms
Lead Channel Setting Sensing Sensitivity: 4 mV
Pulse Gen Model: 2272
Pulse Gen Serial Number: 3879105

## 2024-01-12 NOTE — Progress Notes (Signed)
 Remote PPM Transmission

## 2024-01-14 ENCOUNTER — Ambulatory Visit
Admission: RE | Admit: 2024-01-14 | Discharge: 2024-01-14 | Disposition: A | Discharge status: Home / Self Care | Payer: Medicare (Managed Care) | Source: Ambulatory Visit

## 2024-01-14 ENCOUNTER — Other Ambulatory Visit: Payer: Self-pay

## 2024-01-14 ENCOUNTER — Ambulatory Visit: Payer: Self-pay | Admitting: Cardiology

## 2024-01-14 DIAGNOSIS — M792 Neuralgia and neuritis, unspecified: Secondary | ICD-10-CM

## 2024-01-14 DIAGNOSIS — M6289 Other specified disorders of muscle: Secondary | ICD-10-CM

## 2024-04-05 ENCOUNTER — Ambulatory Visit: Payer: Medicare (Managed Care)

## 2024-04-05 DIAGNOSIS — I442 Atrioventricular block, complete: Secondary | ICD-10-CM | POA: Diagnosis not present

## 2024-04-06 LAB — CUP PACEART REMOTE DEVICE CHECK
Battery Remaining Longevity: 80 mo
Battery Remaining Percentage: 63 %
Battery Voltage: 2.98 V
Brady Statistic AP VP Percent: 15 %
Brady Statistic AP VS Percent: 1 %
Brady Statistic AS VP Percent: 85 %
Brady Statistic AS VS Percent: 1 %
Brady Statistic RA Percent Paced: 15 %
Brady Statistic RV Percent Paced: 99 %
Date Time Interrogation Session: 20251215020811
Implantable Lead Connection Status: 753985
Implantable Lead Connection Status: 753985
Implantable Lead Implant Date: 20211220
Implantable Lead Implant Date: 20211220
Implantable Lead Location: 753859
Implantable Lead Location: 753860
Implantable Pulse Generator Implant Date: 20211220
Lead Channel Impedance Value: 450 Ohm
Lead Channel Impedance Value: 590 Ohm
Lead Channel Pacing Threshold Amplitude: 0.75 V
Lead Channel Pacing Threshold Amplitude: 0.875 V
Lead Channel Pacing Threshold Pulse Width: 0.4 ms
Lead Channel Pacing Threshold Pulse Width: 0.4 ms
Lead Channel Sensing Intrinsic Amplitude: 12 mV
Lead Channel Sensing Intrinsic Amplitude: 5 mV
Lead Channel Setting Pacing Amplitude: 1.125
Lead Channel Setting Pacing Amplitude: 2 V
Lead Channel Setting Pacing Pulse Width: 0.4 ms
Lead Channel Setting Sensing Sensitivity: 4 mV
Pulse Gen Model: 2272
Pulse Gen Serial Number: 3879105

## 2024-04-09 ENCOUNTER — Ambulatory Visit: Payer: Self-pay | Admitting: Cardiology

## 2024-04-09 NOTE — Progress Notes (Signed)
 Remote PPM Transmission

## 2024-04-26 ENCOUNTER — Other Ambulatory Visit: Payer: Self-pay

## 2024-04-26 DIAGNOSIS — R519 Headache, unspecified: Secondary | ICD-10-CM

## 2024-04-28 ENCOUNTER — Other Ambulatory Visit: Payer: Self-pay

## 2024-04-28 ENCOUNTER — Encounter: Payer: Self-pay | Admitting: Orthopaedic Surgery

## 2024-04-28 DIAGNOSIS — G8929 Other chronic pain: Secondary | ICD-10-CM

## 2024-04-28 DIAGNOSIS — M542 Cervicalgia: Secondary | ICD-10-CM

## 2024-06-02 ENCOUNTER — Other Ambulatory Visit: Payer: Medicare (Managed Care)
